# Patient Record
Sex: Female | Born: 1955 | Race: White | Hispanic: No | Marital: Married | Smoking: Former smoker
Health system: Southern US, Community
[De-identification: ages and names within clinical notes are randomized; demographics above are authoritative.]

## PROBLEM LIST (undated history)

## (undated) DIAGNOSIS — F419 Anxiety disorder, unspecified: Secondary | ICD-10-CM

## (undated) DIAGNOSIS — R55 Syncope and collapse: Secondary | ICD-10-CM

## (undated) DIAGNOSIS — Z9289 Personal history of other medical treatment: Secondary | ICD-10-CM

## (undated) DIAGNOSIS — N959 Unspecified menopausal and perimenopausal disorder: Secondary | ICD-10-CM

## (undated) DIAGNOSIS — J302 Other seasonal allergic rhinitis: Secondary | ICD-10-CM

## (undated) DIAGNOSIS — E785 Hyperlipidemia, unspecified: Secondary | ICD-10-CM

## (undated) DIAGNOSIS — M8580 Other specified disorders of bone density and structure, unspecified site: Secondary | ICD-10-CM

## (undated) DIAGNOSIS — K219 Gastro-esophageal reflux disease without esophagitis: Secondary | ICD-10-CM

## (undated) DIAGNOSIS — T7840XA Allergy, unspecified, initial encounter: Secondary | ICD-10-CM

## (undated) DIAGNOSIS — R569 Unspecified convulsions: Secondary | ICD-10-CM

## (undated) HISTORY — DX: Gastro-esophageal reflux disease without esophagitis: K21.9

## (undated) HISTORY — DX: Personal history of other medical treatment: Z92.89

## (undated) HISTORY — DX: Anxiety disorder, unspecified: F41.9

## (undated) HISTORY — DX: Allergy, unspecified, initial encounter: T78.40XA

## (undated) HISTORY — PX: WISDOM TOOTH EXTRACTION: SHX21

## (undated) HISTORY — PX: PARTIAL HYSTERECTOMY: SHX80

## (undated) HISTORY — DX: Other seasonal allergic rhinitis: J30.2

## (undated) HISTORY — DX: Unspecified menopausal and perimenopausal disorder: N95.9

## (undated) HISTORY — DX: Hyperlipidemia, unspecified: E78.5

## (undated) HISTORY — DX: Other specified disorders of bone density and structure, unspecified site: M85.80

## (undated) HISTORY — DX: Syncope and collapse: R55

## (undated) HISTORY — PX: COLONOSCOPY: SHX174

## (undated) HISTORY — DX: Unspecified convulsions: R56.9

---
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
## 1985-08-03 HISTORY — PX: PELVIC LAPAROSCOPY: SHX162

## 1991-08-04 HISTORY — PX: TOTAL ABDOMINAL HYSTERECTOMY: SHX209

## 2005-05-27 ENCOUNTER — Ambulatory Visit: Payer: Self-pay | Admitting: Internal Medicine

## 2005-06-01 ENCOUNTER — Ambulatory Visit: Payer: Self-pay | Admitting: Internal Medicine

## 2005-09-11 ENCOUNTER — Ambulatory Visit: Payer: Self-pay | Admitting: Infectious Diseases

## 2007-06-08 ENCOUNTER — Ambulatory Visit: Payer: Self-pay | Admitting: Internal Medicine

## 2007-10-06 ENCOUNTER — Ambulatory Visit: Payer: Self-pay | Admitting: Internal Medicine

## 2007-10-21 ENCOUNTER — Ambulatory Visit: Payer: Self-pay | Admitting: Internal Medicine

## 2008-02-22 ENCOUNTER — Encounter: Payer: Self-pay | Admitting: Cardiology

## 2008-02-22 ENCOUNTER — Ambulatory Visit: Payer: Self-pay

## 2009-03-22 ENCOUNTER — Ambulatory Visit: Payer: Self-pay | Admitting: Internal Medicine

## 2009-04-01 ENCOUNTER — Ambulatory Visit: Payer: Self-pay | Admitting: Cardiology

## 2009-04-02 DIAGNOSIS — E785 Hyperlipidemia, unspecified: Secondary | ICD-10-CM | POA: Insufficient documentation

## 2009-04-02 DIAGNOSIS — R000 Tachycardia, unspecified: Secondary | ICD-10-CM | POA: Insufficient documentation

## 2009-04-03 HISTORY — PX: OTHER SURGICAL HISTORY: SHX169

## 2009-04-10 ENCOUNTER — Ambulatory Visit: Payer: Self-pay | Admitting: Cardiology

## 2009-04-10 ENCOUNTER — Other Ambulatory Visit: Payer: Self-pay | Admitting: Cardiology

## 2009-04-10 ENCOUNTER — Encounter (INDEPENDENT_AMBULATORY_CARE_PROVIDER_SITE_OTHER): Payer: Self-pay | Admitting: General Surgery

## 2009-04-15 ENCOUNTER — Ambulatory Visit: Payer: Self-pay | Admitting: Cardiology

## 2009-08-03 HISTORY — PX: ANTERIOR CRUCIATE LIGAMENT REPAIR: SHX115

## 2009-10-05 LAB — HM DEXA SCAN

## 2009-10-29 ENCOUNTER — Ambulatory Visit: Payer: Self-pay | Admitting: Orthopedic Surgery

## 2009-10-30 ENCOUNTER — Ambulatory Visit: Payer: Self-pay | Admitting: Internal Medicine

## 2010-01-17 ENCOUNTER — Ambulatory Visit: Payer: Self-pay | Admitting: Orthopedic Surgery

## 2010-01-29 ENCOUNTER — Ambulatory Visit: Payer: Self-pay | Admitting: Orthopedic Surgery

## 2011-03-02 ENCOUNTER — Encounter: Payer: Self-pay | Admitting: Cardiology

## 2011-05-05 ENCOUNTER — Ambulatory Visit (INDEPENDENT_AMBULATORY_CARE_PROVIDER_SITE_OTHER): Payer: BC Managed Care – PPO

## 2011-05-05 DIAGNOSIS — Z23 Encounter for immunization: Secondary | ICD-10-CM

## 2011-05-15 ENCOUNTER — Ambulatory Visit (INDEPENDENT_AMBULATORY_CARE_PROVIDER_SITE_OTHER): Payer: BC Managed Care – PPO | Admitting: Internal Medicine

## 2011-05-15 ENCOUNTER — Encounter: Payer: Self-pay | Admitting: Internal Medicine

## 2011-05-15 VITALS — BP 133/86 | HR 93 | Temp 98.6°F | Resp 16 | Ht 63.0 in | Wt 128.5 lb

## 2011-05-15 DIAGNOSIS — J069 Acute upper respiratory infection, unspecified: Secondary | ICD-10-CM

## 2011-05-15 MED ORDER — AMOXICILLIN-POT CLAVULANATE 200-28.5 MG PO CHEW: 1.0000 | CHEWABLE_TABLET | Freq: Two times a day (BID) | ORAL | Status: DC

## 2011-05-15 MED ORDER — AMOXICILLIN-POT CLAVULANATE 875-125 MG PO TABS: 1.0000 | ORAL_TABLET | Freq: Two times a day (BID) | ORAL | Status: AC

## 2011-05-15 MED ORDER — BENZONATATE 200 MG PO CAPS: 200.0000 mg | ORAL_CAPSULE | Freq: Three times a day (TID) | ORAL | Status: AC | PRN

## 2011-05-15 NOTE — Patient Instructions (Signed)
You can take benadryl at bedtime with your tessalon and your sudafed.  The tessalon can be taken every 8 hours. Avoid dairywhile sick  Can produce extra mucuous.   A probiotic may help prevent diarrhea from the augmentin (antibiotic)

## 2011-05-15 NOTE — Progress Notes (Signed)
  Subjective:    Patient ID: Michele Hess, female    DOB: 10/26/1955, 55 y.o.   MRN: [id number]  HPI  10 day history of sore throat naccompaneid by facial pressure, sinus congestion , persistent cough, discolored nasal drainage and general malaise.,  Sick contacts include her grandchild who is living woth her and attends dayschool.  NO recent travel.  Past Medical History  Diagnosis Date  . History of ETT     myoview 7/09: 9 min 31 sec, EF 71%, nov evidence for ischemia. ETT 99/10)" 1030", excellent exercise tolerance, no abnormal rhythms, no evidence for ischemia  . HLD (hyperlipidemia)    Current Outpatient Prescriptions on File Prior to Visit  Medication Sig Dispense Refill  . Calcium-Vitamin D 600-125 MG-UNIT TABS Take by mouth 2 (two) times daily.        . fexofenadine (ALLEGRA) 180 MG tablet Take 180 mg by mouth daily.        . Multiple Vitamins-Iron (MULTIVITAMIN/IRON) TABS Take by mouth daily.        . NON FORMULARY Estriol 1 mg/ Testosterone .5 mg Vag tab: twice weekly       . NON FORMULARY PROGESTERONE CREAM 5% WITH DHEA 10MG /ML: 1/2 mL 25 days/month       . Omega-3 Fatty Acids (FISH OIL) 1200 MG CAPS Take 2 capsules by mouth daily.            Review of Systems  Constitutional: Negative for fever, chills and unexpected weight change.  HENT: Negative for hearing loss, ear pain, nosebleeds, congestion, sore throat, facial swelling, rhinorrhea, sneezing, mouth sores, trouble swallowing, neck pain, neck stiffness, voice change, postnasal drip, sinus pressure, tinnitus and ear discharge.   Eyes: Negative for pain, discharge, redness and visual disturbance.  Respiratory: Negative for cough, chest tightness, shortness of breath, wheezing and stridor.   Cardiovascular: Negative for chest pain, palpitations and leg swelling.  Musculoskeletal: Negative for myalgias and arthralgias.  Skin: Negative for color change and rash.  Neurological: Negative for dizziness, weakness,  light-headedness and headaches.  Hematological: Negative for adenopathy.       Objective:   Physical Exam  Constitutional: She is oriented to person, place, and time. She appears well-developed and well-nourished.  HENT:  Left Ear: Tympanic membrane is bulging.  Nose: Mucosal edema and rhinorrhea present.  Mouth/Throat: Oropharynx is clear and moist.  Eyes: EOM are normal. Pupils are equal, round, and reactive to light. No scleral icterus.  Neck: Normal range of motion. Neck supple. No JVD present. No thyromegaly present.  Cardiovascular: Normal rate, regular rhythm, normal heart sounds and intact distal pulses.   Pulmonary/Chest: Effort normal and breath sounds normal.  Abdominal: Soft. Bowel sounds are normal. She exhibits no mass. There is no tenderness.  Musculoskeletal: Normal range of motion. She exhibits no edema.  Lymphadenopathy:    She has no cervical adenopathy.  Neurological: She is alert and oriented to person, place, and time.  Skin: Skin is warm and dry.  Psychiatric: She has a normal mood and affect.          Assessment & Plan:  URI:  Given persistence of symptoms for 10 days accompanied by findings of bulging TM,  Will treat with augmentin for sinusitis/otitis, along with decongestant and cough suppressant.

## 2011-09-08 ENCOUNTER — Encounter: Payer: Self-pay | Admitting: Internal Medicine

## 2011-09-09 ENCOUNTER — Encounter: Payer: Self-pay | Admitting: Internal Medicine

## 2011-10-06 ENCOUNTER — Ambulatory Visit (INDEPENDENT_AMBULATORY_CARE_PROVIDER_SITE_OTHER): Payer: BC Managed Care – PPO | Admitting: Internal Medicine

## 2011-10-06 VITALS — BP 102/64 | HR 87 | Temp 97.8°F | Resp 16 | Wt 131.8 lb

## 2011-10-06 DIAGNOSIS — R5383 Other fatigue: Secondary | ICD-10-CM

## 2011-10-06 DIAGNOSIS — E782 Mixed hyperlipidemia: Secondary | ICD-10-CM

## 2011-10-06 DIAGNOSIS — N390 Urinary tract infection, site not specified: Secondary | ICD-10-CM

## 2011-10-06 LAB — POCT URINALYSIS DIPSTICK
Bilirubin, UA: NEGATIVE
Glucose, UA: NEGATIVE
Nitrite, UA: NEGATIVE
Urobilinogen, UA: 0.2

## 2011-10-06 MED ORDER — SULFAMETHOXAZOLE-TRIMETHOPRIM 800-160 MG PO TABS: 1.0000 | ORAL_TABLET | Freq: Two times a day (BID) | ORAL | Status: AC

## 2011-10-06 NOTE — Progress Notes (Signed)
Subjective:    Patient ID: Michele Hess, female    DOB: 06-13-1956, 56 y.o.   MRN: [id number]  HPI  Michele Hess is a healthy active 56 yo menopausal woman on Vaginal estrogen  For postmenopasual atrophic vaginitis who presents with 2 day history of dysuria and frequency.  Denies fevers, chills, back pain and N/V.  No gross blood.   Past Medical History  Diagnosis Date  . History of ETT     myoview 7/09: 9 min 31 sec, EF 71%, nov evidence for ischemia. ETT 99/10)" 1030", excellent exercise tolerance, no abnormal rhythms, no evidence for ischemia  . HLD (hyperlipidemia)    Current Outpatient Prescriptions on File Prior to Visit  Medication Sig Dispense Refill  . Calcium-Vitamin D 600-125 MG-UNIT TABS Take by mouth 2 (two) times daily.        . fexofenadine (ALLEGRA) 180 MG tablet Take 180 mg by mouth daily.        . Multiple Vitamins-Iron (MULTIVITAMIN/IRON) TABS Take by mouth daily.        . NON FORMULARY Estriol 1 mg/ Testosterone .5 mg Vag tab: twice weekly       . NON FORMULARY PROGESTERONE CREAM 5% WITH DHEA 10MG /ML: 1/2 mL 25 days/month       . Omega-3 Fatty Acids (FISH OIL) 1200 MG CAPS Take 2 capsules by mouth daily.            Review of Systems  Constitutional: Negative for fever, chills and unexpected weight change.  HENT: Negative for hearing loss, ear pain, nosebleeds, congestion, sore throat, facial swelling, rhinorrhea, sneezing, mouth sores, trouble swallowing, neck pain, neck stiffness, voice change, postnasal drip, sinus pressure, tinnitus and ear discharge.   Eyes: Negative for pain, discharge, redness and visual disturbance.  Respiratory: Negative for cough, chest tightness, shortness of breath, wheezing and stridor.   Cardiovascular: Negative for chest pain, palpitations and leg swelling.  Genitourinary: Positive for dysuria and urgency.  Musculoskeletal: Negative for myalgias and arthralgias.  Skin: Negative for color change and rash.  Neurological: Negative for  dizziness, weakness, light-headedness and headaches.  Hematological: Negative for adenopathy.       Objective:   Physical Exam  Constitutional: She appears well-developed and well-nourished.  Neck: Normal range of motion. Neck supple.  Cardiovascular: Normal rate and regular rhythm.   Pulmonary/Chest: Effort normal and breath sounds normal.  Abdominal: Soft. Bowel sounds are normal.       Assessment & Plan:   Urinary tract infection Will treat empirically with Septra.  Avoiding ciprofloxacin  given patients daily workouts.  HYPERLIPIDEMIA, MIXED Mild., managed with fish oil and diet.  she will return this summer for repeat fasting labs    Updated Medication List Outpatient Encounter Prescriptions as of 10/06/2011  Medication Sig Dispense Refill  . Calcium-Vitamin D 600-125 MG-UNIT TABS Take by mouth 2 (two) times daily.        . fexofenadine (ALLEGRA) 180 MG tablet Take 180 mg by mouth daily.        . Multiple Vitamins-Iron (MULTIVITAMIN/IRON) TABS Take by mouth daily.        . NON FORMULARY Estriol 1 mg/ Testosterone .5 mg Vag tab: twice weekly       . NON FORMULARY PROGESTERONE CREAM 5% WITH DHEA 10MG /ML: 1/2 mL 25 days/month       . Omega-3 Fatty Acids (FISH OIL) 1200 MG CAPS Take 2 capsules by mouth daily.        Marland Kitchen sulfamethoxazole-trimethoprim (SEPTRA DS) 800-160 MG per  tablet Take 1 tablet by mouth 2 (two) times daily.  6 tablet  0

## 2011-10-07 NOTE — Assessment & Plan Note (Signed)
Will treat empirically with Septra.  Avoiding ciprofloxacin  given patients daily workouts.

## 2011-10-07 NOTE — Assessment & Plan Note (Addendum)
Mild., managed with fish oil and diet.  she will return this summer for repeat fasting labs

## 2011-10-09 LAB — URINE CULTURE: Colony Count: >=100000

## 2011-12-07 ENCOUNTER — Other Ambulatory Visit: Payer: Self-pay | Admitting: Internal Medicine

## 2012-02-02 ENCOUNTER — Telehealth: Payer: Self-pay | Admitting: Internal Medicine

## 2012-02-02 DIAGNOSIS — Z1239 Encounter for other screening for malignant neoplasm of breast: Secondary | ICD-10-CM

## 2012-02-02 DIAGNOSIS — E785 Hyperlipidemia, unspecified: Secondary | ICD-10-CM

## 2012-02-02 DIAGNOSIS — Z79899 Other long term (current) drug therapy: Secondary | ICD-10-CM

## 2012-02-02 NOTE — Telephone Encounter (Signed)
I have faxed order to [facility] for her mammogram.

## 2012-02-02 NOTE — Telephone Encounter (Signed)
Orders in EPIC.  Pls schedule mammogram

## 2012-02-02 NOTE — Telephone Encounter (Signed)
spoke with Anne Hahn at [facility]/July 18 at 8:30--02-02-12 left msg with spouse asking to have patient return my call.

## 2012-02-02 NOTE — Telephone Encounter (Signed)
When she calls back I will also try to schedule her lab appointment with her.

## 2012-02-02 NOTE — Telephone Encounter (Signed)
Patient returned your call she was notified of her appointment date with [facility] for her mammogram and scheduled her lab appointment for her physical.

## 2012-02-02 NOTE — Telephone Encounter (Signed)
Patient would like to know if she could get her lab work and mammogram done before her physical on Aug. 8.  She uses [facility] for her mammogram.

## 2012-02-23 ENCOUNTER — Encounter: Payer: Self-pay | Admitting: Internal Medicine

## 2012-03-02 ENCOUNTER — Other Ambulatory Visit: Payer: BC Managed Care – PPO

## 2012-03-04 ENCOUNTER — Other Ambulatory Visit (INDEPENDENT_AMBULATORY_CARE_PROVIDER_SITE_OTHER): Payer: BC Managed Care – PPO | Admitting: *Deleted

## 2012-03-04 DIAGNOSIS — R5383 Other fatigue: Secondary | ICD-10-CM

## 2012-03-04 DIAGNOSIS — R5381 Other malaise: Secondary | ICD-10-CM

## 2012-03-04 DIAGNOSIS — E782 Mixed hyperlipidemia: Secondary | ICD-10-CM

## 2012-03-04 DIAGNOSIS — Z79899 Other long term (current) drug therapy: Secondary | ICD-10-CM

## 2012-03-04 LAB — CBC WITH DIFFERENTIAL/PLATELET
Basophils Absolute: 0 10*3/uL (ref 0.0–0.1)
Basophils Relative: 0.5 % (ref 0.0–3.0)
Eosinophils Relative: 1.4 % (ref 0.0–5.0)
Hemoglobin: 14 g/dL (ref 12.0–15.0)
Lymphocytes Relative: 39.2 % (ref 12.0–46.0)
Lymphs Abs: 2 10*3/uL (ref 0.7–4.0)
MCHC: 33.5 g/dL (ref 30.0–36.0)
MCV: 92.1 fl (ref 78.0–100.0)
Neutro Abs: 2.6 10*3/uL (ref 1.4–7.7)
Neutrophils Relative %: 49.9 % (ref 43.0–77.0)
RBC: 4.55 Mil/uL (ref 3.87–5.11)
WBC: 5.2 10*3/uL (ref 4.5–10.5)

## 2012-03-04 LAB — LIPID PANEL
Cholesterol: 196 mg/dL (ref 0–200)
HDL: 65.8 mg/dL (ref >39.00)
LDL Cholesterol: 109 mg/dL — ABNORMAL HIGH (ref 0–99)
Total CHOL/HDL Ratio: 3
Triglycerides: 106 mg/dL (ref 0.0–149.0)
VLDL: 21.2 mg/dL (ref 0.0–40.0)

## 2012-03-05 LAB — COMPLETE METABOLIC PANEL WITH GFR
ALT: <8 U/L (ref 0–35)
AST: 19 U/L (ref 0–37)
BUN: 21 mg/dL (ref 6–23)
Calcium: 9.5 mg/dL (ref 8.4–10.5)
Creat: 0.67 mg/dL (ref 0.50–1.10)
Total Bilirubin: 0.5 mg/dL (ref 0.3–1.2)

## 2012-03-07 ENCOUNTER — Other Ambulatory Visit: Payer: BC Managed Care – PPO

## 2012-03-09 ENCOUNTER — Encounter: Payer: BC Managed Care – PPO | Admitting: Internal Medicine

## 2012-03-11 ENCOUNTER — Ambulatory Visit (INDEPENDENT_AMBULATORY_CARE_PROVIDER_SITE_OTHER): Payer: BC Managed Care – PPO | Admitting: Internal Medicine

## 2012-03-11 ENCOUNTER — Encounter: Payer: Self-pay | Admitting: Internal Medicine

## 2012-03-11 VITALS — BP 132/78 | HR 65 | Temp 98.0°F | Resp 16 | Wt 128.2 lb

## 2012-03-11 DIAGNOSIS — N959 Unspecified menopausal and perimenopausal disorder: Secondary | ICD-10-CM

## 2012-03-11 DIAGNOSIS — T781XXA Other adverse food reactions, not elsewhere classified, initial encounter: Secondary | ICD-10-CM

## 2012-03-11 DIAGNOSIS — Z0000 Encounter for general adult medical examination without abnormal findings: Secondary | ICD-10-CM

## 2012-03-11 DIAGNOSIS — Z124 Encounter for screening for malignant neoplasm of cervix: Secondary | ICD-10-CM

## 2012-03-11 DIAGNOSIS — Z91018 Allergy to other foods: Secondary | ICD-10-CM

## 2012-03-11 DIAGNOSIS — T783XXA Angioneurotic edema, initial encounter: Secondary | ICD-10-CM

## 2012-03-11 DIAGNOSIS — E782 Mixed hyperlipidemia: Secondary | ICD-10-CM

## 2012-03-11 NOTE — Patient Instructions (Addendum)
Consider a Low Glycemic Index Diet and eating 6 smaller meals daily .  This frequent feeding stimulates your metabolism and the lower glycemic index foods will lower your blood sugars:   This is an example of my daily  "Low GI"  Diet:  All of the foods can be found at grocery stores and in bulk at [employer]  club   7 AM Breakfast:  Low carbohydrate Protein  Shakes (I recommend the EAS AdvantEdge "Carb Control" shakes  Or the low carb shakes by Atkins.   Both are available everywhere:  In  cases at [employer]  Or in 4 packs at grocery stores and pharmacies  2.5 carbs  (Alternative is  a toasted Arnold's Sandwhich Thin w/ peanut butter, a "Bagel Thin" with cream cheese and salmon) or  a scrambled egg burrito made with a low carb tortilla .  Avoid cereal and bananas, oatmeal too unless the old fashioned kind that takes 30-40 minutes to prepare.  the rest is overly processed, has minimal fiber, and loaded with carbohydrates!   10 AM: Protein bar by Atkins (the snack size, under 200 cal.  There are many varieties , available widely again or in bulk in limited varieties at [employer]Js)  Other so called "protein bars" tend to be loaded with carbohydrates.  Remember, in food advertising, the word "energy" is synonymous for " carbohydrate."  Lunch: sandwich of turkey, (or any lunchmeat or canned tuna), fresh avocado and cheese on a lower carbohydrate pita bread, flatbread, or tortilla . Ok to use mayonnaise. The bread is the only source or carbohydrate that can be decreased (Joseph's makes a pita bread and a flat bread  Are 50 cal and 4 net carbs ; Toufayan makes a low carb flatbread 100 cal and 9 net carbs  and  Mission makes a low carb whole wheat tortilla  210 cal and 6 net carbs)  3 PM:  Mid day :  Another protein bar,  Or a  cheese stick (100 cal, 0 carbs),  Or 1 ounce of  almonds, walnuts, pistachios, pecans, peanuts,  Macadamia nuts. Or a Dannon light n Fit greek yogurt, 80 cal 8 net carbs . Avoid "granola"; the dried  cranberries and raisins are loaded with carbohydrates.    6 PM  Dinner:  "mean and green:"  Meat/chicken/fish or a high protein legume; , with a green salad, and a low GI  Veggie (broccoli, cauliflower, green beans, spinach, brussel sprouts. Lima beans) : Avoid "Low fat dressings, [place] and [place]! They are loaded with sugar! Instead use ranch, vinagrette,  Blue cheese, etc  9 PM snack : Breyer's "low carb" fudgsicle or  ice cream bar (Carb Smart line), or  Weight Watcher's ice cream bar , or anouther "no sugar added" ice cream; or another protein shake or a serving of fresh fruit with whipped cream (Avoid bananas, pineapple, grapes  and watermelon on a regular basis because they are high in sugar)   Remember that snack Substitutions should be less than 15 to 20 carbs  Per serving. Remember to subtract fiber grams to get the "net carbs." 

## 2012-03-11 NOTE — Progress Notes (Signed)
Patient ID: Michele Hess, female   DOB: 1956/01/05, 56 y.o.   MRN: [id number]  Subjective:    Michele Hess is a 56 y.o. female who presents for an annual exam.  S/p TAH,  But wasn't sure prior to exam if she had a cervix  Followup on hyperlipidemia..  Recent labs done,  LDL has dropped from from 578 to 106 attributed to exercise program, She is  training for a run. ,  Mammogram normal per patient July 2013,  Still waiting for report. Had annual derm exam..  Skin tag taken off under right axilla.   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 It has never occurred at home. Only at restaurants. She felt that her throat was closing up however she did not have trouble breathing. She's had no prior allergy testing. The patient is sexually active. GYN screening history: last pap: patient does not recall when last pap was. The patient wears seatbelts: yes. The patient participates in regular exercise: yes. Has the patient ever been transfused or tattooed?: no. The patient reports that there is not domestic violence in her life.   Menstrual History: OB History    Grav Para Term Preterm Abortions TAB SAB Ect Mult Living                  Menarche age: 68 No LMP recorded. Patient has had a hysterectomy.    The following portions of the patient's history were reviewed and updated as appropriate: allergies, current medications, past family history, past medical history, past social history, past surgical history and problem list.  Review of Systems A comprehensive review of systems was negative except for: Allergic/Immunologic: positive for angioedema    Objective:    BP 132/78  Pulse 65  Temp 98 F (36.7 C) (Oral)  Resp 16  Wt 128 lb 4 oz (58.174 kg)  SpO2 96%  General Appearance:    Alert, cooperative, no distress, appears stated age  Head:    Normocephalic, without obvious abnormality, atraumatic  Eyes:     PERRL, conjunctiva/corneas clear, EOM's intact, fundi    benign, both eyes  Ears:    Normal TM's and external ear canals, both ears  Nose:   Nares normal, septum midline, mucosa normal, no drainage    or sinus tenderness  Throat:   Lips, mucosa, and tongue normal; teeth and gums normal  Neck:   Supple, symmetrical, trachea midline, no adenopathy;    thyroid:  no enlargement/tenderness/nodules; no carotid   bruit or JVD  Back:     Symmetric, no curvature, ROM normal, no CVA tenderness  Lungs:     Clear to auscultation bilaterally, respirations unlabored  Chest Wall:    No tenderness or deformity   Heart:    Regular rate and rhythm, S1 and S2 normal, no murmur, rub   or gallop  Breast Exam:    No tenderness, masses, or nipple abnormality  Abdomen:     Soft, non-tender, bowel sounds active all four quadrants,    no masses, no organomegaly  Genitalia:    Normal female without lesion, discharge or tenderness. Cervix absent, ovaries nonpalpable.     Extremities:   Extremities normal, atraumatic, no cyanosis or edema  Pulses:   2+ and symmetric all extremities  Skin:   Skin color, texture, turgor normal, no rashes or lesions  Lymph nodes:   Cervical, supraclavicular, and axillary nodes  normal  Neurologic:   CNII-XII intact, normal strength, sensation and reflexes    throughout  .    Assessment:   Menopausal and postmenopausal disorder Management bioidentical hormone therapy which I have continued to refill but not adjust.  HYPERLIPIDEMIA, MIXED Excellent reduction in LDL from 160 to 100 with diet and exercise.  Routine general medical examination at a health care facility Breast pelvic were done today.  She has no cervix, so no PAP was done.   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 I recommended that she or eating spicy foods until she can have her allergy food allergies tested. I  have recommended that she take a Zyrtec or Allegra before she goes out to eat as it certainly would not hurt.   Updated Medication List Outpatient Encounter Prescriptions as of 03/11/2012  Medication Sig Dispense Refill  . Calcium-Vitamin D 600-125 MG-UNIT TABS Take by mouth 2 (two) times daily.        . fexofenadine (ALLEGRA) 180 MG tablet Take 180 mg by mouth daily.        . Multiple Vitamins-Iron (MULTIVITAMIN/IRON) TABS Take by mouth daily.        . NON FORMULARY Estriol 1 mg/ Testosterone .5 mg Vag tab: twice weekly       . NON FORMULARY PROGESTERONE CREAM 5% WITH DHEA 10MG /ML: 1/2 mL 25 days/month       . Omega-3 Fatty Acids (FISH OIL) 1200 MG CAPS Take 2 capsules by mouth daily.

## 2012-03-13 ENCOUNTER — Encounter: Payer: Self-pay | Admitting: Internal Medicine

## 2012-03-13 DIAGNOSIS — T783XXA Angioneurotic edema, initial encounter: Secondary | ICD-10-CM | POA: Insufficient documentation

## 2012-03-13 NOTE — Assessment & Plan Note (Signed)
Breast Pap and pelvic were done today

## 2012-03-13 NOTE — Assessment & Plan Note (Signed)
Management bioidentical hormone therapy which I have continued to refill but not adjust.

## 2012-03-13 NOTE — Assessment & Plan Note (Signed)
She has describes several episodes of allergic reaction to spicy peppers which involve a feeling of her throat closing up. I recommended that she or eating spicy foods until she can have her allergy food allergies tested. I have recommended that she take a Zyrtec or Allegra before she goes out to eat as it certainly would not hurt.

## 2012-03-13 NOTE — Assessment & Plan Note (Signed)
Excellent reduction in LDL from 160 to 100 with diet and exercise.

## 2012-03-14 ENCOUNTER — Other Ambulatory Visit (HOSPITAL_COMMUNITY)
Admission: RE | Admit: 2012-03-14 | Discharge: 2012-03-14 | Disposition: A | Discharge status: Home / Self Care | Payer: BC Managed Care – PPO | Source: Ambulatory Visit | Attending: Internal Medicine | Admitting: Internal Medicine

## 2012-03-14 DIAGNOSIS — Z1151 Encounter for screening for human papillomavirus (HPV): Secondary | ICD-10-CM | POA: Insufficient documentation

## 2012-03-14 DIAGNOSIS — Z01419 Encounter for gynecological examination (general) (routine) without abnormal findings: Secondary | ICD-10-CM | POA: Insufficient documentation

## 2012-03-14 NOTE — Addendum Note (Signed)
Addended by: Jobie Quaker on: 03/14/2012 09:26 AM   Modules accepted: Orders

## 2012-03-23 LAB — HM PAP SMEAR: HM Pap smear: NORMAL

## 2012-04-11 ENCOUNTER — Other Ambulatory Visit: Payer: Self-pay | Admitting: Internal Medicine

## 2012-04-26 ENCOUNTER — Other Ambulatory Visit: Payer: Self-pay | Admitting: *Deleted

## 2012-04-26 MED ORDER — FEXOFENADINE HCL 180 MG PO TABS: 180.0000 mg | ORAL_TABLET | Freq: Every day | ORAL | Status: DC

## 2012-04-27 ENCOUNTER — Telehealth: Payer: Self-pay | Admitting: Internal Medicine

## 2012-04-27 MED ORDER — FEXOFENADINE HCL 180 MG PO TABS: 180.0000 mg | ORAL_TABLET | Freq: Every day | ORAL | Status: DC

## 2012-04-27 NOTE — Telephone Encounter (Signed)
[facility] Fexofenadine 180 mg tab 30 each prescribed refills 7 date written 08-31-10 take 1 tablet every day date last filled 06-10-11

## 2012-05-09 ENCOUNTER — Encounter: Payer: Self-pay | Admitting: Internal Medicine

## 2012-07-07 ENCOUNTER — Other Ambulatory Visit: Payer: Self-pay

## 2012-07-07 NOTE — Telephone Encounter (Signed)
Entered in error

## 2012-07-11 ENCOUNTER — Telehealth: Payer: Self-pay

## 2012-07-11 NOTE — Telephone Encounter (Signed)
Refill request from [facility] for E3 1mg  + Test 0.5 mg vag. Ok to refill?

## 2012-07-11 NOTE — Telephone Encounter (Signed)
Yes, it is a compound so best to refill it verbally with 11 refills

## 2012-07-13 ENCOUNTER — Other Ambulatory Visit: Payer: Self-pay

## 2012-07-13 NOTE — Telephone Encounter (Signed)
Left message for patient to call me back on clarification on where to send Rx to.

## 2012-07-14 ENCOUNTER — Other Ambulatory Visit: Payer: Self-pay

## 2012-09-17 ENCOUNTER — Other Ambulatory Visit: Payer: Self-pay

## 2013-02-20 LAB — HM MAMMOGRAPHY: HM MAMMO: NORMAL

## 2013-02-28 ENCOUNTER — Telehealth: Payer: Self-pay | Admitting: *Deleted

## 2013-02-28 NOTE — Telephone Encounter (Signed)
Refill Request  E3 1mg +Test 0.5 mg Vag tab  Insert one tablet vaginally twice a week

## 2013-03-01 NOTE — Telephone Encounter (Signed)
Last OV 8/13 and last PAP 8/13 please advise as to refill?

## 2013-03-01 NOTE — Telephone Encounter (Signed)
Ok to refill for 30 days and 1 refills

## 2013-03-08 NOTE — Telephone Encounter (Signed)
Called in.

## 2013-03-14 ENCOUNTER — Telehealth: Payer: Self-pay | Admitting: *Deleted

## 2013-03-14 NOTE — Telephone Encounter (Signed)
Patient would like to know if she is due any labs at this time.

## 2013-03-15 NOTE — Telephone Encounter (Signed)
No, her labs were all normal last year and she is nto taking any medications that I know of that require monitoring

## 2013-03-15 NOTE — Telephone Encounter (Signed)
Pt notified on personalized voicemail.  

## 2013-04-23 LAB — HM MAMMOGRAPHY: HM Mammogram: NORMAL

## 2013-05-05 ENCOUNTER — Encounter: Payer: Self-pay | Admitting: Internal Medicine

## 2013-06-06 ENCOUNTER — Ambulatory Visit (INDEPENDENT_AMBULATORY_CARE_PROVIDER_SITE_OTHER): Payer: BC Managed Care – PPO | Admitting: Internal Medicine

## 2013-06-06 ENCOUNTER — Encounter: Payer: Self-pay | Admitting: Internal Medicine

## 2013-06-06 ENCOUNTER — Telehealth: Payer: Self-pay | Admitting: Internal Medicine

## 2013-06-06 VITALS — BP 118/70 | HR 78 | Temp 98.0°F | Wt 130.0 lb

## 2013-06-06 DIAGNOSIS — R300 Dysuria: Secondary | ICD-10-CM

## 2013-06-06 DIAGNOSIS — E559 Vitamin D deficiency, unspecified: Secondary | ICD-10-CM

## 2013-06-06 DIAGNOSIS — M8580 Other specified disorders of bone density and structure, unspecified site: Secondary | ICD-10-CM

## 2013-06-06 DIAGNOSIS — M25552 Pain in left hip: Secondary | ICD-10-CM

## 2013-06-06 DIAGNOSIS — M25559 Pain in unspecified hip: Secondary | ICD-10-CM

## 2013-06-06 DIAGNOSIS — R5381 Other malaise: Secondary | ICD-10-CM

## 2013-06-06 DIAGNOSIS — M899 Disorder of bone, unspecified: Secondary | ICD-10-CM

## 2013-06-06 LAB — POCT URINALYSIS DIPSTICK
Glucose, UA: NEGATIVE
Ketones, UA: NEGATIVE
Nitrite, UA: NEGATIVE
Spec Grav, UA: <=1.005
Urobilinogen, UA: 0.2

## 2013-06-06 MED ORDER — METHOCARBAMOL 500 MG PO TABS: 500.0000 mg | ORAL_TABLET | Freq: Four times a day (QID) | ORAL | Status: DC

## 2013-06-06 NOTE — Telephone Encounter (Signed)
Pt called thinks she has it and wanted to come by to leave urine sample She has side pain   Discoloration of urine

## 2013-06-06 NOTE — Patient Instructions (Addendum)
You have no signs of UTI   You can take up to 800 mg ibuprofen every 8 hours if needed for pain , you can add 500 mg tylenol (acetominophen) every 6 hours (maximum 2000 mg daily) for better pain control.  We will order your bone density test , your last one was March 2011 and your  T scores were in the moderate osteopenia range  You can add a muscle relaxer if needed  You should take 2000 units of vitamin d through the winter months,  And 1000 units during the rest of the year (unless your level is low)

## 2013-06-06 NOTE — Telephone Encounter (Signed)
Dr. Darrick Huntsman will see today at 4:30

## 2013-06-06 NOTE — Progress Notes (Signed)
Patient ID: Michele Hess, female   DOB: May 22, 1956, 57 y.o.   MRN: [id number]   Patient Active Problem List   Diagnosis Date Noted  . Left hip pain 06/07/2013  . Osteopenia 06/07/2013  . 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 Routine general medical examination at a health care facility 03/11/2012  . Menopausal and postmenopausal disorder   . HYPERLIPIDEMIA, MIXED 04/02/2009  . TACHYCARDIA 04/02/2009    Subjective:  CC:   Chief Complaint  Patient presents with  . Urinary Tract Infection    HPI:   Michele Hess a 57 y.o. female who presents for Left CVA pain intermittent for the last month. She requested treatment for UTI and was offered an appt for evaluation.  Has been having left hip pain and recently developed intermittent lateral back pain .  She is running over 6 miles daily   Training for a half marathon,  History of djd left hip noted by chiropractor.    Past Medical History  Diagnosis Date  . History of ETT     myoview 7/09: 9 min 31 sec, EF 71%, nov evidence for ischemia. ETT 99/10)" 1030", excellent exercise tolerance, no abnormal rhythms, no evidence for ischemia  . HLD (hyperlipidemia)   . Menopausal and postmenopausal disorder     Past Surgical History  Procedure Laterality Date  . Partial hysterectomy    . Echo (other)  9/10    EF 60%, lv normal, RV normal, mild MR, trivial pericardial effusion   . Total abdominal hysterectomy  1993    [facility]       The following portions of the patient's history were reviewed and updated as appropriate: Allergies, current medications, and problem list.    Review of Systems:   12 Pt  review of systems was negative except those addressed in the HPI,     History   Social History  . Marital Status: Married    Spouse Name: N/A    Number of Children: N/A  . Years of Education: N/A   Occupational History  . Not on file.   Social History Main Topics  . Smoking status: Former Smoker    Quit date: 05/14/2009   . Smokeless tobacco: Never Used     Comment: quit 15 years ago   . Alcohol Use: Yes     Comment: rare  . Drug Use: No  . Sexual Activity: Yes    Birth Control/ Protection: Post-menopausal   Other Topics Concern  . 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 Lives in [place].     Objective:  Filed Vitals:   06/06/13 1620  BP: 118/70  Pulse: 78  Temp: 98 F (36.7 C)     General appearance: alert, cooperative and appears stated age Ears: normal TM's and external ear canals both ears Throat: lips, mucosa, and tongue normal; teeth and gums normal Neck: no adenopathy, no carotid bruit, supple, symmetrical, trachea midline and thyroid not enlarged, symmetric, no tenderness/mass/nodules Back: symmetric, no curvature. ROM normal. No CVA tenderness. Lungs: clear to auscultation bilaterally Heart: regular rate and rhythm, S1, S2 normal, no murmur, click, rub or gallop Abdomen: soft, non-tender; bowel sounds normal; no masses,  no organomegaly Pulses: 2+ and symmetric Skin: Skin color, texture, turgor normal. No rashes or lesions Lymph nodes: Cervical, supraclavicular, and axillary nodes normal.  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  Ibuprofen and tylenol prn.  Muscle relaxer prn   Osteopenia t scores -2.0 in 2011.   Repeat DEXA ordered.   A total of 30 minutes was spent with patient more than half of which was spent in counseling, reviewing records from other prviders and coordination of care.  Updated Medication List Outpatient Encounter Prescriptions as of 06/06/2013  Medication Sig  . Calcium-Vitamin D 600-125 MG-UNIT TABS Take by mouth 2 (two) times daily.    . Multiple Vitamins-Iron (MULTIVITAMIN/IRON) TABS Take by mouth daily.    . NON FORMULARY Estriol 1 mg/ Testosterone .5 mg Vag tab: twice weekly   . NON FORMULARY  PROGESTERONE CREAM 5% WITH DHEA 10MG /ML: 1/2 mL 25 days/month   . Omega-3 Fatty Acids (FISH OIL) 1200 MG CAPS Take 2 capsules by mouth daily.    . methocarbamol (ROBAXIN) 500 MG tablet Take 1 tablet (500 mg total) by mouth 4 (four) times daily.  . [DISCONTINUED] fexofenadine (ALLEGRA) 180 MG tablet Take 1 tablet (180 mg total) by mouth daily.     Orders Placed This Encounter  Procedures  . DG Bone Density  . HM DEXA SCAN  . TSH  . Comprehensive metabolic panel  . Vit D  25 hydroxy (rtn osteoporosis monitoring)  . POCT urinalysis dipstick    No Follow-up on file.

## 2013-06-07 ENCOUNTER — Encounter: Payer: Self-pay | Admitting: Internal Medicine

## 2013-06-07 DIAGNOSIS — M25552 Pain in left hip: Secondary | ICD-10-CM | POA: Insufficient documentation

## 2013-06-07 DIAGNOSIS — M8580 Other specified disorders of bone density and structure, unspecified site: Secondary | ICD-10-CM | POA: Insufficient documentation

## 2013-06-07 LAB — COMPREHENSIVE METABOLIC PANEL
Alkaline Phosphatase: 46 U/L (ref 39–117)
BUN: 18 mg/dL (ref 6–23)
CO2: 28 mEq/L (ref 19–32)
Creatinine, Ser: 0.7 mg/dL (ref 0.4–1.2)
GFR: 96.61 mL/min (ref >60.00)
Glucose, Bld: 97 mg/dL (ref 70–99)
Potassium: 4.1 mEq/L (ref 3.5–5.1)
Sodium: 138 mEq/L (ref 135–145)
Total Bilirubin: 0.5 mg/dL (ref 0.3–1.2)
Total Protein: 7.9 g/dL (ref 6.0–8.3)

## 2013-06-07 LAB — VITAMIN D 25 HYDROXY (VIT D DEFICIENCY, FRACTURES): Vit D, 25-Hydroxy: 71 ng/mL (ref 30–89)

## 2013-06-07 LAB — TSH: TSH: 1.2 u[IU]/mL (ref 0.35–5.50)

## 2013-06-07 NOTE — Assessment & Plan Note (Signed)
t scores -2.0 in 2011.   Repeat DEXA ordered.

## 2013-06-07 NOTE — Assessment & Plan Note (Addendum)
Lenghty discuss about etiology of her low back pain and hip pain Secondary to DJD aggravated by running.  Ibuprofen and tylenol prn.  Muscle relaxer prn

## 2013-06-08 ENCOUNTER — Encounter: Payer: Self-pay | Admitting: Internal Medicine

## 2013-06-08 ENCOUNTER — Other Ambulatory Visit: Payer: Self-pay

## 2013-07-05 ENCOUNTER — Ambulatory Visit: Payer: Self-pay | Admitting: Internal Medicine

## 2013-07-07 ENCOUNTER — Telehealth: Payer: Self-pay | Admitting: *Deleted

## 2013-07-07 NOTE — Telephone Encounter (Signed)
Refill Request    E3 1mg  +Test 0.5 mg vag tab

## 2013-07-07 NOTE — Telephone Encounter (Signed)
Phoned to pharmacy 

## 2013-07-13 ENCOUNTER — Telehealth: Payer: Self-pay | Admitting: Internal Medicine

## 2013-07-13 DIAGNOSIS — M8580 Other specified disorders of bone density and structure, unspecified site: Secondary | ICD-10-CM

## 2013-07-13 LAB — HM DEXA SCAN

## 2013-07-13 NOTE — Telephone Encounter (Signed)
Patient notified

## 2013-07-13 NOTE — Telephone Encounter (Signed)
DEXA scan received.  Osteopenia is improving,  Continue current medications/supplements and repeat in 2 years

## 2013-07-24 ENCOUNTER — Telehealth: Payer: Self-pay | Admitting: *Deleted

## 2013-07-24 ENCOUNTER — Telehealth: Payer: Self-pay | Admitting: Internal Medicine

## 2013-07-24 NOTE — Telephone Encounter (Signed)
Pt requesting refill of her compounded cream: Progesterone 5%/DHEA 10 mg/mL HRT #30 Apply 1/2 mL daily to twice a day 25 days each month  Ok to refill?

## 2013-07-24 NOTE — Telephone Encounter (Signed)
Patient called in after talking with Becky this morning about her refill request. She states this is a constant issues that she faces when it comes to her compound prescription. She was advised to call the pharmacy to have them fax over the request for this medication. She states that they told her they had manually faxed to [place] last week and they were happy to fax again however the patient said "NO" if it has already been faxed I will call and contact the office manager. I have called [facility] at [phone] and spoke with Sue Lush and she is going to fax this prescription attn: shannon so we can get this taken care of. When this refill request gets sent over please handle today so patient can get her cream before the holiday.

## 2013-07-24 NOTE — Telephone Encounter (Signed)
Pt left VM, requesting refill on her compounded cream to [facility]. Left message for pt, to contact pharmacy and request refill request to be sent to our office.

## 2013-07-25 NOTE — Telephone Encounter (Signed)
Yes ok to call in refills on the compounded cream.  thanks

## 2013-07-25 NOTE — Telephone Encounter (Signed)
Called to pharmacy 

## 2013-08-14 ENCOUNTER — Encounter: Payer: Self-pay | Admitting: Internal Medicine

## 2013-10-17 ENCOUNTER — Ambulatory Visit (INDEPENDENT_AMBULATORY_CARE_PROVIDER_SITE_OTHER): Payer: BC Managed Care – PPO | Admitting: Internal Medicine

## 2013-10-17 ENCOUNTER — Encounter: Payer: Self-pay | Admitting: Internal Medicine

## 2013-10-17 VITALS — BP 128/78 | HR 77 | Temp 97.4°F | Resp 16 | Wt 132.2 lb

## 2013-10-17 DIAGNOSIS — N390 Urinary tract infection, site not specified: Secondary | ICD-10-CM

## 2013-10-17 LAB — POCT URINALYSIS DIPSTICK
BILIRUBIN UA: NEGATIVE
Glucose, UA: NEGATIVE
KETONES UA: NEGATIVE
Nitrite, UA: NEGATIVE
PROTEIN UA: NEGATIVE
Spec Grav, UA: 1.01
Urobilinogen, UA: 0.2
pH, UA: 7

## 2013-10-17 MED ORDER — SULFAMETHOXAZOLE-TRIMETHOPRIM 800-160 MG PO TABS: 1.0000 | ORAL_TABLET | Freq: Two times a day (BID) | ORAL | Status: DC

## 2013-10-17 NOTE — Progress Notes (Signed)
Pre-visit discussion using our clinic review tool. No additional management support is needed unless otherwise documented below in the visit note.  

## 2013-10-17 NOTE — Progress Notes (Signed)
Patient ID: Michele Hess, female   DOB: 1955-08-08, 58 y.o.   MRN: [id number]  Patient Active Problem List   Diagnosis Date Noted  . UTI (urinary tract infection) 10/17/2013  . Left hip pain 06/07/2013  . Osteopenia 06/07/2013  . 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 Routine general medical examination at a health care facility 03/11/2012  . Menopausal and postmenopausal disorder   . HYPERLIPIDEMIA, MIXED 04/02/2009  . TACHYCARDIA 04/02/2009    Subjective:  CC:   Chief Complaint  Patient presents with  . Acute Visit    Burning constant  . Urinary Tract Infection    HPI:   Michele Hess is a 58 y.o. female who presents for  Dysuria.  Symptoms started 2 nights ago , improved transiently with increased water  But recurred after 24 hours now worse.  Not accompanied by nausea,  Fevers or back pain.  Has not taken antibiotics in the last 3 months. No recent long trips, use of swimming pools or hot tubs.      Past Medical History  Diagnosis Date  . History of ETT     myoview 7/09: 9 min 31 sec, EF 71%, nov evidence for ischemia. ETT 99/10)" 1030", excellent exercise tolerance, no abnormal rhythms, no evidence for ischemia  . HLD (hyperlipidemia)   . Menopausal and postmenopausal disorder     Past Surgical History  Procedure Laterality Date  . Partial hysterectomy    . Echo (other)  9/10    EF 60%, lv normal, RV normal, mild MR, trivial pericardial effusion   . Total abdominal hysterectomy  1993    Michele Hess       The following portions of the patient's history were reviewed and updated as appropriate: Allergies, current medications, and problem list.    Review of Systems:   Patient denies headache, fevers, malaise, unintentional weight loss, skin rash, eye pain, sinus congestion and sinus pain, sore throat, dysphagia,  hemoptysis , cough, dyspnea, wheezing, chest pain, palpitations, orthopnea, edema, abdominal pain, nausea, melena, diarrhea, constipation, flank pain,  dysuria, hematuria, urinary  Frequency, nocturia, numbness, tingling, seizures,  Focal weakness, Loss of consciousness,  Tremor, insomnia, depression, anxiety, and suicidal ideation.     History   Social History  . Marital Status: Married    Spouse Name: N/A    Number of Children: N/A  . Years of Education: N/A   Occupational History  . Not on file.   Social History Main Topics  . Smoking status: Former Smoker    Quit date: 05/15/1987  . Smokeless tobacco: Never Used     Comment: quit 27 years ago   . Alcohol Use: Yes     Comment: rare  . Drug Use: No  . Sexual Activity: Yes    Birth Control/ Protection: Post-menopausal   Other Topics Concern  . 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 Lives in [place].     Objective:  Filed Vitals:   10/17/13 1329  BP: 128/78  Pulse: 77  Temp: 97.4 F (36.3 C)  Resp: 16     General appearance: alert, cooperative and appears stated age Ears: normal TM's and external ear canals both ears Throat: lips, mucosa, and tongue normal; teeth and gums normal Neck: no adenopathy, no carotid bruit, supple, symmetrical, trachea midline and thyroid not enlarged, symmetric, no tenderness/mass/nodules Back: symmetric, no curvature. ROM normal. No CVA tenderness. Lungs: clear to auscultation bilaterally Heart: regular rate and rhythm, S1,  S2 normal, no murmur, click, rub or gallop Abdomen: soft, non-tender; bowel sounds normal; no masses,  no organomegaly Pulses: 2+ and symmetric Skin: Skin color, texture, turgor normal. No rashes or lesions Lymph nodes: Cervical, supraclavicular, and axillary nodes normal.  Assessment and Plan:  UTI (urinary tract infection) Empiric septra was prescribed, pending finalization of urine culture.  There is no evidence of UTI by culture results.  If she is still having symptoms she will make appt for pelvic exam    Updated Medication List Outpatient Encounter  Prescriptions as of 10/17/2013  Medication Sig  . Calcium-Vitamin D 600-125 MG-UNIT TABS Take by mouth 2 (two) times daily.    . Multiple Vitamins-Iron (MULTIVITAMIN/IRON) TABS Take by mouth daily.    . NON FORMULARY Estriol 1 mg/ Testosterone .5 mg Vag tab: twice weekly   . NON FORMULARY PROGESTERONE CREAM 5% WITH DHEA 10MG /ML: 1/2 mL 25 days/month   . Omega-3 Fatty Acids (FISH OIL) 1200 MG CAPS Take 2 capsules by mouth daily.    . methocarbamol (ROBAXIN) 500 MG tablet Take 1 tablet (500 mg total) by mouth 4 (four) times daily.  Marland Kitchen sulfamethoxazole-trimethoprim (SEPTRA DS) 800-160 MG per tablet Take 1 tablet by mouth 2 (two) times daily.     Orders Placed This Encounter  Procedures  . Urine culture  . HM MAMMOGRAPHY  . HM MAMMOGRAPHY  . HM PAP SMEAR  . POCT urinalysis dipstick    No Follow-up on file.

## 2013-10-18 LAB — URINE CULTURE
Colony Count: NO GROWTH
Organism ID, Bacteria: NO GROWTH

## 2013-10-19 ENCOUNTER — Encounter: Payer: Self-pay | Admitting: Internal Medicine

## 2013-10-21 ENCOUNTER — Encounter: Payer: Self-pay | Admitting: Internal Medicine

## 2013-10-21 NOTE — Assessment & Plan Note (Signed)
Empiric septra was prescribed, pending finalization of urine culture.  There is no evidence of UTI by culture results.  If she is still having symptoms she will make appt for pelvic exam

## 2013-10-30 ENCOUNTER — Telehealth: Payer: Self-pay | Admitting: Internal Medicine

## 2013-10-30 NOTE — Telephone Encounter (Signed)
Can we re-code this or do I need to send to [facility]

## 2013-10-30 NOTE — Telephone Encounter (Signed)
Pt called, states she had her repeat bone density done in December.  States she is receiving a bill that it is not being covered.  States she has spoken with her insurance who said it should have been covered.  Pt states it was coded as diagnostic, but should have been a follow up to make sure levels were not going lower than the previous test had shown.  Pt would like this to be looked in to as she feels it should have been paid for.

## 2013-10-31 NOTE — Telephone Encounter (Signed)
Please advise 

## 2013-10-31 NOTE — Telephone Encounter (Signed)
She has osteopenia and it was coded as such.  Send to [facility].  Whatever she wants to do.

## 2013-11-15 NOTE — Telephone Encounter (Signed)
Michele Hess please see below. Will you please look at this bill for the patient?

## 2014-01-31 ENCOUNTER — Telehealth: Payer: Self-pay | Admitting: *Deleted

## 2014-01-31 NOTE — Telephone Encounter (Signed)
Received fax from [facility], requesting refill on E3 1 mg + Test 0.5 mg vaginal tablet. Insert one tablet vaginally twice weekly. Ok refill?

## 2014-01-31 NOTE — Telephone Encounter (Signed)
Called refills to pharmacy #[id number] with 3 refills

## 2014-01-31 NOTE — Telephone Encounter (Signed)
yes please refill via phone since there is no EPIC  Equivalent.

## 2014-03-29 ENCOUNTER — Encounter: Payer: Self-pay | Admitting: Internal Medicine

## 2014-03-29 ENCOUNTER — Ambulatory Visit (INDEPENDENT_AMBULATORY_CARE_PROVIDER_SITE_OTHER): Payer: BC Managed Care – PPO | Admitting: Internal Medicine

## 2014-03-29 VITALS — BP 132/74 | HR 96 | Temp 97.6°F | Resp 16 | Ht 63.0 in | Wt 129.8 lb

## 2014-03-29 DIAGNOSIS — L678 Other hair color and hair shaft abnormalities: Secondary | ICD-10-CM

## 2014-03-29 DIAGNOSIS — L738 Other specified follicular disorders: Secondary | ICD-10-CM

## 2014-03-29 MED ORDER — KETOCONAZOLE 2 % EX CREA: 1.0000 "application " | TOPICAL_CREAM | Freq: Every day | CUTANEOUS | Status: DC

## 2014-03-29 MED ORDER — HYDROXYZINE HCL 25 MG PO TABS: 25.0000 mg | ORAL_TABLET | Freq: Three times a day (TID) | ORAL | Status: DC | PRN

## 2014-03-29 NOTE — Patient Instructions (Addendum)
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

## 2014-03-29 NOTE — Progress Notes (Signed)
Pre-visit discussion using our clinic review tool. No additional management support is needed unless otherwise documented below in the visit note.  

## 2014-03-29 NOTE — Assessment & Plan Note (Addendum)
Recurring annually with itching.  Prior treatment with selsun blue shampoo.  Offered rx for nizoral cream ,,  Hydroxyzine oral tid for itching

## 2014-03-29 NOTE — Progress Notes (Signed)
Patient ID: Michele Hess, female   DOB: 15-Apr-1956, 58 y.o.   MRN: [id number]   Patient Active Problem List   Diagnosis Date Noted  . 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 UTI (urinary tract infection) 10/17/2013  . Left hip pain 06/07/2013  . Osteopenia 06/07/2013  . 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 Routine general medical examination at a health care facility 03/11/2012  . Menopausal and postmenopausal disorder   . HYPERLIPIDEMIA, MIXED 04/02/2009  . TACHYCARDIA 04/02/2009    Subjective:  CC:   Chief Complaint  Patient presents with  . Rash    Since monday on torso and chest. itches.    HPI:   Michele Hess is a 58 y.o. female who presents for evaluation of   Red itchy skin rash started on Sunday night under her left breast and sternal area, but has now spread to the area under her right breast.    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    Past Medical History  Diagnosis Date  . History of ETT     myoview 7/09: 9 min 31 sec, EF 71%, nov evidence for ischemia. ETT 99/10)" 1030", excellent exercise tolerance, no abnormal rhythms, no evidence for ischemia  . HLD (hyperlipidemia)   . Menopausal and postmenopausal disorder     Past Surgical History  Procedure Laterality Date  . Partial hysterectomy    . Echo (other)  9/10    EF 60%, lv normal, RV normal, mild MR, trivial pericardial effusion   . Total abdominal hysterectomy  1993    [name]       The following portions of the patient's history were reviewed and updated as appropriate: Allergies, current medications, and problem list.    Review of Systems:   Patient denies headache, fevers, malaise, unintentional weight loss, skin rash, eye pain, sinus congestion and sinus pain,  sore throat, dysphagia,  hemoptysis , cough, dyspnea, wheezing, chest pain, palpitations, orthopnea, edema, abdominal pain, nausea, melena, diarrhea, constipation, flank pain, dysuria, hematuria, urinary  Frequency, nocturia, numbness, tingling, seizures,  Focal weakness, Loss of consciousness,  Tremor, insomnia, depression, anxiety, and suicidal ideation.     History   Social History  . Marital Status: Married    Spouse Name: N/A    Number of Children: N/A  . Years of Education: N/A   Occupational History  . Not on file.   Social History Main Topics  . Smoking status: Former Smoker    Quit date: 05/15/1987  . Smokeless tobacco: Never Used     Comment: quit 27 years ago   . Alcohol Use: Yes     Comment: rare  . Drug Use: No  . Sexual Activity: Yes    Birth Control/ Protection: Post-menopausal   Other Topics Concern  . 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     Objective:  Filed Vitals:   03/29/14 1104  BP: 132/74  Pulse: 96  Temp: 97.6 F (36.4 C)  Resp: 16     General appearance: alert, cooperative and appears stated age Neck: no adenopathy, no carotid bruit, supple, symmetrical, trachea midline and thyroid not enlarged, symmetric, no tenderness/mass/nodules Back: symmetric, no curvature. ROM normal. No CVA tenderness. Lungs: clear  to auscultation bilaterally Heart: regular rate and rhythm, S1, S2 normal, no murmur, click, rub or gallop Abdomen: soft, non-tender; bowel sounds normal; no masses,  no organomegaly Pulses: 2+ and symmetric Skin: dermatiform pattern of red papular skin lesions under both breasts and on sternum.  Lymph nodes: Cervical, supraclavicular, and axillary nodes normal.  Assessment and Plan:  Pityrosporum folliculitis Recurring annually with itching.  Prior treatment with selsun blue shampoo.  Offered rx for nizoral cream ,,  Hydroxyzine oral tid for itching    Updated  Medication List Outpatient Encounter Prescriptions as of 03/29/2014  Medication Sig  . Calcium-Vitamin D 600-125 MG-UNIT TABS Take by mouth 2 (two) times daily.    . Multiple Vitamins-Iron (MULTIVITAMIN/IRON) TABS Take by mouth daily.    . NON FORMULARY Estriol 1 mg/ Testosterone .5 mg Vag tab: twice weekly   . NON FORMULARY PROGESTERONE CREAM 5% WITH DHEA 10MG /ML: 1/2 mL 25 days/month   . Omega-3 Fatty Acids (FISH OIL) 1200 MG CAPS Take 2 capsules by mouth daily.    . hydrOXYzine (ATARAX/VISTARIL) 25 MG tablet Take 1 tablet (25 mg total) by mouth 3 (three) times daily as needed.  Marland Kitchen ketoconazole (NIZORAL) 2 % cream Apply 1 application topically daily.  . methocarbamol (ROBAXIN) 500 MG tablet Take 1 tablet (500 mg total) by mouth 4 (four) times daily.  Marland Kitchen sulfamethoxazole-trimethoprim (SEPTRA DS) 800-160 MG per tablet Take 1 tablet by mouth 2 (two) times daily.     No orders of the defined types were placed in this encounter.    No Follow-up on file.

## 2014-04-01 ENCOUNTER — Encounter: Payer: Self-pay | Admitting: Internal Medicine

## 2014-05-21 ENCOUNTER — Encounter: Payer: Self-pay | Admitting: Internal Medicine

## 2014-05-21 ENCOUNTER — Telehealth: Payer: Self-pay | Admitting: Internal Medicine

## 2014-05-21 DIAGNOSIS — E782 Mixed hyperlipidemia: Secondary | ICD-10-CM

## 2014-05-21 DIAGNOSIS — R5383 Other fatigue: Secondary | ICD-10-CM

## 2014-05-21 DIAGNOSIS — M8580 Other specified disorders of bone density and structure, unspecified site: Secondary | ICD-10-CM

## 2014-05-21 NOTE — Telephone Encounter (Signed)
The patient is scheduled for her physical on 12.11.15 . She is wanting to come in for labs prior . Is this ok, if so please enter labs.

## 2014-05-21 NOTE — Telephone Encounter (Signed)
Fasting labs added

## 2014-05-21 NOTE — Telephone Encounter (Signed)
Please advise 

## 2014-05-22 NOTE — Telephone Encounter (Signed)
Patient aware of lab date?

## 2014-05-24 LAB — HM MAMMOGRAPHY: HM MAMMO: NORMAL

## 2014-05-30 ENCOUNTER — Encounter: Payer: Self-pay | Admitting: Internal Medicine

## 2014-06-05 ENCOUNTER — Encounter: Payer: Self-pay | Admitting: Internal Medicine

## 2014-07-11 ENCOUNTER — Other Ambulatory Visit (INDEPENDENT_AMBULATORY_CARE_PROVIDER_SITE_OTHER): Payer: BC Managed Care – PPO

## 2014-07-11 DIAGNOSIS — E782 Mixed hyperlipidemia: Secondary | ICD-10-CM

## 2014-07-11 DIAGNOSIS — M8580 Other specified disorders of bone density and structure, unspecified site: Secondary | ICD-10-CM

## 2014-07-11 DIAGNOSIS — R5383 Other fatigue: Secondary | ICD-10-CM

## 2014-07-11 LAB — COMPREHENSIVE METABOLIC PANEL
ALK PHOS: 48 U/L (ref 39–117)
ALT: 10 U/L (ref 0–35)
AST: 22 U/L (ref 0–37)
Albumin: 4.4 g/dL (ref 3.5–5.2)
BUN: 14 mg/dL (ref 6–23)
CO2: 28 mEq/L (ref 19–32)
Calcium: 9.2 mg/dL (ref 8.4–10.5)
Chloride: 103 mEq/L (ref 96–112)
Creatinine, Ser: 0.7 mg/dL (ref 0.4–1.2)
GFR: 94.61 mL/min (ref >60.00)
Glucose, Bld: 104 mg/dL — ABNORMAL HIGH (ref 70–99)
Potassium: 4.8 mEq/L (ref 3.5–5.1)
SODIUM: 139 meq/L (ref 135–145)
TOTAL PROTEIN: 7.1 g/dL (ref 6.0–8.3)
Total Bilirubin: 0.4 mg/dL (ref 0.2–1.2)

## 2014-07-11 LAB — CBC WITH DIFFERENTIAL/PLATELET
BASOS ABS: 0 10*3/uL (ref 0.0–0.1)
Basophils Relative: 0.5 % (ref 0.0–3.0)
Eosinophils Absolute: 0.1 10*3/uL (ref 0.0–0.7)
Eosinophils Relative: 1.3 % (ref 0.0–5.0)
HEMATOCRIT: 41.4 % (ref 36.0–46.0)
Hemoglobin: 13.7 g/dL (ref 12.0–15.0)
Lymphocytes Relative: 44.9 % (ref 12.0–46.0)
Lymphs Abs: 2.2 10*3/uL (ref 0.7–4.0)
MCHC: 33.2 g/dL (ref 30.0–36.0)
MCV: 90 fl (ref 78.0–100.0)
MONO ABS: 0.4 10*3/uL (ref 0.1–1.0)
Monocytes Relative: 8.5 % (ref 3.0–12.0)
NEUTROS PCT: 44.8 % (ref 43.0–77.0)
Neutro Abs: 2.2 10*3/uL (ref 1.4–7.7)
PLATELETS: 295 10*3/uL (ref 150.0–400.0)
RBC: 4.6 Mil/uL (ref 3.87–5.11)
RDW: 13 % (ref 11.5–15.5)
WBC: 4.8 10*3/uL (ref 4.0–10.5)

## 2014-07-11 LAB — LIPID PANEL
CHOL/HDL RATIO: 3
Cholesterol: 234 mg/dL — ABNORMAL HIGH (ref 0–200)
HDL: 74.6 mg/dL (ref >39.00)
LDL CALC: 149 mg/dL — AB (ref 0–99)
NONHDL: 159.4
Triglycerides: 53 mg/dL (ref 0.0–149.0)
VLDL: 10.6 mg/dL (ref 0.0–40.0)

## 2014-07-11 LAB — VITAMIN D 25 HYDROXY (VIT D DEFICIENCY, FRACTURES): VITD: 41.89 ng/mL (ref 30.00–100.00)

## 2014-07-11 LAB — TSH: TSH: 2.95 u[IU]/mL (ref 0.35–4.50)

## 2014-07-13 ENCOUNTER — Encounter: Payer: Self-pay | Admitting: Internal Medicine

## 2014-07-13 ENCOUNTER — Ambulatory Visit (INDEPENDENT_AMBULATORY_CARE_PROVIDER_SITE_OTHER): Payer: BC Managed Care – PPO | Admitting: Internal Medicine

## 2014-07-13 VITALS — BP 120/88 | HR 74 | Temp 97.4°F | Resp 14 | Ht 63.0 in | Wt 132.5 lb

## 2014-07-13 DIAGNOSIS — Z0000 Encounter for general adult medical examination without abnormal findings: Secondary | ICD-10-CM

## 2014-07-13 NOTE — Progress Notes (Signed)
Pre-visit discussion using our clinic review tool. No additional management support is needed unless otherwise documented below in the visit note.  

## 2014-07-13 NOTE — Patient Instructions (Addendum)
You had your annual  wellness exam today.  We will repeat your PAP smear in 2016, sooner if needed  Health Maintenance Adopting a healthy lifestyle and getting preventive care can go a long way to promote health and wellness. Talk with your health care provider about what schedule of regular examinations is right for you. This is a good chance for you to check in with your provider about disease prevention and staying healthy. In between checkups, there are plenty of things you can do on your own. Experts have done a lot of research about which lifestyle changes and preventive measures are most likely to keep you healthy. Ask your health care provider for more information. WEIGHT AND DIET  Eat a healthy diet  Be sure to include plenty of vegetables, fruits, low-fat dairy products, and lean protein.  Do not eat a lot of foods high in solid fats, added sugars, or salt.  Get regular exercise. This is one of the most important things you can do for your health.  Most adults should exercise for at least 150 minutes each week. The exercise should increase your heart rate and make you sweat (moderate-intensity exercise).  Most adults should also do strengthening exercises at least twice a week. This is in addition to the moderate-intensity exercise.  Maintain a healthy weight  Body mass index (BMI) is a measurement that can be used to identify possible weight problems. It estimates body fat based on height and weight. Your health care provider can help determine your BMI and help you achieve or maintain a healthy weight.  For females 49 years of age and older:   A BMI below 18.5 is considered underweight.  A BMI of 18.5 to 24.9 is normal.  A BMI of 25 to 29.9 is considered overweight.  A BMI of 30 and above is considered obese.  Watch levels of cholesterol and blood lipids  You should start having your blood tested for lipids and cholesterol at 57 years of age, then have this test every 5  years.  You may need to have your cholesterol levels checked more often if:  Your lipid or cholesterol levels are high.  You are older than 57 years of age.  You are at high risk for heart disease.  CANCER SCREENING   Lung Cancer  Lung cancer screening is recommended for adults 62-62 years old who are at high risk for lung cancer because of a history of smoking.  A yearly low-dose CT scan of the lungs is recommended for people who:  Currently smoke.  Have quit within the past 15 years.  Have at least a 30-pack-year history of smoking. A pack year is smoking an average of one pack of cigarettes a day for 1 year.  Yearly screening should continue until it has been 15 years since you quit.  Yearly screening should stop if you develop a health problem that would prevent you from having lung cancer treatment.  Breast Cancer  Practice breast self-awareness. This means understanding how your breasts normally appear and feel.  It also means doing regular breast self-exams. Let your health care provider know about any changes, no matter how small.  If you are in your 20s or 30s, you should have a clinical breast exam (CBE) by a health care provider every 1-3 years as part of a regular health exam.  If you are 46 or older, have a CBE every year. Also consider having a breast X-ray (mammogram) every year.  If  you have a family history of breast cancer, talk to your health care provider about genetic screening.  If you are at high risk for breast cancer, talk to your health care provider about having an MRI and a mammogram every year.  Breast cancer gene (BRCA) assessment is recommended for women who have family members with BRCA-related cancers. BRCA-related cancers include:  Breast.  Ovarian.  Tubal.  Peritoneal cancers.  Results of the assessment will determine the need for genetic counseling and BRCA1 and BRCA2 testing. Cervical Cancer Routine pelvic examinations to  screen for cervical cancer are no longer recommended for nonpregnant women who are considered low risk for cancer of the pelvic organs (ovaries, uterus, and vagina) and who do not have symptoms. A pelvic examination may be necessary if you have symptoms including those associated with pelvic infections. Ask your health care provider if a screening pelvic exam is right for you.   The Pap test is the screening test for cervical cancer for women who are considered at risk.  If you had a hysterectomy for a problem that was not cancer or a condition that could lead to cancer, then you no longer need Pap tests.  If you are older than 65 years, and you have had normal Pap tests for the past 10 years, you no longer need to have Pap tests.  If you have had past treatment for cervical cancer or a condition that could lead to cancer, you need Pap tests and screening for cancer for at least 20 years after your treatment.  If you no longer get a Pap test, assess your risk factors if they change (such as having a new sexual partner). This can affect whether you should start being screened again.  Some women have medical problems that increase their chance of getting cervical cancer. If this is the case for you, your health care provider may recommend more frequent screening and Pap tests.  The human papillomavirus (HPV) test is another test that may be used for cervical cancer screening. The HPV test looks for the virus that can cause cell changes in the cervix. The cells collected during the Pap test can be tested for HPV.  The HPV test can be used to screen women 10 years of age and older. Getting tested for HPV can extend the interval between normal Pap tests from three to five years.  An HPV test also should be used to screen women of any age who have unclear Pap test results.  After 57 years of age, women should have HPV testing as often as Pap tests.  Colorectal Cancer  This type of cancer can be  detected and often prevented.  Routine colorectal cancer screening usually begins at 57 years of age and continues through 57 years of age.  Your health care provider may recommend screening at an earlier age if you have risk factors for colon cancer.  Your health care provider may also recommend using home test kits to check for hidden blood in the stool.  A small camera at the end of a tube can be used to examine your colon directly (sigmoidoscopy or colonoscopy). This is done to check for the earliest forms of colorectal cancer.  Routine screening usually begins at age 83.  Direct examination of the colon should be repeated every 5-10 years through 57 years of age. However, you may need to be screened more often if early forms of precancerous polyps or small growths are found. Skin Cancer  Check your skin from head to toe regularly.  Tell your health care provider about any new moles or changes in moles, especially if there is a change in a mole's shape or color.  Also tell your health care provider if you have a mole that is larger than the size of a pencil eraser.  Always use sunscreen. Apply sunscreen liberally and repeatedly throughout the day.  Protect yourself by wearing long sleeves, pants, a wide-brimmed hat, and sunglasses whenever you are outside. HEART DISEASE, DIABETES, AND HIGH BLOOD PRESSURE   Have your blood pressure checked at least every 1-2 years. High blood pressure causes heart disease and increases the risk of stroke.  If you are between 51 years and 36 years old, ask your health care provider if you should take aspirin to prevent strokes.  Have regular diabetes screenings. This involves taking a blood sample to check your fasting blood sugar level.  If you are at a normal weight and have a low risk for diabetes, have this test once every three years after 57 years of age.  If you are overweight and have a high risk for diabetes, consider being tested at a  younger age or more often. PREVENTING INFECTION  Hepatitis B  If you have a higher risk for hepatitis B, you should be screened for this virus. You are considered at high risk for hepatitis B if:  You were born in a country where hepatitis B is common. Ask your health care provider which countries are considered high risk.  Your parents were born in a high-risk country, and you have not been immunized against hepatitis B (hepatitis B vaccine).  You have HIV or AIDS.  You use needles to inject street drugs.  You live with someone who has hepatitis B.  You have had sex with someone who has hepatitis B.  You get hemodialysis treatment.  You take certain medicines for conditions, including cancer, organ transplantation, and autoimmune conditions. Hepatitis C  Blood testing is recommended for:  Everyone born from [birth year] through 1964.  Anyone with known risk factors for hepatitis C. Sexually transmitted infections (STIs)  You should be screened for sexually transmitted infections (STIs) including gonorrhea and chlamydia if:  You are sexually active and are younger than 57 years of age.  You are older than 57 years of age and your health care provider tells you that you are at risk for this type of infection.  Your sexual activity has changed since you were last screened and you are at an increased risk for chlamydia or gonorrhea. Ask your health care provider if you are at risk.  If you do not have HIV, but are at risk, it may be recommended that you take a prescription medicine daily to prevent HIV infection. This is called pre-exposure prophylaxis (PrEP). You are considered at risk if:  You are sexually active and do not regularly use condoms or know the HIV status of your partner(s).  You take drugs by injection.  You are sexually active with a partner who has HIV. Talk with your health care provider about whether you are at high risk of being infected with HIV. If you choose  to begin PrEP, you should first be tested for HIV. You should then be tested every 3 months for as long as you are taking PrEP.  PREGNANCY   If you are premenopausal and you may become pregnant, ask your health care provider about preconception counseling.  If you may become pregnant,  take 400 to 800 micrograms (mcg) of folic acid every day.  If you want to prevent pregnancy, talk to your health care provider about birth control (contraception). OSTEOPOROSIS AND MENOPAUSE   Osteoporosis is a disease in which the bones lose minerals and strength with aging. This can result in serious bone fractures. Your risk for osteoporosis can be identified using a bone density scan.  If you are 66 years of age or older, or if you are at risk for osteoporosis and fractures, ask your health care provider if you should be screened.  Ask your health care provider whether you should take a calcium or vitamin D supplement to lower your risk for osteoporosis.  Menopause may have certain physical symptoms and risks.  Hormone replacement therapy may reduce some of these symptoms and risks. Talk to your health care provider about whether hormone replacement therapy is right for you.  HOME CARE INSTRUCTIONS   Schedule regular health, dental, and eye exams.  Stay current with your immunizations.   Do not use any tobacco products including cigarettes, chewing tobacco, or electronic cigarettes.  If you are pregnant, do not drink alcohol.  If you are breastfeeding, limit how much and how often you drink alcohol.  Limit alcohol intake to no more than 1 drink per day for nonpregnant women. One drink equals 12 ounces of beer, 5 ounces of wine, or 1 ounces of hard liquor.  Do not use street drugs.  Do not share needles.  Ask your health care provider for help if you need support or information about quitting drugs.  Tell your health care provider if you often feel depressed.  Tell your health care  provider if you have ever been abused or do not feel safe at home. Document Released: 02/02/2011 Document Revised: 12/04/2013 Document Reviewed: 06/21/2013 [facility] Patient Information 2015 [place], [place]. This information is not intended to replace advice given to you by your health care provider. Make sure you discuss any questions you have with your health care provider.

## 2014-07-13 NOTE — Progress Notes (Signed)
Patient ID: Michele Hess, female   DOB: 1955-08-07, 58 y.o.   MRN: [id number]  Subjective:     Michele Hess is a 58 y.o. female and is here for a comprehensive physical exam. The patient reports no problems.  History   Social History  . Marital Status: Married    Spouse Name: N/A    Number of Children: N/A  . Years of Education: N/A   Occupational History  . Not on file.   Social History Main Topics  . Smoking status: Former Smoker    Quit date: 05/15/1987  . Smokeless tobacco: Never Used     Comment: quit 27 years ago   . Alcohol Use: Yes     Comment: rare  . Drug Use: No  . Sexual Activity: Yes    Birth Control/ Protection: Post-menopausal   Other Topics Concern  . 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 Lives in [place].    Health Maintenance  Topic Date Due  . INFLUENZA VACCINE  03/04/2015  . PAP SMEAR  03/24/2015  . MAMMOGRAM  05/24/2016  . TETANUS/TDAP  09/04/2018  . COLONOSCOPY  10/07/2018    The following portions of the patient's history were reviewed and updated as appropriate: allergies, current medications, past family history, past medical history, past social history, past surgical history and problem list.  Review of Systems A comprehensive review of systems was negative.   Objective:   BP 120/88 mmHg  Pulse 74  Temp(Src) 97.4 F (36.3 C) (Oral)  Resp 14  Ht 5\' 3"  (1.6 m)  Wt 132 lb 8 oz (60.102 kg)  BMI 23.48 kg/m2  SpO2 99%  General appearance: alert, cooperative and appears stated age Head: Normocephalic, without obvious abnormality, atraumatic Eyes: conjunctivae/corneas clear. PERRL, EOM's intact. Fundi benign. Ears: normal TM's and external ear canals both ears Nose: Nares normal. Septum midline. Mucosa normal. No drainage or sinus tenderness. Throat: lips, mucosa, and tongue normal; teeth and gums normal Neck: no adenopathy, no carotid bruit, no JVD, supple, symmetrical, trachea  midline and thyroid not enlarged, symmetric, no tenderness/mass/nodules Lungs: clear to auscultation bilaterally Breasts: normal appearance, no masses or tenderness Heart: regular rate and rhythm, S1, S2 normal, no murmur, click, rub or gallop Abdomen: soft, non-tender; bowel sounds normal; no masses,  no organomegaly Extremities: extremities normal, atraumatic, no cyanosis or edema Pulses: 2+ and symmetric Skin: Skin color, texture, turgor normal. No rashes or lesions Neurologic: Alert and oriented X 3, normal strength and tone. Normal symmetric reflexes. Normal coordination and gait.    Assessment and Plan:   Problem List Items Addressed This Visit      Other   Routine general medical examination at a health care facility - Primary    Annual wellness  exam was done as well as a comprehensive physical exam and management of acute and chronic conditions .  During the course of the visit the patient was educated and counseled about appropriate screening and preventive services including :  diabetes screening, lipid analysis with projected  10 year  risk for CAD , nutrition counseling, colorectal cancer screening, and recommended immunizations.  Printed recommendations for health maintenance screenings was given.

## 2014-07-15 NOTE — Assessment & Plan Note (Signed)

## 2014-09-03 ENCOUNTER — Encounter: Payer: Self-pay | Admitting: Internal Medicine

## 2014-09-07 ENCOUNTER — Telehealth: Payer: Self-pay | Admitting: Internal Medicine

## 2014-09-07 NOTE — Telephone Encounter (Signed)
FYI

## 2014-09-07 NOTE — Telephone Encounter (Signed)
Patient Name: Michele Hess  DOB: 07-Sep-1955    Initial Comment Caller states she woke up with headache and unsteady on feet   Nurse Assessment  Nurse: Mallie Mussel, RN, Alveta Heimlich Date/Time Eilene Ghazi Time): 09/07/2014 2:17:13 PM  Confirm and document reason for call. If symptomatic, describe symptoms. ---Caller states that she has a headache beginning this morning. She has had some dizziness with this also. She has take IBU. She rates her headache pain as 3 on 0-10 scale. She has had some drainage. There has been some puffiness around her cheek bones and eyes. Denies fever. Denies room spinning. She can walk without having to hold onto anything.  Has the patient traveled out of the country within the last 30 days? ---No  Does the patient require triage? ---Yes  Related visit to physician within the last 2 weeks? ---No  Does the PT have any chronic conditions? (i.e. diabetes, asthma, etc.) ---No     Guidelines    Guideline Title Affirmed Question Affirmed Notes  Sinus Pain or Congestion [1] Redness or swelling on the cheek, forehead or around the eye AND [2] no fever    Final Disposition User   See Physician within 4 Hours (or PCP triage) Mallie Mussel, RN, Alveta Heimlich    Comments  Attempted to schedule an appointment at both the [facility] and [facility]. No appointments available for this afternoon. She asked about Saturday hours. I discussed the [place] location with her. She states that she will just look for a local walk-in clinic.

## 2014-09-23 ENCOUNTER — Encounter: Payer: Self-pay | Admitting: Internal Medicine

## 2014-09-25 NOTE — Telephone Encounter (Signed)
Ok to refill the Estriol for a year.

## 2014-10-17 ENCOUNTER — Ambulatory Visit (INDEPENDENT_AMBULATORY_CARE_PROVIDER_SITE_OTHER): Payer: BC Managed Care – PPO | Admitting: *Deleted

## 2014-10-17 DIAGNOSIS — Z23 Encounter for immunization: Secondary | ICD-10-CM

## 2014-12-03 ENCOUNTER — Encounter: Payer: Self-pay | Admitting: Internal Medicine

## 2014-12-07 NOTE — Addendum Note (Signed)
Addended by: Leeanne Rio on: 12/07/2014 12:01 PM   Modules accepted: Orders

## 2014-12-14 ENCOUNTER — Other Ambulatory Visit: Payer: Self-pay | Admitting: *Deleted

## 2014-12-14 NOTE — Telephone Encounter (Signed)
Fax from [facility], needing refill on PG 5%/DHEA 10 mg/mL HRT. Apply 1/2 mL daily to twice daily 25 days each month. #30 days with 11 refills. Refill approved and faxed back to [facility]

## 2015-03-18 ENCOUNTER — Encounter: Payer: Self-pay | Admitting: Internal Medicine

## 2015-03-18 ENCOUNTER — Other Ambulatory Visit: Payer: Self-pay | Admitting: Internal Medicine

## 2015-03-18 MED ORDER — METRONIDAZOLE 500 MG PO TABS: 500.0000 mg | ORAL_TABLET | Freq: Three times a day (TID) | ORAL | Status: DC

## 2015-03-18 MED ORDER — LEVOFLOXACIN 500 MG PO TABS: 500.0000 mg | ORAL_TABLET | Freq: Every day | ORAL | Status: DC

## 2015-03-18 NOTE — Telephone Encounter (Signed)
Faxed script to pharmacy

## 2015-04-30 ENCOUNTER — Ambulatory Visit (INDEPENDENT_AMBULATORY_CARE_PROVIDER_SITE_OTHER): Payer: BC Managed Care – PPO

## 2015-04-30 DIAGNOSIS — Z23 Encounter for immunization: Secondary | ICD-10-CM

## 2015-05-29 ENCOUNTER — Telehealth: Payer: Self-pay | Admitting: Internal Medicine

## 2015-05-29 DIAGNOSIS — R5383 Other fatigue: Secondary | ICD-10-CM

## 2015-05-29 DIAGNOSIS — E782 Mixed hyperlipidemia: Secondary | ICD-10-CM

## 2015-05-29 DIAGNOSIS — M8580 Other specified disorders of bone density and structure, unspecified site: Secondary | ICD-10-CM

## 2015-05-29 DIAGNOSIS — Z113 Encounter for screening for infections with a predominantly sexual mode of transmission: Secondary | ICD-10-CM

## 2015-05-29 NOTE — Telephone Encounter (Signed)
Pt sent a My chart request and wanted to get lab work done before her appt. I scheduled her for 06/06/2015 @10 :45am. I'm waiting for pt to ok the appt for 06/06/2015. Order is needed please and thank you!

## 2015-05-29 NOTE — Telephone Encounter (Signed)
Last labs were 07/11/14? Please advise?

## 2015-05-31 ENCOUNTER — Encounter: Payer: BC Managed Care – PPO | Admitting: Internal Medicine

## 2015-06-03 ENCOUNTER — Other Ambulatory Visit: Payer: Self-pay

## 2015-06-03 ENCOUNTER — Encounter: Payer: Self-pay | Admitting: Internal Medicine

## 2015-06-05 ENCOUNTER — Other Ambulatory Visit (INDEPENDENT_AMBULATORY_CARE_PROVIDER_SITE_OTHER): Payer: BC Managed Care – PPO

## 2015-06-05 DIAGNOSIS — Z113 Encounter for screening for infections with a predominantly sexual mode of transmission: Secondary | ICD-10-CM

## 2015-06-05 DIAGNOSIS — R5383 Other fatigue: Secondary | ICD-10-CM | POA: Diagnosis not present

## 2015-06-05 DIAGNOSIS — M8580 Other specified disorders of bone density and structure, unspecified site: Secondary | ICD-10-CM

## 2015-06-05 DIAGNOSIS — E782 Mixed hyperlipidemia: Secondary | ICD-10-CM

## 2015-06-05 LAB — COMPREHENSIVE METABOLIC PANEL
ALBUMIN: 4.5 g/dL (ref 3.5–5.2)
ALT: 7 U/L (ref 0–35)
AST: 20 U/L (ref 0–37)
Alkaline Phosphatase: 53 U/L (ref 39–117)
BILIRUBIN TOTAL: 0.6 mg/dL (ref 0.2–1.2)
BUN: 12 mg/dL (ref 6–23)
CALCIUM: 9.9 mg/dL (ref 8.4–10.5)
CO2: 31 meq/L (ref 19–32)
CREATININE: 0.57 mg/dL (ref 0.40–1.20)
Chloride: 101 mEq/L (ref 96–112)
GFR: 115.61 mL/min (ref >60.00)
Glucose, Bld: 101 mg/dL — ABNORMAL HIGH (ref 70–99)
Potassium: 5 mEq/L (ref 3.5–5.1)
Sodium: 137 mEq/L (ref 135–145)
Total Protein: 7.2 g/dL (ref 6.0–8.3)

## 2015-06-05 LAB — VITAMIN D 25 HYDROXY (VIT D DEFICIENCY, FRACTURES): VITD: 36.62 ng/mL (ref 30.00–100.00)

## 2015-06-05 LAB — LIPID PANEL
CHOLESTEROL: 254 mg/dL — AB (ref 0–200)
HDL: 84.9 mg/dL (ref >39.00)
LDL CALC: 141 mg/dL — AB (ref 0–99)
NonHDL: 169.18
TRIGLYCERIDES: 140 mg/dL (ref 0.0–149.0)
Total CHOL/HDL Ratio: 3
VLDL: 28 mg/dL (ref 0.0–40.0)

## 2015-06-05 LAB — TSH: TSH: 2.19 u[IU]/mL (ref 0.35–4.50)

## 2015-06-05 LAB — LDL CHOLESTEROL, DIRECT: Direct LDL: 137 mg/dL

## 2015-06-06 ENCOUNTER — Encounter: Payer: BC Managed Care – PPO | Admitting: Internal Medicine

## 2015-06-06 LAB — HIV ANTIBODY (ROUTINE TESTING W REFLEX): HIV 1&2 Ab, 4th Generation: NONREACTIVE

## 2015-06-06 LAB — HEPATITIS C ANTIBODY: HCV Ab: NEGATIVE

## 2015-06-08 ENCOUNTER — Encounter: Payer: Self-pay | Admitting: Internal Medicine

## 2015-06-12 ENCOUNTER — Other Ambulatory Visit (HOSPITAL_COMMUNITY)
Admission: RE | Admit: 2015-06-12 | Discharge: 2015-06-12 | Disposition: A | Discharge status: Home / Self Care | Payer: BC Managed Care – PPO | Source: Ambulatory Visit | Attending: Internal Medicine | Admitting: Internal Medicine

## 2015-06-12 ENCOUNTER — Encounter: Payer: Self-pay | Admitting: Internal Medicine

## 2015-06-12 ENCOUNTER — Ambulatory Visit (INDEPENDENT_AMBULATORY_CARE_PROVIDER_SITE_OTHER): Payer: BC Managed Care – PPO | Admitting: Internal Medicine

## 2015-06-12 VITALS — BP 130/70 | HR 73 | Temp 98.0°F | Wt 132.0 lb

## 2015-06-12 DIAGNOSIS — E782 Mixed hyperlipidemia: Secondary | ICD-10-CM

## 2015-06-12 DIAGNOSIS — Z1151 Encounter for screening for human papillomavirus (HPV): Secondary | ICD-10-CM | POA: Insufficient documentation

## 2015-06-12 DIAGNOSIS — Z23 Encounter for immunization: Secondary | ICD-10-CM

## 2015-06-12 DIAGNOSIS — Z0000 Encounter for general adult medical examination without abnormal findings: Secondary | ICD-10-CM | POA: Diagnosis not present

## 2015-06-12 DIAGNOSIS — Z1239 Encounter for other screening for malignant neoplasm of breast: Secondary | ICD-10-CM

## 2015-06-12 DIAGNOSIS — Z01419 Encounter for gynecological examination (general) (routine) without abnormal findings: Secondary | ICD-10-CM | POA: Diagnosis present

## 2015-06-12 DIAGNOSIS — Z124 Encounter for screening for malignant neoplasm of cervix: Secondary | ICD-10-CM

## 2015-06-12 NOTE — Progress Notes (Signed)
Patient ID: Michele Hess, female    DOB: 02/25/1956  Age: 59 y.o. MRN: [id number]  The patient is here for annual  wellness examination and management of other chronic and acute problems.   training for half marathon Some stiffness left knee, priro ACL repair   In 2013 Labs reviewed  Using vaginal tab and estrogen/progesterone  compound   The risk factors are reflected in the social history.  The roster of all physicians providing medical care to patient - is listed in the Snapshot section of the chart.  Home safety : The patient has smoke detectors in the home. They wear seatbelts.  There are no firearms at home. There is no violence in the home.   There is no risks for hepatitis, STDs or HIV. There is no   history of blood transfusion. They have no travel history to infectious disease endemic areas of the world.  The patient has seen their dentist in the last six month. They have seen their eye doctor in the last year. They admit to slight hearing difficulty with regard to whispered voices and some television programs.  They have deferred audiologic testing in the last year.  They do not  have excessive sun exposure. Discussed the need for sun protection: hats, long sleeves and use of sunscreen if there is significant sun exposure.   Diet: the importance of a healthy diet is discussed. They do have a healthy diet.  The benefits of regular aerobic exercise were discussed. She walks 4 times per week ,  20 minutes.   Depression screen: there are no signs or vegative symptoms of depression- irritability, change in appetite, anhedonia, sadness/tearfullness.  The following portions of the patient's history were reviewed and updated as appropriate: allergies, current medications, past family history, past medical history,  past surgical history, past social history  and problem list.  Visual acuity was not assessed per patient preference since she has regular follow up with her  ophthalmologist. Hearing and body mass index were assessed and reviewed.   During the course of the visit the patient was educated and counseled about appropriate screening and preventive services including : fall prevention , diabetes screening, nutrition counseling, colorectal cancer screening, and recommended immunizations.    CC: The primary encounter diagnosis was Encounter for immunization. Diagnoses of Breast cancer screening, Screening for cervical cancer, Encounter for preventive health examination, and HYPERLIPIDEMIA, MIXED were also pertinent to this visit.  History Briana has a past medical history of History of ETT; HLD (hyperlipidemia); and Menopausal and postmenopausal disorder.   She has past surgical history that includes Partial hysterectomy; echo (other) (9/10); and Total abdominal hysterectomy (1993).   Her family history includes Heart attack (age of onset: 68) in her mother; Heart attack (age of onset: 58) in her father.She reports that she quit smoking about 28 years ago. She has never used smokeless tobacco. She reports that she drinks alcohol. She reports that she does not use illicit drugs.  Outpatient Prescriptions Prior to Visit  Medication Sig Dispense Refill  . Calcium-Vitamin D 600-125 MG-UNIT TABS Take by mouth 2 (two) times daily.      . Multiple Vitamins-Iron (MULTIVITAMIN/IRON) TABS Take by mouth daily.      . NON FORMULARY Estriol 1 mg/ Testosterone .5 mg Vag tab: twice weekly     . NON FORMULARY PROGESTERONE CREAM 5% WITH DHEA 10MG /ML: 1/2 mL 25 days/month     . Omega-3 Fatty Acids (FISH OIL) 1200 MG CAPS Take 2 capsules  by mouth daily.      Marland Kitchen levofloxacin (LEVAQUIN) 500 MG tablet Take 1 tablet (500 mg total) by mouth daily. (Patient not taking: Reported on 06/12/2015) 7 tablet 0  . metroNIDAZOLE (FLAGYL) 500 MG tablet Take 1 tablet (500 mg total) by mouth 3 (three) times daily. (Patient not taking: Reported on 06/12/2015) 21 tablet 0   No  facility-administered medications prior to visit.    Review of Systems   Patient denies headache, fevers, malaise, unintentional weight loss, skin rash, eye pain, sinus congestion and sinus pain, sore throat, dysphagia,  hemoptysis , cough, dyspnea, wheezing, chest pain, palpitations, orthopnea, edema, abdominal pain, nausea, melena, diarrhea, constipation, flank pain, dysuria, hematuria, urinary  Frequency, nocturia, numbness, tingling, seizures,  Focal weakness, Loss of consciousness,  Tremor, insomnia, depression, anxiety, and suicidal ideation.      Objective:  BP 130/70 mmHg  Pulse 73  Temp(Src) 98 F (36.7 C)  Wt 132 lb (59.875 kg)  SpO2 97%  Physical Exam  General Appearance:    Alert, cooperative, no distress, appears stated age  Head:    Normocephalic, without obvious abnormality, atraumatic  Eyes:    PERRL, conjunctiva/corneas clear, EOM's intact, fundi    benign, both eyes  Ears:    Normal TM's and external ear canals, both ears  Nose:   Nares normal, septum midline, mucosa normal, no drainage    or sinus tenderness  Throat:   Lips, mucosa, and tongue normal; teeth and gums normal  Neck:   Supple, symmetrical, trachea midline, no adenopathy;    thyroid:  no enlargement/tenderness/nodules; no carotid   bruit or JVD  Back:     Symmetric, no curvature, ROM normal, no CVA tenderness  Lungs:     Clear to auscultation bilaterally, respirations unlabored  Chest Wall:    No tenderness or deformity   Heart:    Regular rate and rhythm, S1 and S2 normal, no murmur, rub   or gallop  Breast Exam:    No tenderness, masses, or nipple abnormality  Abdomen:     Soft, non-tender, bowel sounds active all four quadrants,    no masses, no organomegaly  Genitalia:    Pelvic: cervix normal in appearance, external genitalia normal, no adnexal masses or tenderness, no cervical motion tenderness, rectovaginal septum normal, uterus normal size, shape, and consistency and vagina normal without  discharge  Extremities:   Extremities normal, atraumatic, no cyanosis or edema  Pulses:   2+ and symmetric all extremities  Skin:   Skin color, texture, turgor normal, no rashes or lesions  Lymph nodes:   Cervical, supraclavicular, and axillary nodes normal  Neurologic:   CNII-XII intact, normal strength, sensation and reflexes    throughout      Assessment & Plan:   Problem List Items Addressed This Visit    HYPERLIPIDEMIA, MIXED    LDL has risen compared to last year  But 10 yr risk is still < 7% using the FRC.  will improve with marathon training.   Lab Results  Component Value Date   CHOL 254* 06/05/2015   HDL 84.90 06/05/2015   LDLCALC 141* 06/05/2015   LDLDIRECT 137.0 06/05/2015   TRIG 140.0 06/05/2015   CHOLHDL 3 06/05/2015         Encounter for preventive health examination    Annual wellness  exam was done as well as a comprehensive physical exam  .  During the course of the visit the patient was educated and counseled about appropriate screening and preventive  services and screenings were brought up to date for cervical and breast cancer .  She will return for fasting labs to provide samples for diabetes screening and lipid analysis with projected  10 year  risk for CAD. nutrition counseling, skin cancer screening has been recommended, along with review of the age appropriate recommended immunizations.  Printed recommendations for health maintenance screenings was given.         Other Visit Diagnoses    Encounter for immunization    -  Primary    Breast cancer screening        Relevant Orders    MM DIGITAL SCREENING BILATERAL    Screening for cervical cancer        Relevant Orders    Cytology - PAP (Completed)       I have discontinued Ms. Sandridge's levofloxacin and metroNIDAZOLE. I am also having her maintain her Calcium-Vitamin D, NON FORMULARY, Fish Oil, Multivitamin/Iron, and NON FORMULARY.  No orders of the defined types were placed in this encounter.     Medications Discontinued During This Encounter  Medication Reason  . levofloxacin (LEVAQUIN) 500 MG tablet   . metroNIDAZOLE (FLAGYL) 500 MG tablet     Follow-up: No Follow-up on file.   Crecencio Mc, MD

## 2015-06-12 NOTE — Patient Instructions (Addendum)
Your BP was a little high on the diastolic side today (761/95,  Normal is 130/80 or less).   Your homework is to check your BP 5 times over the next 4 weeks and send me the readings    You should take 1000 IUs of Vitamin D3 during the winter months  Orgain  Almond milk has 10 g protein per 8 ounce  Compared to the other almond millks ([employer] and the [employer])   Mammogram as been ordered   Menopause is a normal process in which your reproductive ability comes to an end. This process happens gradually over a span of months to years, usually between the ages of 27 and 52. Menopause is complete when you have missed 12 consecutive menstrual periods. It is important to talk with your health care provider about some of the most common conditions that affect postmenopausal women, such as heart disease, cancer, and bone loss (osteoporosis). Adopting a healthy lifestyle and getting preventive care can help to promote your health and wellness. Those actions can also lower your chances of developing some of these common conditions. WHAT SHOULD I KNOW ABOUT MENOPAUSE? During menopause, you may experience a number of symptoms, such as:  Moderate-to-severe hot flashes.  Night sweats.  Decrease in sex drive.  Mood swings.  Headaches.  Tiredness.  Irritability.  Memory problems.  Insomnia. Choosing to treat or not to treat menopausal changes is an individual decision that you make with your health care provider. WHAT SHOULD I KNOW ABOUT HORMONE REPLACEMENT THERAPY AND SUPPLEMENTS? Hormone therapy products are effective for treating symptoms that are associated with menopause, such as hot flashes and night sweats. Hormone replacement carries certain risks, especially as you become older. If you are thinking about using estrogen or estrogen with progestin treatments, discuss the benefits and risks with your health care provider. WHAT SHOULD I KNOW ABOUT HEART DISEASE AND STROKE? Heart disease, heart  attack, and stroke become more likely as you age. This may be due, in part, to the hormonal changes that your body experiences during menopause. These can affect how your body processes dietary fats, triglycerides, and cholesterol. Heart attack and stroke are both medical emergencies. There are many things that you can do to help prevent heart disease and stroke:  Have your blood pressure checked at least every 1-2 years. High blood pressure causes heart disease and increases the risk of stroke.  If you are 68-64 years old, ask your health care provider if you should take aspirin to prevent a heart attack or a stroke.  Do not use any tobacco products, including cigarettes, chewing tobacco, or electronic cigarettes. If you need help quitting, ask your health care provider.  It is important to eat a healthy diet and maintain a healthy weight.  Be sure to include plenty of vegetables, fruits, low-fat dairy products, and lean protein.  Avoid eating foods that are high in solid fats, added sugars, or salt (sodium).  Get regular exercise. This is one of the most important things that you can do for your health.  Try to exercise for at least 150 minutes each week. The type of exercise that you do should increase your heart rate and make you sweat. This is known as moderate-intensity exercise.  Try to do strengthening exercises at least twice each week. Do these in addition to the moderate-intensity exercise.  Know your numbers.Ask your health care provider to check your cholesterol and your blood glucose. Continue to have your blood tested as directed  by your health care provider. WHAT SHOULD I KNOW ABOUT CANCER SCREENING? There are several types of cancer. Take the following steps to reduce your risk and to catch any cancer development as early as possible. Breast Cancer  Practice breast self-awareness.  This means understanding how your breasts normally appear and feel.  It also means doing  regular breast self-exams. Let your health care provider know about any changes, no matter how small.  If you are 67 or older, have a clinician do a breast exam (clinical breast exam or CBE) every year. Depending on your age, family history, and medical history, it may be recommended that you also have a yearly breast X-ray (mammogram).  If you have a family history of breast cancer, talk with your health care provider about genetic screening.  If you are at high risk for breast cancer, talk with your health care provider about having an MRI and a mammogram every year.  Breast cancer (BRCA) gene test is recommended for women who have family members with BRCA-related cancers. Results of the assessment will determine the need for genetic counseling and BRCA1 and for BRCA2 testing. BRCA-related cancers include these types:  Breast. This occurs in males or females.  Ovarian.  Tubal. This may also be called fallopian tube cancer.  Cancer of the abdominal or pelvic lining (peritoneal cancer).  Prostate.  Pancreatic. Cervical, Uterine, and Ovarian Cancer Your health care provider may recommend that you be screened regularly for cancer of the pelvic organs. These include your ovaries, uterus, and vagina. This screening involves a pelvic exam, which includes checking for microscopic changes to the surface of your cervix (Pap test).  For women ages 21-65, health care providers may recommend a pelvic exam and a Pap test every three years. For women ages 52-65, they may recommend the Pap test and pelvic exam, combined with testing for human papilloma virus (HPV), every five years. Some types of HPV increase your risk of cervical cancer. Testing for HPV may also be done on women of any age who have unclear Pap test results.  Other health care providers may not recommend any screening for nonpregnant women who are considered low risk for pelvic cancer and have no symptoms. Ask your health care provider  if a screening pelvic exam is right for you.  If you have had past treatment for cervical cancer or a condition that could lead to cancer, you need Pap tests and screening for cancer for at least 20 years after your treatment. If Pap tests have been discontinued for you, your risk factors (such as having a new sexual partner) need to be reassessed to determine if you should start having screenings again. Some women have medical problems that increase the chance of getting cervical cancer. In these cases, your health care provider may recommend that you have screening and Pap tests more often.  If you have a family history of uterine cancer or ovarian cancer, talk with your health care provider about genetic screening.  If you have vaginal bleeding after reaching menopause, tell your health care provider.  There are currently no reliable tests available to screen for ovarian cancer. Lung Cancer Lung cancer screening is recommended for adults 68-56 years old who are at high risk for lung cancer because of a history of smoking. A yearly low-dose CT scan of the lungs is recommended if you:  Currently smoke.  Have a history of at least 30 pack-years of smoking and you currently smoke or have quit  within the past 15 years. A pack-year is smoking an average of one pack of cigarettes per day for one year. Yearly screening should:  Continue until it has been 15 years since you quit.  Stop if you develop a health problem that would prevent you from having lung cancer treatment. Colorectal Cancer  This type of cancer can be detected and can often be prevented.  Routine colorectal cancer screening usually begins at age 69 and continues through age 71.  If you have risk factors for colon cancer, your health care provider may recommend that you be screened at an earlier age.  If you have a family history of colorectal cancer, talk with your health care provider about genetic screening.  Your health  care provider may also recommend using home test kits to check for hidden blood in your stool.  A small camera at the end of a tube can be used to examine your colon directly (sigmoidoscopy or colonoscopy). This is done to check for the earliest forms of colorectal cancer.  Direct examination of the colon should be repeated every 5-10 years until age 25. However, if early forms of precancerous polyps or small growths are found or if you have a family history or genetic risk for colorectal cancer, you may need to be screened more often. Skin Cancer  Check your skin from head to toe regularly.  Monitor any moles. Be sure to tell your health care provider:  About any new moles or changes in moles, especially if there is a change in a mole's shape or color.  If you have a mole that is larger than the size of a pencil eraser.  If any of your family members has a history of skin cancer, especially at a young age, talk with your health care provider about genetic screening.  Always use sunscreen. Apply sunscreen liberally and repeatedly throughout the day.  Whenever you are outside, protect yourself by wearing long sleeves, pants, a wide-brimmed hat, and sunglasses. WHAT SHOULD I KNOW ABOUT OSTEOPOROSIS? Osteoporosis is a condition in which bone destruction happens more quickly than new bone creation. After menopause, you may be at an increased risk for osteoporosis. To help prevent osteoporosis or the bone fractures that can happen because of osteoporosis, the following is recommended:  If you are 61-106 years old, get at least 1,000 mg of calcium and at least 600 mg of vitamin D per day.  If you are older than age 82 but younger than age 42, get at least 1,200 mg of calcium and at least 600 mg of vitamin D per day.  If you are older than age 26, get at least 1,200 mg of calcium and at least 800 mg of vitamin D per day. Smoking and excessive alcohol intake increase the risk of osteoporosis. Eat  foods that are rich in calcium and vitamin D, and do weight-bearing exercises several times each week as directed by your health care provider. WHAT SHOULD I KNOW ABOUT HOW MENOPAUSE AFFECTS [place]? Depression may occur at any age, but it is more common as you become older. Common symptoms of depression include:  Low or sad mood.  Changes in sleep patterns.  Changes in appetite or eating patterns.  Feeling an overall lack of motivation or enjoyment of activities that you previously enjoyed.  Frequent crying spells. Talk with your health care provider if you think that you are experiencing depression. WHAT SHOULD I KNOW ABOUT IMMUNIZATIONS? It is important that you get  and maintain your immunizations. These include:  Tetanus, diphtheria, and pertussis (Tdap) booster vaccine.  Influenza every year before the flu season begins.  Pneumonia vaccine.  Shingles vaccine. Your health care provider may also recommend other immunizations.   This information is not intended to replace advice given to you by your health care provider. Make sure you discuss any questions you have with your health care provider.   Document Released: 09/11/2005 Document Revised: 08/10/2014 Document Reviewed: 03/22/2014 Elsevier Interactive Patient Education [employer].

## 2015-06-13 ENCOUNTER — Encounter: Payer: Self-pay | Admitting: Internal Medicine

## 2015-06-13 LAB — CYTOLOGY - PAP

## 2015-06-13 NOTE — Assessment & Plan Note (Signed)

## 2015-06-13 NOTE — Assessment & Plan Note (Signed)
LDL has risen compared to last year  But 10 yr risk is still < 7% using the FRC.  will improve with marathon training.   Lab Results  Component Value Date   CHOL 254* 06/05/2015   HDL 84.90 06/05/2015   LDLCALC 141* 06/05/2015   LDLDIRECT 137.0 06/05/2015   TRIG 140.0 06/05/2015   CHOLHDL 3 06/05/2015

## 2015-06-14 ENCOUNTER — Encounter: Payer: Self-pay | Admitting: Internal Medicine

## 2015-06-23 ENCOUNTER — Encounter: Payer: Self-pay | Admitting: Internal Medicine

## 2015-07-02 ENCOUNTER — Encounter: Payer: Self-pay | Admitting: Internal Medicine

## 2015-07-02 ENCOUNTER — Ambulatory Visit: Payer: BC Managed Care – PPO | Admitting: Family Medicine

## 2015-07-02 LAB — HM MAMMOGRAPHY

## 2015-07-03 ENCOUNTER — Encounter: Payer: Self-pay | Admitting: Internal Medicine

## 2015-07-03 ENCOUNTER — Ambulatory Visit (INDEPENDENT_AMBULATORY_CARE_PROVIDER_SITE_OTHER): Payer: BC Managed Care – PPO | Admitting: Internal Medicine

## 2015-07-03 VITALS — BP 148/90 | HR 99 | Temp 98.2°F | Resp 12 | Ht 63.0 in | Wt 130.0 lb

## 2015-07-03 DIAGNOSIS — R0789 Other chest pain: Secondary | ICD-10-CM | POA: Diagnosis not present

## 2015-07-03 DIAGNOSIS — K2100 Gastro-esophageal reflux disease with esophagitis, without bleeding: Secondary | ICD-10-CM

## 2015-07-03 DIAGNOSIS — R030 Elevated blood-pressure reading, without diagnosis of hypertension: Secondary | ICD-10-CM | POA: Diagnosis not present

## 2015-07-03 DIAGNOSIS — R079 Chest pain, unspecified: Secondary | ICD-10-CM | POA: Diagnosis not present

## 2015-07-03 MED ORDER — OMEPRAZOLE 40 MG PO CPDR: 40.0000 mg | DELAYED_RELEASE_CAPSULE | Freq: Every day | ORAL | Status: DC

## 2015-07-03 NOTE — Progress Notes (Signed)
Subjective:  Patient ID: Michele Hess, female    DOB: [birth year]  Age: 59 y.o. MRN: [id number]  CC: The primary encounter diagnosis was Chest pain, unspecified chest pain type. Diagnoses of Reflux esophagitis, Transient elevated blood pressure, Gastroesophageal reflux disease with esophagitis, and Chest pain, atypical were also pertinent to this visit. [name] presents for follow up on new onset hyperertension.    She has also had 2 episodes of intermittent chest pain  Occurred while reclining, not while running  been having chest pain . Separate episodes of left arm pain brought on by lying on right side. Has been walking,  Jogging and doing cardio videos,   Husband is having a heart cath today as an outpatient.     Outpatient Prescriptions Prior to Visit  Medication Sig Dispense Refill  . Calcium-Vitamin D 600-125 MG-UNIT TABS Take by mouth 2 (two) times daily.      . Multiple Vitamins-Iron (MULTIVITAMIN/IRON) TABS Take by mouth daily.      . NON FORMULARY Estriol 1 mg/ Testosterone .5 mg Vag tab: twice weekly     . NON FORMULARY PROGESTERONE CREAM 5% WITH DHEA 10MG /ML: 1/2 mL 25 days/month     . Omega-3 Fatty Acids (FISH OIL) 1200 MG CAPS Take 2 capsules by mouth daily.       No facility-administered medications prior to visit.    Review of Systems;  Patient denies headache, fevers, malaise, unintentional weight loss, skin rash, eye pain, sinus congestion and sinus pain, sore throat, dysphagia,  hemoptysis , cough, dyspnea, wheezing, palpitations, orthopnea, edema, abdominal pain, nausea, melena, diarrhea, constipation, flank pain, dysuria, hematuria, urinary  Frequency, nocturia, numbness, tingling, seizures,  Focal weakness, Loss of consciousness,  Tremor, insomnia, depression, anxiety, and suicidal ideation.      Objective:  BP 148/90 mmHg  Pulse 99  Temp(Src) 98.2 F (36.8 C) (Oral)  Resp 12  Ht 5\' 3"  (1.6 m)  Wt 130 lb (58.968 kg)  BMI 23.03 kg/m2  SpO2  99%  BP Readings from Last 3 Encounters:  07/03/15 148/90  06/12/15 130/70  07/13/14 120/88    Wt Readings from Last 3 Encounters:  07/03/15 130 lb (58.968 kg)  06/12/15 132 lb (59.875 kg)  07/13/14 132 lb 8 oz (60.102 kg)    General appearance: alert, cooperative and appears stated age Ears: normal TM's and external ear canals both ears Throat: lips, mucosa, and tongue normal; teeth and gums normal Neck: no adenopathy, no carotid bruit, supple, symmetrical, trachea midline and thyroid not enlarged, symmetric, no tenderness/mass/nodules Back: symmetric, no curvature. ROM normal. No CVA tenderness. Lungs: clear to auscultation bilaterally Heart: regular rate and rhythm, S1, S2 normal, no murmur, click, rub or gallop Abdomen: soft, non-tender; bowel sounds normal; no masses,  no organomegaly Pulses: 2+ and symmetric Skin: Skin color, texture, turgor normal. No rashes or lesions Lymph nodes: Cervical, supraclavicular, and axillary nodes normal.  No results found for: HGBA1C  Lab Results  Component Value Date   CREATININE 0.57 06/05/2015   CREATININE 0.7 07/11/2014   CREATININE 0.7 06/06/2013    Lab Results  Component Value Date   WBC 4.8 07/11/2014   HGB 13.7 07/11/2014   HCT 41.4 07/11/2014   PLT 295.0 07/11/2014   GLUCOSE 101* 06/05/2015   CHOL 254* 06/05/2015   TRIG 140.0 06/05/2015   HDL 84.90 06/05/2015   LDLDIRECT 137.0 06/05/2015   LDLCALC 141* 06/05/2015   ALT 7 06/05/2015   AST 20 06/05/2015   NA 137  06/05/2015   K 5.0 06/05/2015   CL 101 06/05/2015   CREATININE 0.57 06/05/2015   BUN 12 06/05/2015   CO2 31 06/05/2015   TSH 2.19 06/05/2015    No results found.  Assessment & Plan:   Problem List Items Addressed This Visit    Transient elevated blood pressure    Home readings reviewed .  The majority are normal  No medication needed.       GERD (gastroesophageal reflux disease)    Suggested by recent episodes of nonexertional chest pain  Brought  on by reclining after eating late.  Advised to change diet, change eating habits, and start a PPI.       Relevant Medications   omeprazole (PRILOSEC) 40 MG capsule   Chest pain, atypical    She has no risk factors for CAD,  And her history is consistent with esophagitis.   Will start PPI and reassess in a few weeks        Other Visit Diagnoses    Chest pain, unspecified chest pain type    -  Primary    Relevant Orders    EKG 12-Lead (Completed)    Reflux esophagitis          A total of 25 minutes of face to face time was spent with patient more than half of which was spent in counselling about the above mentioned conditions  and coordination of care   I am having Michele Hess start on omeprazole. I am also having her maintain her Calcium-Vitamin D, NON FORMULARY, Fish Oil, Multivitamin/Iron, and NON FORMULARY.  Meds ordered this encounter  Medications  . omeprazole (PRILOSEC) 40 MG capsule    Sig: Take 1 capsule (40 mg total) by mouth daily.    Dispense:  30 capsule    Refill:  3    There are no discontinued medications.  Follow-up: No Follow-up on file.   Crecencio Mc, MD

## 2015-07-03 NOTE — Patient Instructions (Signed)
Please start taking the PPI I have prescribed,  Once daily in the morning.  Send me an e mail in about three weeks and let me know if you have had any recurrences   Esophagitis Esophagitis is inflammation of the esophagus. The esophagus is the tube that carries food and liquids from your mouth to your stomach. Esophagitis can cause soreness or pain in the esophagus. This condition can make it difficult and painful to swallow.  CAUSES Most causes of esophagitis are not serious. Common causes of this condition include:  Gastroesophageal reflux disease (GERD). This is when stomach contents move back up into the esophagus (reflux).  Repeated vomiting.  An allergic-type reaction, especially caused by food allergies (eosinophilic esophagitis).  Injury to the esophagus by swallowing large pills with or without water, or swallowing certain types of medicines.  Swallowing (ingesting) harmful chemicals, such as household cleaning products.  Heavy alcohol use.  An infection of the esophagus.This most often occurs in people who have a weakened immune system.  Radiation or chemotherapy treatment for cancer.  Certain diseases such as sarcoidosis, Crohn disease, and scleroderma. SYMPTOMS Symptoms of this condition include:  Difficult or painful swallowing.  Pain with swallowing acidic liquids, such as citrus juices.  Pain with burping.  Chest pain.  Difficulty breathing.  Nausea.  Vomiting.  Pain in the abdomen.  Weight loss.  Ulcers in the mouth.  Patches of white material in the mouth (candidiasis).  Fever.  Coughing up blood or vomiting blood.  Stool that is black, tarry, or bright red. DIAGNOSIS Your health care provider will take a medical history and perform a physical exam. You may also have other tests, including:  An endoscopy to examine your stomach and esophagus with a small camera.  A test that measures the acidity level in your esophagus.  A test that  measures how much pressure is on your esophagus.  A barium swallow or modified barium swallow to show the shape, size, and functioning of your esophagus.  Allergy tests. TREATMENT Treatment for this condition depends on the cause of your esophagitis. In some cases, steroids or other medicines may be given to help relieve your symptoms or to treat the underlying cause of your condition. You may have to make some lifestyle changes, such as:  Avoiding alcohol.  Quitting smoking.  Changing your diet.  Exercising.  Changing your sleep habits and your sleep environment. HOME CARE INSTRUCTIONS Take these actions to decrease your discomfort and to help avoid complications. Diet  Follow a diet as recommended by your health care provider. This may involve avoiding foods and drinks such as:  Coffee and tea (with or without caffeine).  Drinks that contain alcohol.  Energy drinks and sports drinks.  Carbonated drinks or sodas.  Chocolate and cocoa.  Peppermint and mint flavorings.  Garlic and onions.  Horseradish.  Spicy and acidic foods, including peppers, chili powder, curry powder, vinegar, hot sauces, and barbecue sauce.  Citrus fruit juices and citrus fruits, such as oranges, lemons, and limes.  Tomato-based foods, such as red sauce, chili, salsa, and pizza with red sauce.  Fried and fatty foods, such as donuts, french fries, potato chips, and high-fat dressings.  High-fat meats, such as hot dogs and fatty cuts of red and white meats, such as rib eye steak, sausage, ham, and bacon.  High-fat dairy items, such as whole milk, butter, and cream cheese.  Eat small, frequent meals instead of large meals.  Avoid drinking large amounts of liquid with  your meals.  Avoid eating meals during the 2-3 hours before bedtime.  Avoid lying down right after you eat.  Do not exercise right after you eat.  Avoid foods and drinks that seem to make your symptoms worse. General  Instructions  Pay attention to any changes in your symptoms.  Take over-the-counter and prescription medicines only as told by your health care provider. Do not take aspirin, ibuprofen, or other NSAIDs unless your health care provider told you to do so.  If you have trouble taking pills, use a pill splitter to decrease the size of the pill. This will decrease the chance of the pill getting stuck or injuring your esophagus on the way down. Also, drink water after you take a pill.  Do not use any tobacco products, including cigarettes, chewing tobacco, and e-cigarettes. If you need help quitting, ask your health care provider.  Wear loose-fitting clothing. Do not wear anything tight around your waist that causes pressure on your abdomen.  Raise (elevate) the head of your bed about 6 inches (15 cm).  Try to reduce your stress, such as with yoga or meditation. If you need help reducing stress, ask your health care provider.  If you are overweight, reduce your weight to an amount that is healthy for you. Ask your health care provider for guidance about a safe weight loss goal.  Keep all follow-up visits as told by your health care provider. This is important. SEEK MEDICAL CARE IF:  You have new symptoms.  You have unexplained weight loss.  You have difficulty swallowing, or it hurts to swallow.  You have wheezing or a persistent cough.  Your symptoms do not improve with treatment.  You have frequent heartburn for more than two weeks. SEEK IMMEDIATE MEDICAL CARE IF:  You have severe pain in your arms, neck, jaw, teeth, or back.  You feel sweaty, dizzy, or light-headed.  You have chest pain or shortness of breath.  You vomit and your vomit looks like blood or coffee grounds.  Your stool is bloody or black.  You have a fever.  You cannot swallow, drink, or eat.   This information is not intended to replace advice given to you by your health care provider. Make sure you discuss  any questions you have with your health care provider.   Document Released: 08/27/2004 Document Revised: 04/10/2015 Document Reviewed: 11/14/2014 Elsevier Interactive Patient Education [employer].

## 2015-07-03 NOTE — Progress Notes (Signed)
Pre-visit discussion using our clinic review tool. No additional management support is needed unless otherwise documented below in the visit note.  

## 2015-07-05 DIAGNOSIS — R030 Elevated blood-pressure reading, without diagnosis of hypertension: Secondary | ICD-10-CM | POA: Insufficient documentation

## 2015-07-05 DIAGNOSIS — R0789 Other chest pain: Secondary | ICD-10-CM | POA: Insufficient documentation

## 2015-07-05 DIAGNOSIS — K219 Gastro-esophageal reflux disease without esophagitis: Secondary | ICD-10-CM | POA: Insufficient documentation

## 2015-07-05 NOTE — Assessment & Plan Note (Signed)
She has no risk factors for CAD,  And her history is consistent with esophagitis.   Will start PPI and reassess in a few weeks

## 2015-07-05 NOTE — Assessment & Plan Note (Signed)
Home readings reviewed .  The majority are normal  No medication needed.

## 2015-07-05 NOTE — Assessment & Plan Note (Signed)
Suggested by recent episodes of nonexertional chest pain  Brought on by reclining after eating late.  Advised to change diet, change eating habits, and start a PPI.

## 2015-07-15 ENCOUNTER — Encounter: Payer: Self-pay | Admitting: Internal Medicine

## 2015-07-26 ENCOUNTER — Encounter: Payer: Self-pay | Admitting: Internal Medicine

## 2015-09-02 ENCOUNTER — Encounter: Payer: Self-pay | Admitting: Internal Medicine

## 2015-09-04 ENCOUNTER — Other Ambulatory Visit: Payer: Self-pay | Admitting: Internal Medicine

## 2015-09-04 DIAGNOSIS — M25512 Pain in left shoulder: Principal | ICD-10-CM

## 2015-09-04 DIAGNOSIS — G8929 Other chronic pain: Secondary | ICD-10-CM

## 2015-09-04 MED ORDER — OMEPRAZOLE 40 MG PO CPDR: 40.0000 mg | DELAYED_RELEASE_CAPSULE | Freq: Two times a day (BID) | ORAL | Status: DC | PRN

## 2015-09-11 ENCOUNTER — Encounter: Payer: Self-pay | Admitting: Internal Medicine

## 2015-09-11 NOTE — Telephone Encounter (Signed)
OK to fill

## 2015-09-12 ENCOUNTER — Encounter: Payer: Self-pay | Admitting: Gastroenterology

## 2015-09-12 ENCOUNTER — Encounter: Payer: Self-pay | Admitting: Internal Medicine

## 2015-09-12 ENCOUNTER — Other Ambulatory Visit: Payer: Self-pay | Admitting: Internal Medicine

## 2015-09-12 DIAGNOSIS — K2100 Gastro-esophageal reflux disease with esophagitis, without bleeding: Secondary | ICD-10-CM

## 2015-09-12 MED ORDER — KETOCONAZOLE 2 % EX SHAM: 1.0000 "application " | MEDICATED_SHAMPOO | CUTANEOUS | Status: DC

## 2015-09-13 MED ORDER — KETOCONAZOLE 2 % EX CREA: 1.0000 "application " | TOPICAL_CREAM | Freq: Every day | CUTANEOUS | Status: DC

## 2015-09-13 NOTE — Telephone Encounter (Signed)
Cream order signed  shampoo dc'd  Thanks!

## 2015-09-13 NOTE — Telephone Encounter (Signed)
Can I send the cream I have pended the medication.

## 2015-09-26 ENCOUNTER — Ambulatory Visit: Payer: BC Managed Care – PPO | Admitting: Physical Therapy

## 2015-10-01 ENCOUNTER — Encounter: Payer: BC Managed Care – PPO | Admitting: Physical Therapy

## 2015-10-03 ENCOUNTER — Encounter: Payer: BC Managed Care – PPO | Admitting: Physical Therapy

## 2015-10-08 ENCOUNTER — Encounter: Payer: BC Managed Care – PPO | Admitting: Physical Therapy

## 2015-10-10 ENCOUNTER — Encounter: Payer: BC Managed Care – PPO | Admitting: Physical Therapy

## 2015-10-10 ENCOUNTER — Telehealth: Payer: Self-pay | Admitting: Internal Medicine

## 2015-10-10 NOTE — Telephone Encounter (Signed)
Pt dropped off a MMR Vaccinations consent form that she would like to have scanned into her health record. Paper is in Dr. Lupita Dawn box.  Thanks

## 2015-10-18 ENCOUNTER — Encounter: Payer: Self-pay | Admitting: Internal Medicine

## 2015-11-01 ENCOUNTER — Ambulatory Visit (INDEPENDENT_AMBULATORY_CARE_PROVIDER_SITE_OTHER): Payer: BC Managed Care – PPO | Admitting: Gastroenterology

## 2015-11-01 ENCOUNTER — Encounter: Payer: Self-pay | Admitting: Gastroenterology

## 2015-11-01 VITALS — BP 138/82 | HR 68 | Ht 63.0 in | Wt 125.8 lb

## 2015-11-01 DIAGNOSIS — K219 Gastro-esophageal reflux disease without esophagitis: Secondary | ICD-10-CM | POA: Diagnosis not present

## 2015-11-01 DIAGNOSIS — R1310 Dysphagia, unspecified: Secondary | ICD-10-CM

## 2015-11-01 MED ORDER — RANITIDINE HCL 150 MG PO TABS: 150.0000 mg | ORAL_TABLET | Freq: Two times a day (BID) | ORAL | Status: DC

## 2015-11-01 MED ORDER — OMEPRAZOLE 20 MG PO CPDR: 20.0000 mg | DELAYED_RELEASE_CAPSULE | Freq: Every day | ORAL | Status: DC

## 2015-11-01 NOTE — Progress Notes (Signed)
HPI :  60 y/o female with history of osteopenia seen here in consultation for Dr. Deborra Medina for suspected GERD. She had previously seen Dr. Olevia Perches in 2009 but has not follow up in recent years, new to me.  Patient reports her symptoms started this past November 2016 - her symptoms were chest discomfort in the mid chest with some radiation to right chest / shoulder. She also endorsed some some pyrosis / tightness in her chest. She does not think this is typically related to eating. She avoids more traditional trigger foods and she does not think it has helped. She feels it mostly early evening, and can bother her at night and interrupt sleep. If she walks and sits up it can help. She can wake up with it as well and it has interrupted her sleep. She denies dysphagia to solids. When she swallows pills she can feel dysphagia in her mid chest. No dysphagia to liquids. No odynophagia. No nausea or vomiting. No postrpandial abdominal pains.  No weight loss. She is training for a half marathon and has an excellent exercise capacity She has had a prior evaluation by cardiology in 2010 for sinus tachycardia, and had an echocardiogram and stress test with good capacity, done in 2010. She has had an EKG in November which did not show any ischemic changes. She runs without chest pains or shortness of breath.   She reports she was given prilosec in November has had good success with PPI and it has resolved her symptoms. She started at 40mg  q day and then titrated up to BID and has been going back and forth on dosing depending on her symptoms. If she takes it twice per day she will have no symptoms. She tried has decreased to 40mg  once daily in recent weeks and she continues to feel well, without significant breakthrough. She had maybe one small episode recently but nothing significant.   Colonoscopy 10/21/2007 - no polyps  Past Medical History  Diagnosis Date  . History of ETT     myoview 7/09: 9 min 31 sec, EF  71%, nov evidence for ischemia. ETT 99/10)" 1030", excellent exercise tolerance, no abnormal rhythms, no evidence for ischemia  . HLD (hyperlipidemia)   . Menopausal and postmenopausal disorder   . GERD (gastroesophageal reflux disease)    - osteopenia   Past Surgical History  Procedure Laterality Date  . Partial hysterectomy    . Echo (other)  9/10    EF 60%, lv normal, RV normal, mild MR, trivial pericardial effusion   . Total abdominal hysterectomy  [facility]   Family History  Problem Relation Age of Onset  . Heart attack Mother 40  . Heart attack Father 49   Social History  Substance Use Topics  . Smoking status: Former Smoker    Quit date: 05/15/1987  . Smokeless tobacco: Never Used     Comment: quit 27 years ago   . Alcohol Use: Yes     Comment: rare   Current Outpatient Prescriptions  Medication Sig Dispense Refill  . Calcium-Vitamin D 600-125 MG-UNIT TABS Take by mouth 2 (two) times daily.      Marland Kitchen ketoconazole (NIZORAL) 2 % shampoo Apply 1 application topically 2 (two) times a week. Use as needed for a rash from sweating.    . Multiple Vitamins-Iron (MULTIVITAMIN/IRON) TABS Take by mouth daily.      . NON FORMULARY Estriol 1 mg/ Testosterone .5 mg Vag tab: twice weekly     .  NON FORMULARY PROGESTERONE CREAM 5% WITH DHEA 10MG /ML: 1/2 mL 25 days/month     . Omega-3 Fatty Acids (FISH OIL) 1200 MG CAPS Take 2 capsules by mouth daily.      Marland Kitchen omeprazole (PRILOSEC) 20 MG capsule Take 1 capsule (20 mg total) by mouth daily. 90 capsule 3  . ranitidine (ZANTAC) 150 MG tablet Take 1 tablet (150 mg total) by mouth 2 (two) times daily. 120 tablet 3   No current facility-administered medications for this visit.   Allergies  Allergen Reactions  . Neomycin      Review of Systems: All systems reviewed and negative except where noted in HPI.   Lab Results  Component Value Date   WBC 4.8 07/11/2014   HGB 13.7 07/11/2014   HCT 41.4 07/11/2014   MCV 90.0 07/11/2014     PLT 295.0 07/11/2014    Lab Results  Component Value Date   CREATININE 0.57 06/05/2015   BUN 12 06/05/2015   NA 137 06/05/2015   K 5.0 06/05/2015   CL 101 06/05/2015   CO2 31 06/05/2015    Lab Results  Component Value Date   ALT 7 06/05/2015   AST 20 06/05/2015   ALKPHOS 53 06/05/2015   BILITOT 0.6 06/05/2015     Physical Exam: BP 138/82 mmHg  Pulse 68  Ht 5\' 3"  (1.6 m)  Wt 125 lb 12.8 oz (57.063 kg)  BMI 22.29 kg/m2 Constitutional: Pleasant,well-developed, female in no acute distress. HEENT: Normocephalic and atraumatic. Conjunctivae are normal. No scleral icterus. Neck supple.  Cardiovascular: Normal rate, regular rhythm.  Pulmonary/chest: Effort normal and breath sounds normal. No wheezing, rales or rhonchi. Abdominal: Soft, nondistended, nontender. Bowel sounds active throughout. There are no masses palpable. No hepatomegaly. Extremities: no edema Lymphadenopathy: No cervical adenopathy noted. Neurological: Alert and oriented to person place and time. Skin: Skin is warm and dry. No rashes noted. Psychiatric: Normal mood and affect. Behavior is normal.   ASSESSMENT AND PLAN: 60 y/o female presenting with symptoms of GERD and dysphagia to pills. History as above. Generally her symptoms have responded quite well to PPI thus far, and thus GERD appears to be the most likely cause of her symptoms. She has had some ongoing dysphagia to pills since this has started and in light of his I offered her an EGD to evaluate and potentially dilate any stenosis that is encountered which could be causing this. Otherwise I discussed GERD in general, natural history, associated risks, and treatment options. I discussed the risks of PPIs with her. The risks of long term PPIs with current data include increased risk for chronic kidney disease, increased risk of fracture, increased risk of C diff, increased risk of pneumonia (short term usage), potentially increased risk of B12 / calcium  deficiency, and rare risk of hypomagnesemia. Recent studies have also shown an association with increased risk of dementia and cardiovascular outcomes including stroke. These studies have showed an association between PPIs and several of these outcomes but no evidence of causality. The patient was counseled to use the lowest daily use of PPI needed to control symptoms or to use an alternative agent. In this light we will try to wean prilosec to 20mg  daily and will add zantac to use 150mg  PRN, especially given her osteopenia. Over time, she can try to wean off PPI and use zantac more routinely and see if symptoms can be managed this way. If she does not tolerate coming off PPIs, she can go back on it and increase  the dose as needed, understanding the potential risks. She agreed.   The indications, risks, and benefits of EGD were explained to the patient in detail. Risks include but are not limited to bleeding, perforation, adverse reaction to medications, and cardiopulmonary compromise. Sequelae include but are not limited to the possibility of surgery, hospitalization, and mortality. The patient verbalized understanding and wished to proceed. All questions answered, referred to scheduler. Further recommendations pending results of the exam.   [place]rap[place]oe Cellar, MD [facility] Pager [phone]  CC: Crecencio Mc, MD

## 2015-11-01 NOTE — Patient Instructions (Addendum)
If you are age 59 or older, your body mass index should be between 23-30. Your Body mass index is 22.29 kg/(m^2). If this is out of the aforementioned range listed, please consider follow up with your Primary Care Provider.  If you are age 90 or younger, your body mass index should be between 19-25. Your Body mass index is 22.29 kg/(m^2). If this is out of the aformentioned range listed, please consider follow up with your Primary Care Provider.    You have been scheduled for an endoscopy. Please follow written instructions given to you at your visit today. If you use inhalers (even only as needed), please bring them with you on the day of your procedure. Your physician has requested that you go to www.startemmi.com and enter the access code given to you at your visit today. This web site gives a general overview about your procedure. However, you should still follow specific instructions given to you by our office regarding your preparation for the procedure.  We have sent in omeprazole 20 mg to your pharmacy We have sent Zantac 150 mg to use up to twice daily as needed

## 2015-12-02 ENCOUNTER — Encounter: Payer: Self-pay | Admitting: Gastroenterology

## 2015-12-13 ENCOUNTER — Ambulatory Visit (AMBULATORY_SURGERY_CENTER): Payer: BC Managed Care – PPO | Admitting: Gastroenterology

## 2015-12-13 ENCOUNTER — Encounter: Payer: Self-pay | Admitting: Gastroenterology

## 2015-12-13 VITALS — BP 112/79 | HR 76 | Temp 98.6°F | Resp 19 | Ht 63.0 in | Wt 125.0 lb

## 2015-12-13 DIAGNOSIS — R1310 Dysphagia, unspecified: Secondary | ICD-10-CM

## 2015-12-13 MED ORDER — SODIUM CHLORIDE 0.9 % IV SOLN: 500.0000 mL | INTRAVENOUS | Status: DC

## 2015-12-13 NOTE — Progress Notes (Signed)
Called to room to assist during endoscopic procedure.  Patient ID and intended procedure confirmed with present staff. Received instructions for my participation in the procedure from the performing physician.  

## 2015-12-13 NOTE — Progress Notes (Signed)
A and Ox 3 Report to RN 

## 2015-12-13 NOTE — Op Note (Signed)
[place] Patient Name: Michele Hess Procedure Date: 12/13/2015 9:33 AM MRN: [id number] Endoscopist: Remo Lipps P. Havery Moros , MD Age: 60 Date of Birth: 09-25-1955 Gender: Female Procedure:                Upper GI endoscopy Indications:              Dysphagia, Heartburn Medicines:                Monitored Anesthesia Care Procedure:                Pre-Anesthesia Assessment:                           - Prior to the procedure, a History and Physical                            was performed, and patient medications and                            allergies were reviewed. The patient's tolerance of                            previous anesthesia was also reviewed. The risks                            and benefits of the procedure and the sedation                            options and risks were discussed with the patient.                            All questions were answered, and informed consent                            was obtained. Prior Anticoagulants: The patient has                            taken no previous anticoagulant or antiplatelet                            agents. ASA Grade Assessment: II - A patient with                            mild systemic disease. After reviewing the risks                            and benefits, the patient was deemed in                            satisfactory condition to undergo the procedure.                           After obtaining informed consent, the endoscope was  passed under direct vision. Throughout the                            procedure, the patient's blood pressure, pulse, and                            oxygen saturations were monitored continuously. The                            Model GIF-HQ190 [id number]) scope was introduced                            through the mouth, and advanced to the second part                            of duodenum. The upper GI endoscopy was    accomplished without difficulty. The patient                            tolerated the procedure well. Scope In: Scope Out: Findings:                 Esophagogastric landmarks were identified: the                            Z-line was found at 36 cm, the gastroesophageal                            junction was found at 36 cm and the upper extent of                            the gastric folds was found at 37 cm from the                            incisors.                           A 1 cm hiatal hernia was present.                           The exam of the esophagus was otherwise normal. No                            pathology was noted to have caused dysphagia, and                            while no inflammatory changes were noted random                            biopsies were taken to rule out eosinophilic                            esophagitis.  Biopsies were taken with a cold forceps in the                            middle third of the esophagus and in the lower                            third of the esophagus for histology.                           Patchy mildly erythematous mucosa without bleeding                            was found in the gastric antrum, with a small                            amount of old heme. Biopsies were taken with a cold                            forceps for Helicobacter pylori testing from the                            antrum and body.                           The exam of the stomach was otherwise normal.                           The duodenal bulb and second portion of the                            duodenum were normal. Complications:            No immediate complications. Estimated blood loss:                            Minimal. Estimated Blood Loss:     Estimated blood loss was minimal. Impression:               - Esophagogastric landmarks identified.                           - 1 cm hiatal hernia. Otherwise no  pathology noted                            to cause dysphagia, no stenosis / stricture,                            biopsies taken to rule out EoE.                           - Erythematous mucosa in the antrum. Biopsied.                           - Normal duodenal bulb and second portion of the  duodenum.                           - Biopsies were taken with a cold forceps for                            histology in the middle third of the esophagus and                            in the lower third of the esophagus. Recommendation:           - Patient has a contact number available for                            emergencies. The signs and symptoms of potential                            delayed complications were discussed with the                            patient. Return to normal activities tomorrow.                            Written discharge instructions were provided to the                            patient.                           - Resume previous diet.                           - Continue present medications.                           - Await pathology results.                           - No repeat upper endoscopy. Remo Lipps P. Anysha Frappier, MD 12/13/2015 9:53:34 AM This report has been signed electronically.

## 2015-12-13 NOTE — Patient Instructions (Addendum)
YOU HAD AN ENDOSCOPIC PROCEDURE TODAY AT [facility]:   Refer to the procedure report that was given to you for any specific questions about what was found during the examination.  If the procedure report does not answer your questions, please call your gastroenterologist to clarify.  If you requested that your care partner not be given the details of your procedure findings, then the procedure report has been included in a sealed envelope for you to review at your convenience later.  YOU SHOULD EXPECT: Some feelings of bloating in the abdomen. Passage of more gas than usual.  Walking can help get rid of the air that was put into your GI tract during the procedure and reduce the bloating. If you had a lower endoscopy (such as a colonoscopy or flexible sigmoidoscopy) you may notice spotting of blood in your stool or on the toilet paper. If you underwent a bowel prep for your procedure, you may not have a normal bowel movement for a few days.  Please Note:  You might notice some irritation and congestion in your nose or some drainage.  This is from the oxygen used during your procedure.  There is no need for concern and it should clear up in a day or so.  SYMPTOMS TO REPORT IMMEDIATELY:   Following lower endoscopy (colonoscopy or flexible sigmoidoscopy):  Excessive amounts of blood in the stool  Significant tenderness or worsening of abdominal pains  Swelling of the abdomen that is new, acute  Fever of 100F or higher   For urgent or emergent issues, a gastroenterologist can be reached at any hour by calling [phone].   DIET: Your first meal following the procedure should be a small meal and then it is ok to progress to your normal diet. Heavy or fried foods are harder to digest and may make you feel nauseous or bloated.  Likewise, meals heavy in dairy and vegetables can increase bloating.  Drink plenty of fluids but you should avoid alcoholic beverages for 24  hours.  ACTIVITY:  You should plan to take it easy for the rest of today and you should NOT DRIVE or use heavy machinery until tomorrow (because of the sedation medicines used during the test).    FOLLOW UP: Our staff will call the number listed on your records the next business day following your procedure to check on you and address any questions or concerns that you may have regarding the information given to you following your procedure. If we do not reach you, we will leave a message.  However, if you are feeling well and you are not experiencing any problems, there is no need to return our call.  We will assume that you have returned to your regular daily activities without incident.  If any biopsies were taken you will be contacted by phone or by letter within the next 1-3 weeks.  Please call us at [phone] if you have not heard about the biopsies in 3 weeks.    SIGNATURES/CONFIDENTIALITY: You and/or your care partner have signed paperwork which will be entered into your electronic medical record.  These signatures attest to the fact that that the information above on your After Visit Summary has been reviewed and is understood.  Full responsibility of the confidentiality of this discharge information lies with you and/or your care-partner.  Hiatal hernia information given.  YOU HAD AN ENDOSCOPIC PROCEDURE TODAY AT THE [facility]:   Refer to the procedure report that  was given to you for any specific questions about what was found during the examination.  If the procedure report does not answer your questions, please call your gastroenterologist to clarify.  If you requested that your care partner not be given the details of your procedure findings, then the procedure report has been included in a sealed envelope for you to review at your convenience later.  YOU SHOULD EXPECT: Some feelings of bloating in the abdomen. Passage of more gas than usual.  Walking can help get  rid of the air that was put into your GI tract during the procedure and reduce the bloating. If you had a lower endoscopy (such as a colonoscopy or flexible sigmoidoscopy) you may notice spotting of blood in your stool or on the toilet paper. If you underwent a bowel prep for your procedure, you may not have a normal bowel movement for a few days.  Please Note:  You might notice some irritation and congestion in your nose or some drainage.  This is from the oxygen used during your procedure.  There is no need for concern and it should clear up in a day or so.  SYMPTOMS TO REPORT IMMEDIATELY:   Following lower endoscopy (colonoscopy or flexible sigmoidoscopy):  Excessive amounts of blood in the stool  Significant tenderness or worsening of abdominal pains  Swelling of the abdomen that is new, acute  Fever of 100F or higher   Following upper endoscopy (EGD)  Vomiting of blood or coffee ground material  New chest pain or pain under the shoulder blades  Painful or persistently difficult swallowing  New shortness of breath  Fever of 100F or higher  Black, tarry-looking stools  For urgent or emergent issues, a gastroenterologist can be reached at any hour by calling [phone].   DIET: Your first meal following the procedure should be a small meal and then it is ok to progress to your normal diet. Heavy or fried foods are harder to digest and may make you feel nauseous or bloated.  Likewise, meals heavy in dairy and vegetables can increase bloating.  Drink plenty of fluids but you should avoid alcoholic beverages for 24 hours.  ACTIVITY:  You should plan to take it easy for the rest of today and you should NOT DRIVE or use heavy machinery until tomorrow (because of the sedation medicines used during the test).    FOLLOW UP: Our staff will call the number listed on your records the next business day following your procedure to check on you and address any questions or concerns that you  may have regarding the information given to you following your procedure. If we do not reach you, we will leave a message.  However, if you are feeling well and you are not experiencing any problems, there is no need to return our call.  We will assume that you have returned to your regular daily activities without incident.  If any biopsies were taken you will be contacted by phone or by letter within the next 1-3 weeks.  Please call us at [phone] if you have not heard about the biopsies in 3 weeks.    SIGNATURES/CONFIDENTIALITY: You and/or your care partner have signed paperwork which will be entered into your electronic medical record.  These signatures attest to the fact that that the information above on your After Visit Summary has been reviewed and is understood.  Full responsibility of the confidentiality of this discharge information lies with you and/or your  care-partner.

## 2015-12-16 ENCOUNTER — Telehealth: Payer: Self-pay

## 2015-12-16 NOTE — Telephone Encounter (Signed)
  Follow up Call-  Call back number 12/13/2015  Post procedure Call Back phone  # [phone]  Permission to leave phone message Yes     Patient questions:  Do you have a fever, pain , or abdominal swelling? No. Pain Score  0 *  Have you tolerated food without any problems? Yes.    Have you been able to return to your normal activities? Yes.    Do you have any questions about your discharge instructions: Diet   No. Medications  No. Follow up visit  No.  Do you have questions or concerns about your Care? No.  Actions: * If pain score is 4 or above: No action needed, pain <4.

## 2016-01-01 ENCOUNTER — Telehealth: Payer: Self-pay | Admitting: *Deleted

## 2016-01-01 NOTE — Telephone Encounter (Signed)
Signed.

## 2016-01-01 NOTE — Telephone Encounter (Signed)
Hard copy faxed. 

## 2016-01-01 NOTE — Telephone Encounter (Signed)
Patient requesting a refill of: E3 1mg  + Test 0.5 mg vag tab.  Insert one tablet twice weekly as directed.  [facility] [address] [place], [place] [address] [phone] fax  Last dispensed: 11/05/15 Last office visit: 07/03/15 No future appointments  Okay to fill?

## 2016-01-02 DIAGNOSIS — R569 Unspecified convulsions: Secondary | ICD-10-CM

## 2016-01-02 HISTORY — DX: Unspecified convulsions: R56.9

## 2016-01-15 ENCOUNTER — Other Ambulatory Visit: Payer: Self-pay | Admitting: Internal Medicine

## 2016-01-15 ENCOUNTER — Telehealth: Payer: Self-pay | Admitting: Internal Medicine

## 2016-01-15 DIAGNOSIS — R55 Syncope and collapse: Secondary | ICD-10-CM

## 2016-01-15 DIAGNOSIS — R569 Unspecified convulsions: Secondary | ICD-10-CM

## 2016-01-15 NOTE — Telephone Encounter (Signed)
Spoke with patient.  On her last day of a cruise, while leaving the ship to come home, she had a syncopal episode.  She is not sure if was a seizure.  She was sent to the [facility] hospital where tests were done.  She has had a couple of "feeling faint episodes" in the past.  She is now taking Keppra 500 mg bid.  They have asked her to stop her hormone medication.  They would like for her to have a nuclear stress test done.  They have also suggested an MRI done by a neurologist.  If possible the patient would like to go to [facility] to be evaluated and treated by either Dr Valere Dross or Dr Jannifer Franklin.  Would you like a follow up visit with the patient?  Please advise. Information has been printed from care everywhere.

## 2016-01-15 NOTE — Telephone Encounter (Signed)
Pt would like to talk about a medical emergency that happened but didn't  want to go into detail with me about it...[phone]

## 2016-01-15 NOTE — Telephone Encounter (Signed)
Patient is aware and would like a referral to cardiology.

## 2016-01-15 NOTE — Telephone Encounter (Signed)
Cardiology referral in process as well

## 2016-01-15 NOTE — Telephone Encounter (Signed)
Neurology referral made.   Will make cardiology referral as well if she has a preference

## 2016-01-16 ENCOUNTER — Telehealth: Payer: Self-pay | Admitting: Internal Medicine

## 2016-01-16 DIAGNOSIS — R569 Unspecified convulsions: Secondary | ICD-10-CM

## 2016-01-16 NOTE — Telephone Encounter (Signed)
Last labs were in November 2016, please advise?

## 2016-01-16 NOTE — Telephone Encounter (Signed)
My chart message sent to patient:   I believe I have received all of the records from [place],  Because they use the same electronic medical record that we do. I have forwarded the records to your future neurology and cardiology appointments .I have ordered the MRI.   Regarding your hormone levels,   I do not and will not check  hormone levels in blood or saliva when prescribing hormone therapy  because there is no data that supports  doing so.   The purpose of using hormone therapy is to relieve hot flashes.    I agreed in the past to refill your  compounded drugs, but  there has been  an increasing consensus from accredited physician groups that "bio identical hormone therapy" should NOT be used because 1) compounded  Preparations are not FDA approved and therefore  2) the safety of using them has not been established because they are not standardized.   At your next visit with me we will need to discuss using traditional hormone therapy instead. I  certainly understand if  you don't want to switch,  But you will need to find a physician who is willing to take over this part of your care if htat is your decision, because I am  no longer comfortable doing it since so may other FDA approved forms of hormone therapy are available  .  Regards,   Deborra Medina, MD

## 2016-01-16 NOTE — Telephone Encounter (Signed)
Pt called wanting to know if her hormone level needs to be checked? Pt wants to know when was the last time she had her hormone level checked?  Need order please and thank you!  Call pt @ [phone]. Thank you!

## 2016-01-16 NOTE — Telephone Encounter (Signed)
When someone asks about "hormone level" please ask which one they are referring to:  Thyroid?  FHS? LH?  She is due for neither . Thyroid was normal in November,

## 2016-01-16 NOTE — Telephone Encounter (Signed)
Spoke with the patient, she is wanting to check her hormone levels as she has been on the two compounded hormone drugs and doesn't remember having labs to verify that there are helping.  She also wants to make sure all her record were received from [place].  She thought only a portion was received at our office.  Patient had done a saliva test for initial therapy.  thanks

## 2016-01-17 NOTE — Telephone Encounter (Signed)
Noted  

## 2016-01-20 ENCOUNTER — Other Ambulatory Visit: Payer: Self-pay | Admitting: Internal Medicine

## 2016-01-20 MED ORDER — DIAZEPAM 5 MG PO TABS: 5.0000 mg | ORAL_TABLET | Freq: Two times a day (BID) | ORAL | Status: DC

## 2016-01-27 ENCOUNTER — Ambulatory Visit (INDEPENDENT_AMBULATORY_CARE_PROVIDER_SITE_OTHER): Payer: BC Managed Care – PPO | Admitting: Neurology

## 2016-01-27 ENCOUNTER — Encounter: Payer: Self-pay | Admitting: Neurology

## 2016-01-27 ENCOUNTER — Telehealth: Payer: Self-pay | Admitting: Neurology

## 2016-01-27 VITALS — BP 146/81 | HR 85 | Ht 63.0 in | Wt 125.0 lb

## 2016-01-27 DIAGNOSIS — R55 Syncope and collapse: Secondary | ICD-10-CM

## 2016-01-27 HISTORY — DX: Syncope and collapse: R55

## 2016-01-27 NOTE — Progress Notes (Signed)
Reason for visit: Possible seizure  Referring physician: Dr. Donalynn Furlong is a 60 y.o. female  History of present illness:  Michele Hess is a 60 year old right-handed white female with a history of an event that occurred on 01/12/2016. The patient had just gotten off of a cruise from the [place], she was picking up her luggage. The patient began to feel woozy, and she developed tunnel vision. The patient felt poorly for about 2 minutes, she got to the point where she was afraid to continue walking, and she started yelling for her husband. By the time the husband got to her, she was surrounded by other passengers, and she has started to stiffen and jerk on the extremities. The patient was still in the standing position. Eventually, they laid her down, the patient has no recollection of the events for about 45 minutes. The patient then began responding more normally. She went to the hospital, and a CT scan of the brain was done which was unremarkable. An EEG study showed bilateral frontotemporal 5-6 Hz slowing, there were rare sharp waves in the left temporal area. MRI of the brain was done on June 23 after she returned home, this was brought for my review and appears to show minimal white matter changes, no acute changes were seen. The patient had been placed on Keppra in [place], she remains on this medication. The patient describes 2 similar episodes that occurred in the past, the first episode occurred about a year ago. The patient has had 2 events where she would feel woozy in the head, and she would have tunnel vision. The patient did not black out with these events, with one event she got to her car and sat down and felt better fairly quickly. The patient reports no focal numbness, weakness, or difficulty with balance. The patient did not bite her tongue or lose control the bowels or the bladder. She denies palpitations of the heart. She is to undergo a stress test at some point. She is  sent to this office for an evaluation.   Past Medical History  Diagnosis Date  . History of ETT     myoview 7/09: 9 min 31 sec, EF 71%, nov evidence for ischemia. ETT 99/10)" 1030", excellent exercise tolerance, no abnormal rhythms, no evidence for ischemia  . HLD (hyperlipidemia)   . Menopausal and postmenopausal disorder   . GERD (gastroesophageal reflux disease)   . Syncope and collapse 01/27/2016    Past Surgical History  Procedure Laterality Date  . Partial hysterectomy    . Echo (other)  9/10    EF 60%, lv normal, RV normal, mild MR, trivial pericardial effusion   . Total abdominal hysterectomy  1993    [name]  . Anterior cruciate ligament repair Left   . Tonsillectomy  1961    Family History  Problem Relation Age of Onset  . Heart attack Mother 27  . Heart attack Father 70  . Colon cancer Neg Hx   . Stomach cancer Neg Hx   . Lung cancer Brother   . Diabetes Mother   . Diabetes Brother     Social history:  reports that she quit smoking about 28 years ago. She has never used smokeless tobacco. She reports that she drinks alcohol. She reports that she does not use illicit drugs.  Medications:  Prior to Admission medications   Medication Sig Start Date End Date Taking? Authorizing Provider  Omega-3 Fatty Acids (FISH OIL) 1000  MG CAPS Take by mouth 2 (two) times daily.   Yes Historical Provider, MD  Calcium-Vitamin D 600-125 MG-UNIT TABS Take by mouth 2 (two) times daily.      Historical Provider, MD  ketoconazole (NIZORAL) 2 % shampoo Apply 1 application topically 2 (two) times a week. Use as needed for a rash from sweating.    Historical Provider, MD  levETIRAcetam (KEPPRA) 500 MG tablet  01/14/16   Historical Provider, MD  ranitidine (ZANTAC) 150 MG tablet Take 1 tablet (150 mg total) by mouth 2 (two) times daily. Patient not taking: Reported on 12/13/2015 11/01/15   Manus Gunning, MD      Allergies  Allergen Reactions  . Neomycin     ROS:  Out of a  complete 14 system review of symptoms, the patient complains only of the following symptoms, and all other reviewed systems are negative.  Black out Dizziness  Blood pressure 146/81, pulse 85, height 5\' 3"  (1.6 m), weight 125 lb (56.7 kg).  Physical Exam  General: The patient is alert and cooperative at the time of the examination.  Eyes: Pupils are equal, round, and reactive to light. Discs are flat bilaterally.  Neck: The neck is supple, no carotid bruits are noted.  Respiratory: The respiratory examination is clear.  Cardiovascular: The cardiovascular examination reveals a regular rate and rhythm, no obvious murmurs or rubs are noted.  Skin: Extremities are without significant edema.  Neurologic Exam  Mental status: The patient is alert and oriented x 3 at the time of the examination. The patient has apparent normal recent and remote memory, with an apparently normal attention span and concentration ability.  Cranial nerves: Facial symmetry is present. There is good sensation of the face to pinprick and soft touch bilaterally. The strength of the facial muscles and the muscles to head turning and shoulder shrug are normal bilaterally. Speech is well enunciated, no aphasia or dysarthria is noted. Extraocular movements are full. Visual fields are full. The tongue is midline, and the patient has symmetric elevation of the soft palate. No obvious hearing deficits are noted.  Motor: The motor testing reveals 5 over 5 strength of all 4 extremities. Good symmetric motor tone is noted throughout.  Sensory: Sensory testing is intact to pinprick, soft touch, vibration sensation, and position sense on all 4 extremities. No evidence of extinction is noted.  Coordination: Cerebellar testing reveals good finger-nose-finger and heel-to-shin bilaterally.  Gait and station: Gait is normal. Tandem gait is normal. Romberg is negative. No drift is seen.  Reflexes: Deep tendon reflexes are symmetric  and normal bilaterally. Toes are downgoing bilaterally.   Assessment/Plan:  1. Syncope versus seizure  The description of the episode seems to be most consistent with an event that lowered the blood pressure with a syncopal or near syncopal event. The patient has had 2 prior events over the last one year. The last event that occurred in [place] likely was associated with a true convulsive seizure, but this may have represented a syncopal seizure. I am not completely convinced that the patient needs to be on Keppra. The patient will be set up for a repeat EEG study, if this is completely normal, I will initiate a taper off of Keppra. The patient has a recurring event of wooziness and tunnel vision, she is to sit down or lie down immediately. The patient is not to operate a motor vehicle until further notice. She will follow-up in 4 months.  Michele Alexanders MD 01/27/2016 8:50  PM  [facility] [address] [place], [place] [address]  Phone [phone] Fax [phone]

## 2016-01-27 NOTE — Telephone Encounter (Signed)
Results - MRI Brain ([facility]) MRI Brain W Wo Contrast - Final result (01/24/2016 1:20 PM)  Narrative  EXAM: Magnetic resonance imaging, brain, without and with contrast material.  DATE: 01/24/2016 1:20 PM  ACCESSION: [id number] UN  DICTATED: 01/24/2016 2:39 PM  INTERPRETATION LOCATION: [place]    CLINICAL INDICATION: 60 years old Female with SEIZURES/ CONVULSIONS-R56.9-Seizures ([facility])    COMPARISON: None    TECHNIQUE: Multiplanar, multisequence MR imaging of the brain was performed without and with I.V. contrast.    FINDINGS:There are a few scattered punctate T2/FLAIR deep white matter and periventricular foci of signal abnormality, nonspecific but commonly seen with small vessel ischemic changes.There is no midline shift or mass lesion. There is no evidence of intracranial hemorrhage or acute infarct.There are no extra-axial fluid collections present.No diffusion weighted signal abnormality is identified. The hippocampi are symmetric in appearance and normal in volume and signal intensity.    There is no abnormal enhancement.      IMPRESSION: No acute intracranial pathology. Minimal T2/FLAIR signal abnormality as above. No MRI findings to explain seizure activity.

## 2016-01-27 NOTE — Telephone Encounter (Signed)
Pt called asking that our office call [facility] for MRI - phone: [phone] Pt said she will bring disc but would like report faxed over for her records.

## 2016-01-29 ENCOUNTER — Telehealth: Payer: Self-pay | Admitting: Internal Medicine

## 2016-01-29 NOTE — Telephone Encounter (Signed)
Opened in error

## 2016-02-05 ENCOUNTER — Encounter: Payer: Self-pay | Admitting: Cardiology

## 2016-02-05 ENCOUNTER — Ambulatory Visit (INDEPENDENT_AMBULATORY_CARE_PROVIDER_SITE_OTHER): Payer: BC Managed Care – PPO | Admitting: Cardiology

## 2016-02-05 VITALS — BP 140/82 | HR 85 | Ht 63.5 in | Wt 126.5 lb

## 2016-02-05 DIAGNOSIS — R55 Syncope and collapse: Secondary | ICD-10-CM

## 2016-02-05 DIAGNOSIS — E785 Hyperlipidemia, unspecified: Secondary | ICD-10-CM | POA: Diagnosis not present

## 2016-02-05 NOTE — Progress Notes (Signed)
Cardiology Office Note   Date:  02/05/2016   ID:  Michele Hess, DOB 1955/10/31, MRN [id number]  Referring Doctor:  Crecencio Mc, MD   Cardiologist:   Wende Bushy, MD   Reason for consultation:  Chief Complaint  Patient presents with  . other    Ref by Dr. Derrel Nip for syncope and had a seizure on 01/12/2016. Meds reviewed by the patient verbally.       History of Present Illness: Michele Hess is a 60 y.o. female who presents for Evaluation of syncope. 01/12/2016, patient got of a cruise ship and started feeling dizzy, fish and turning dark/tunnel vision. Movement made her symptoms worse and so she decided to call for her husband. Another person was trying to help her as she looked like she was in distress. Before she could go to the ground, she was noted to have jerking movements of her extremities. According to her husband, she had her eyes open the whole time. There was loss of consciousness for likely a few minutes. The last thing she remembered was calling for her husband. When she came to, the paramedics were assessing her. She was then sent to [facility] where she stayed for a few days. Several testing were done including EEG, CT of the head, and echocardiogram. She was then started on Pravachol due to abnormal EEG.  Prior to that, roughly 6 months ago, she had a similar but less intense episode while she was at the grocery store. She was walking and started feeling dizzy or lightheaded, vision turning dark. She did not want to ask for any help and so she focused to get to her car right away. There was no loss of consciousness.  She has had no shortness of breath, chest pain, palpitations. She is physically active and has been training for half marathons and 5Ks. She has no symptoms with exertion.   ROS:  Please see the history of present illness. Aside from mentioned under HPI, all other systems are reviewed and negative.     Past Medical History    Diagnosis Date  . History of ETT     myoview 7/09: 9 min 31 sec, EF 71%, nov evidence for ischemia. ETT 99/10)" 1030", excellent exercise tolerance, no abnormal rhythms, no evidence for ischemia  . HLD (hyperlipidemia)   . Menopausal and postmenopausal disorder   . GERD (gastroesophageal reflux disease)   . Syncope and collapse 01/27/2016    Past Surgical History  Procedure Laterality Date  . Partial hysterectomy    . Echo (other)  9/10    EF 60%, lv normal, RV normal, mild MR, trivial pericardial effusion   . Total abdominal hysterectomy  1993    [name]  . Anterior cruciate ligament repair Left   . Tonsillectomy  1961     reports that she quit smoking about 28 years ago. She has never used smokeless tobacco. She reports that she drinks alcohol. She reports that she does not use illicit drugs.   family history includes Diabetes in her brother and mother; Heart attack (age of onset: 62) in her mother; Heart attack (age of onset: 65) in her father; Lung cancer in her brother. There is no history of Colon cancer or Stomach cancer.   Outpatient Prescriptions Prior to Visit  Medication Sig Dispense Refill  . ketoconazole (NIZORAL) 2 % shampoo Apply 1 application topically 2 (two) times a week. Use as needed for a rash from sweating.    Marland Kitchen  levETIRAcetam (KEPPRA) 500 MG tablet Take 500 mg by mouth 2 (two) times daily.     . Omega-3 Fatty Acids (FISH OIL) 1000 MG CAPS Take by mouth 2 (two) times daily.    . Calcium-Vitamin D 600-125 MG-UNIT TABS Take by mouth 2 (two) times daily. Reported on 02/05/2016    . ranitidine (ZANTAC) 150 MG tablet Take 1 tablet (150 mg total) by mouth 2 (two) times daily. (Patient not taking: Reported on 02/05/2016) 120 tablet 3   No facility-administered medications prior to visit.     Allergies: Neomycin    PHYSICAL EXAM: VS:  BP 140/82 mmHg  Ht 5' 3.5" (1.613 m)  Wt 126 lb 8 oz (57.38 kg)  BMI 22.05 kg/m2 , Body mass index is 22.05 kg/(m^2). Wt Readings  from Last 3 Encounters:  02/05/16 126 lb 8 oz (57.38 kg)  01/27/16 125 lb (56.7 kg)  12/13/15 125 lb (56.7 kg)    Orthostatic VS for the past 24 hrs:  BP- Lying Pulse- Lying BP- Sitting Pulse- Sitting BP- Standing at 0 minutes Pulse- Standing at 0 minutes  02/05/16 1513 148/84 mmHg 76 143/85 mmHg 79 123/80 mmHg 88     GENERAL:  well developed, well nourished, not in acute distress HEENT: normocephalic, pink conjunctivae, anicteric sclerae, no xanthelasma, normal dentition, oropharynx clear NECK:  no neck vein engorgement, JVP normal, no hepatojugular reflux, carotid upstroke brisk and symmetric, no bruit, no thyromegaly, no lymphadenopathy LUNGS:  good respiratory effort, clear to auscultation bilaterally CV:  PMI not displaced, no thrills, no lifts, S1 and S2 within normal limits, no palpable S3 or S4, no murmurs, no rubs, no gallops ABD:  Soft, nontender, nondistended, normoactive bowel sounds, no abdominal aortic bruit, no hepatomegaly, no splenomegaly MS: nontender back, no kyphosis, no scoliosis, no joint deformities EXT:  2+ DP/PT pulses, no edema, no varicosities, no cyanosis, no clubbing SKIN: warm, nondiaphoretic, normal turgor, no ulcers NEUROPSYCH: alert, oriented to person, place, and time, sensory/motor grossly intact, normal mood, appropriate affect  Recent Labs: 06/05/2015: ALT 7; BUN 12; Creatinine, Ser 0.57; Potassium 5.0; Sodium 137; TSH 2.19   Lipid Panel    Component Value Date/Time   CHOL 254* 06/05/2015 1030   TRIG 140.0 06/05/2015 1030   HDL 84.90 06/05/2015 1030   CHOLHDL 3 06/05/2015 1030   VLDL 28.0 06/05/2015 1030   LDLCALC 141* 06/05/2015 1030   LDLDIRECT 137.0 06/05/2015 1030     Other studies Reviewed:  EKG:  The ekg from 02/05/2016 was personally reviewed by me and it revealed sinus rhythm, 85 BPM QT 394 ms.  Additional studies/ records that were reviewed personally reviewed by me today include:  Echo 01/12/2016: The left ventricle is normal in  size. Left ventricular systolic function is normal. Ejection Fraction = 65-70%. E/E' < 9 is suggestive of normal left ventricular end diastolic pressure The left ventricular wall motion is normal. The right ventricle is normal in size and function. There is trace to mild mitral regurgitation. Trace pulmonic valvular regurgitation. There is mild tricuspid regurgitation. Right ventricular systolic pressure is normal. The left atrial size is normal. Right atrial size is normal. There is no pericardial effusion.    ASSESSMENT AND PLAN:  Syncope Patient had seizure-like activity is being followed by neurology. Per patient, the plan was to repeat an EEG while on Keppra. Neurology believes that she does not have epilepsy. It is possible that this syncope was brought on by orthostatic hypotension. Her blood pressure did drop 20 points systolic on  orthostatic vital signs. She has risk factors for CAD including hyperlipidemia and family history of premature CAD. To complete workup, recommend exercise nuclear stress test, carotid ultrasound.  Hyperlipidemia ASCVD score is 6.8%. We will re-evaluate after stress testing has been done.  Current medicines are reviewed at length with the patient today.  The patient does not have concerns regarding medicines.  Labs/ tests ordered today include: Orders Placed This Encounter  Procedures  . EKG 12-Lead    I had a lengthy and detailed discussion with the patient regarding diagnoses, prognosis, diagnostic options, treatment options .   I counseled the patient on importance of lifestyle modification including heart healthy diet, regular physical activity.   Disposition:   FU with undersigned after tests   I spent at least 60 minutes with the patient today and more than 50% of the time was spent counseling the patient and coordinating care.   Signed, Wende Bushy, MD  02/05/2016 3:11 PM    [place]

## 2016-02-05 NOTE — Patient Instructions (Addendum)
Medication Instructions:  Your physician recommends that you continue on your current medications as directed. Please refer to the Current Medication list given to you today.   Labwork: None ordered  Testing/Procedures: Your physician has requested that you have a carotid duplex. This test is an ultrasound of the carotid arteries in your neck. It looks at blood flow through these arteries that supply the brain with blood. Allow one hour for this exam. There are no restrictions or special instructions.  Michele Hess  Your caregiver has ordered a Stress Test with nuclear imaging. The purpose of this test is to evaluate the blood supply to your heart muscle. Cardiac stress tests are done to find areas of poor blood flow to the heart by determining the extent of coronary artery disease (CAD).  Please note: these test may take anywhere between 2-4 hours to complete  PLEASE REPORT TO [place] AT THE FIRST DESK WILL DIRECT YOU WHERE TO GO  Date of Procedure:_Thursday February 13, 2015 at 08:00AM_______  Arrival Time for Procedure:_Arrive at 07:45AM to register___   PLEASE NOTIFY THE OFFICE AT LEAST 24 HOURS IN ADVANCE IF YOU ARE UNABLE TO [place].  [phone] AND  PLEASE NOTIFY NUCLEAR MEDICINE AT [facility] AT LEAST 24 HOURS IN ADVANCE IF YOU ARE UNABLE TO KEEP YOUR APPOINTMENT. [phone]  How to prepare for your Myoview test:   Do not eat or drink after midnight  No caffeine for 24 hours prior to test  No smoking 24 hours prior to test.  Your medication may be taken with water.  If your doctor stopped a medication because of this test, do not take that medication.  Ladies, please do not wear dresses.  Skirts or pants are appropriate. Please wear a short sleeve shirt.  No perfume, cologne or lotion.  Wear comfortable walking shoes. No heels!  Follow-Up: Your physician recommends that you schedule a follow-up appointment after testing  with Dr. Yvone Neu.  It was a pleasure seeing you today here in the office. Please do not hesitate to give [place] a call back if you have any further questions. [place], BSN      Any Other Special Instructions Will Be Listed Below (If Applicable).     If you need a refill on your cardiac medications before your next appointment, please call your pharmacy.  Cardiac Nuclear Scanning A cardiac nuclear scan is used to check your heart for problems, such as the following:  A portion of the heart is not getting enough blood.  Part of the heart muscle has died, which happens with a heart attack.  The heart wall is not working normally.  In this test, a radioactive dye (tracer) is injected into your bloodstream. After the tracer has traveled to your heart, a scanning device is used to measure how much of the tracer is absorbed by or distributed to various areas of your heart. LET [facility] CARE PROVIDER KNOW ABOUT:  Any allergies you have.  All medicines you are taking, including vitamins, herbs, eye drops, creams, and over-the-counter medicines.  Previous problems you or members of your family have had with the use of anesthetics.  Any blood disorders you have.  Previous surgeries you have had.  Medical conditions you have.  RISKS AND COMPLICATIONS Generally, this is a safe procedure. However, as with any procedure, problems can occur. Possible problems include:   Serious chest pain.  Rapid heartbeat.  Sensation of warmth in  your chest. This usually passes quickly. BEFORE THE PROCEDURE Ask your health care provider about changing or stopping your regular medicines. PROCEDURE This procedure is usually done at a hospital and takes 2-4 hours.  An IV tube is inserted into one of your veins.  Your health care provider will inject a small amount of radioactive tracer through the tube.  You will then wait for 20-40 minutes while the tracer travels through your  bloodstream.  You will lie down on an exam table so images of your heart can be taken. Images will be taken for about 15-20 minutes.  You will exercise on a treadmill or stationary bike. While you exercise, your heart activity will be monitored with an electrocardiogram (ECG), and your blood pressure will be checked.  If you are unable to exercise, you may be given a medicine to make your heart beat faster.  When blood flow to your heart has peaked, tracer will again be injected through the IV tube.  After 20-40 minutes, you will get back on the exam table and have more images taken of your heart.  When the procedure is over, your IV tube will be removed. AFTER THE PROCEDURE  You will likely be able to leave shortly after the test. Unless your health care provider tells you otherwise, you may return to your normal schedule, including diet, activities, and medicines.  Make sure you find out how and when you will get your test results.   This information is not intended to replace advice given to you by your health care provider. Make sure you discuss any questions you have with your health care provider.   Document Released: 08/14/2004 Document Revised: 07/25/2013 Document Reviewed: 06/28/2013 Elsevier Interactive Patient Education [employer].

## 2016-02-10 ENCOUNTER — Other Ambulatory Visit: Payer: BC Managed Care – PPO

## 2016-02-10 ENCOUNTER — Encounter: Payer: Self-pay | Admitting: Neurology

## 2016-02-10 ENCOUNTER — Encounter: Payer: Self-pay | Admitting: Internal Medicine

## 2016-02-10 ENCOUNTER — Other Ambulatory Visit: Payer: Self-pay

## 2016-02-10 MED ORDER — LEVETIRACETAM 500 MG PO TABS: 500.0000 mg | ORAL_TABLET | Freq: Two times a day (BID) | ORAL | Status: DC

## 2016-02-10 NOTE — Telephone Encounter (Signed)
Keppra refilled to get through EEG on 02/24/16 then pt may taper of medication (depeneding on results) per Dr. Jannifer Franklin' notes.

## 2016-02-11 ENCOUNTER — Telehealth: Payer: Self-pay | Admitting: Cardiology

## 2016-02-11 NOTE — Telephone Encounter (Signed)
Spoke with patient and reviewed instructions for her stress test. Let her know that insurance was reviewing case and that we may have to change it but waiting to hear back. She verbalized understanding of our conversation, instructions, and had no further questions at this time. Let her know that I would call her back if there were any changes that need to be made.

## 2016-02-11 NOTE — Telephone Encounter (Signed)
Patient called and is having a myoview on 02/13/16 and is on levETIRAcetam (KEPPRA) 500 MG tablet. Does she still to take this in the morning?

## 2016-02-12 ENCOUNTER — Ambulatory Visit: Payer: BC Managed Care – PPO

## 2016-02-12 ENCOUNTER — Telehealth: Payer: Self-pay | Admitting: Cardiology

## 2016-02-12 DIAGNOSIS — R55 Syncope and collapse: Secondary | ICD-10-CM | POA: Diagnosis not present

## 2016-02-12 NOTE — Telephone Encounter (Signed)
Spoke with patient and let her know that we canceled her exercise myoview scheduled for tomorrow due to insurance denial. Let her know that we changed the order to a stress echocardiogram and that once we received approval we would then call to schedule this appointment with her. She verbalized understanding of our conversation, agreement with plan of care, and had no further questions at this time.

## 2016-02-13 ENCOUNTER — Encounter: Admission: RE | Admit: 2016-02-13 | Payer: BC Managed Care – PPO | Source: Ambulatory Visit

## 2016-02-14 NOTE — Telephone Encounter (Signed)
Spoke with patient and confirmed that we have her scheduled for stress echocardiogram coming up March 03, 2016 at 11:30am and reviewed instructions. She verbalized understanding and had no further questions at this time.

## 2016-02-24 ENCOUNTER — Ambulatory Visit (INDEPENDENT_AMBULATORY_CARE_PROVIDER_SITE_OTHER): Payer: BC Managed Care – PPO | Admitting: Neurology

## 2016-02-24 ENCOUNTER — Telehealth: Payer: Self-pay | Admitting: Neurology

## 2016-02-24 DIAGNOSIS — R55 Syncope and collapse: Secondary | ICD-10-CM | POA: Diagnosis not present

## 2016-02-24 MED ORDER — LEVETIRACETAM 500 MG PO TABS: 250.0000 mg | ORAL_TABLET | Freq: Two times a day (BID) | ORAL | 0 refills | Status: DC

## 2016-02-24 NOTE — Procedures (Signed)
    History:  Michele Hess is a 60 year old patient with a history of an event that occurred on 01/12/2016. The patient felt woozy, developed tunnel vision, this lasted for about 2 minutes. The patient then had an event where she began to stiffen and jerk. The patient had an EEG study that apparently was abnormal showing left temporal sharp waves. The patient is being evaluated for seizures.  This is a routine EEG. No skull defects are noted. Medications include fish oil, calcium supplementation, and Keppra.   EEG classification: Normal awake  Description of the recording: The background rhythms of this recording consists of a fairly well modulated medium amplitude alpha rhythm of 8 Hz that is reactive to eye opening and closure. As the record progresses, the patient appears to remain in the waking state throughout the recording. Photic stimulation was performed, resulting in a bilateral and symmetric photic driving response. Hyperventilation was also performed, resulting in a minimal buildup of the background rhythm activities without significant slowing seen. At no time during the recording does there appear to be evidence of spike or spike wave discharges or evidence of focal slowing. EKG monitor shows no evidence of cardiac rhythm abnormalities with a heart rate of 84.  Impression: This is a normal EEG recording in the waking state. No evidence of ictal or interictal discharges are seen.

## 2016-02-24 NOTE — Telephone Encounter (Signed)
I called patient. The patient had an EEG study that is normal. We will taper her off of the Keppra going to 250 mg twice daily for 2 weeks, then stop the medication. We will follow-up in about 4 monts.

## 2016-02-25 NOTE — Telephone Encounter (Signed)
Spoke to pt - follow up scheduled.

## 2016-02-26 ENCOUNTER — Ambulatory Visit: Payer: BC Managed Care – PPO | Admitting: Cardiology

## 2016-03-01 ENCOUNTER — Encounter: Payer: Self-pay | Admitting: Neurology

## 2016-03-01 ENCOUNTER — Encounter: Payer: Self-pay | Admitting: Cardiology

## 2016-03-03 ENCOUNTER — Ambulatory Visit (INDEPENDENT_AMBULATORY_CARE_PROVIDER_SITE_OTHER): Payer: BC Managed Care – PPO

## 2016-03-03 ENCOUNTER — Telehealth: Payer: Self-pay | Admitting: Cardiology

## 2016-03-03 DIAGNOSIS — R55 Syncope and collapse: Secondary | ICD-10-CM

## 2016-03-03 NOTE — Telephone Encounter (Signed)
Let patient know that Dr. Yvone Neu wanted to schedule both nuclear stress test and then a holter monitor as well and that as soon as approved by insurance we will get them scheduled. She did report that she would be out of town next week. She verbalized understanding of our conversation, agreement with plan of care, and had no further questions at this time.

## 2016-03-03 NOTE — Telephone Encounter (Signed)
Patient in to have stress echocardiogram which was inconclusive. During test she had some tachycardia and it was not captured. MD was present at that time. Dr. Yvone Neu wants to schedule a exercise myoview and a holter monitor as well. Will order these tests and call to schedule with patient.

## 2016-03-05 LAB — ECHOCARDIOGRAM STRESS TEST
CSEPEDS: 5 s
CSEPEW: 7.1 METS
CSEPPHR: 214 {beats}/min
Exercise duration (min): 6 min
MPHR: 161 {beats}/min
Percent HR: 132 %
Rest HR: 76 {beats}/min

## 2016-03-06 NOTE — Telephone Encounter (Signed)
Left voicemail message for patient to call back to schedule exercise myoview and holter montior.

## 2016-03-06 NOTE — Telephone Encounter (Signed)
Reviewed myoview instructions, appointment date, time, and location. Scheduled for 03/17/16 at 07:45 am at [facility]. Patient verbalized understanding of all instructions and had no further questions at this time.

## 2016-03-12 ENCOUNTER — Encounter: Payer: Self-pay | Admitting: Neurology

## 2016-03-15 ENCOUNTER — Encounter: Payer: Self-pay | Admitting: Cardiology

## 2016-03-16 ENCOUNTER — Telehealth: Payer: Self-pay | Admitting: Cardiology

## 2016-03-16 NOTE — Telephone Encounter (Signed)
Reviewed treadmill myoview instructions w/pt who verbalized understanding. Confirmed she will not have caffeine 24 hours before test.

## 2016-03-17 ENCOUNTER — Encounter
Admission: RE | Admit: 2016-03-17 | Discharge: 2016-03-17 | Disposition: A | Discharge status: Home / Self Care | Payer: BC Managed Care – PPO | Source: Ambulatory Visit | Attending: Cardiology | Admitting: Cardiology

## 2016-03-17 DIAGNOSIS — R55 Syncope and collapse: Secondary | ICD-10-CM

## 2016-03-17 MED ORDER — TECHNETIUM TC 99M TETROFOSMIN IV KIT
11.8900 | PACK | Freq: Once | INTRAVENOUS | Status: AC | PRN
  Administered 2016-03-17: 11.89 via INTRAVENOUS

## 2016-03-17 MED ORDER — TECHNETIUM TC 99M TETROFOSMIN IV KIT
32.5000 | PACK | Freq: Once | INTRAVENOUS | Status: AC | PRN
  Administered 2016-03-17: 32.5 via INTRAVENOUS

## 2016-03-19 ENCOUNTER — Ambulatory Visit (INDEPENDENT_AMBULATORY_CARE_PROVIDER_SITE_OTHER): Payer: BC Managed Care – PPO

## 2016-03-19 DIAGNOSIS — R55 Syncope and collapse: Secondary | ICD-10-CM | POA: Diagnosis not present

## 2016-03-19 LAB — NM MYOCAR MULTI W/SPECT W/WALL MOTION / EF
CSEPEDS: 0 s
CSEPHR: 91 %
Estimated workload: 10.1 METS
Exercise duration (min): 9 min
LVDIAVOL: 99 mL (ref 46–106)
LVSYSVOL: 25 mL
NUC STRESS TID: 0.82
Peak HR: 148 {beats}/min
Rest HR: 80 {beats}/min
SDS: 0
SRS: 0
SSS: 1

## 2016-03-20 ENCOUNTER — Ambulatory Visit
Admission: RE | Admit: 2016-03-20 | Discharge: 2016-03-20 | Disposition: A | Discharge status: Home / Self Care | Payer: BC Managed Care – PPO | Source: Ambulatory Visit | Attending: Cardiology | Admitting: Cardiology

## 2016-03-20 DIAGNOSIS — R55 Syncope and collapse: Secondary | ICD-10-CM | POA: Insufficient documentation

## 2016-03-24 ENCOUNTER — Encounter: Payer: Self-pay | Admitting: Cardiology

## 2016-03-24 ENCOUNTER — Ambulatory Visit (INDEPENDENT_AMBULATORY_CARE_PROVIDER_SITE_OTHER): Payer: BC Managed Care – PPO | Admitting: Cardiology

## 2016-03-24 VITALS — BP 146/84 | HR 68 | Ht 63.5 in | Wt 127.2 lb

## 2016-03-24 DIAGNOSIS — I951 Orthostatic hypotension: Secondary | ICD-10-CM | POA: Diagnosis not present

## 2016-03-24 DIAGNOSIS — E782 Mixed hyperlipidemia: Secondary | ICD-10-CM | POA: Diagnosis not present

## 2016-03-24 NOTE — Patient Instructions (Signed)
Labwork: Your physician recommends that you return for lab work: CMP, Lipid profile. Do not eat or drink after midnight the night prior to having these labs done. You may take your medication with small sip of water.   Follow-Up: Your physician recommends that you schedule a follow-up appointment as needed. We will call you with results of lab work that is done and if needed schedule a follow up at that time.   It was a pleasure seeing you today here in the office. Please do not hesitate to give [place] a call back if you have any further questions. [place], BSN

## 2016-03-24 NOTE — Progress Notes (Signed)
Cardiology Office Note   Date:  03/24/2016   ID:  [name], DOB 25-Jan-1956, MRN [id number]  Referring Doctor:  Crecencio Mc, MD   Cardiologist:   Wende Bushy, MD   Reason for consultation:  Chief Complaint  Patient presents with  . Other    F/u myoview, stress test and holter. Meds reviewed verbally with pt.      History of Present Illness: Michele Hess is a 60 y.o. female who presents for Follow-up after tests.  Since last visit, she reports having another episode of feeling of closing in or tunnel vision. This was 03/10/2016. She went on a hike in [place] for almost 2 hours. She had been walking and all of a sudden felt these symptoms and had to bring her head down and increase hydration. No chest pain, loss of consciousness.  She has had no shortness of breath, chest pain, palpitations. She is physically active and has been training for half marathons and 5Ks. She has no symptoms with exertion.   ROS:  Please see the history of present illness. Aside from mentioned under HPI, all other systems are reviewed and negative.     Past Medical History:  Diagnosis Date  . GERD (gastroesophageal reflux disease)   . History of ETT    myoview 7/09: 9 min 31 sec, EF 71%, nov evidence for ischemia. ETT 99/10)" 1030", excellent exercise tolerance, no abnormal rhythms, no evidence for ischemia  . HLD (hyperlipidemia)   . Menopausal and postmenopausal disorder   . Syncope and collapse 01/27/2016    Past Surgical History:  Procedure Laterality Date  . ANTERIOR CRUCIATE LIGAMENT REPAIR Left   . echo (other)  9/10   EF 60%, lv normal, RV normal, mild MR, trivial pericardial effusion   . PARTIAL HYSTERECTOMY    . TONSILLECTOMY  1961  . TOTAL ABDOMINAL HYSTERECTOMY  1993   Michele Hess     reports that she quit smoking about 28 years ago. She has never used smokeless tobacco. She reports that she drinks alcohol. She reports that she does not use drugs.   family history  includes Diabetes in her brother and mother; Heart attack (age of onset: 58) in her mother; Heart attack (age of onset: 51) in her father; Lung cancer in her brother.   Outpatient Medications Prior to Visit  Medication Sig Dispense Refill  . ketoconazole (NIZORAL) 2 % shampoo Apply 1 application topically 2 (two) times a week. Use as needed for a rash from sweating.    . Omega-3 Fatty Acids (FISH OIL) 1000 MG CAPS Take by mouth 2 (two) times daily.    Marland Kitchen levETIRAcetam (KEPPRA) 500 MG tablet Take 1 tablet (500 mg total) by mouth 2 (two) times daily. (Patient not taking: Reported on 03/24/2016) 15 tablet 0   No facility-administered medications prior to visit.      Allergies: Neomycin    PHYSICAL EXAM: VS:  BP (!) 146/84 (BP Location: Left Arm, Patient Position: Sitting, Cuff Size: Normal)   Pulse 68   Ht 5' 3.5" (1.613 m)   Wt 127 lb 4 oz (57.7 kg)   BMI 22.19 kg/m  , Body mass index is 22.19 kg/m. Wt Readings from Last 3 Encounters:  03/24/16 127 lb 4 oz (57.7 kg)  02/05/16 126 lb 8 oz (57.4 kg)  01/27/16 125 lb (56.7 kg)    No data found.    GENERAL:  well developed, well nourished, not in acute distress HEENT: normocephalic, pink  conjunctivae, anicteric sclerae, no xanthelasma, normal dentition, oropharynx clear NECK:  no neck vein engorgement, JVP normal, no hepatojugular reflux, carotid upstroke brisk and symmetric, no bruit, no thyromegaly, no lymphadenopathy LUNGS:  good respiratory effort, clear to auscultation bilaterally CV:  PMI not displaced, no thrills, no lifts, S1 and S2 within normal limits, no palpable S3 or S4, no murmurs, no rubs, no gallops ABD:  Soft, nontender, nondistended, normoactive bowel sounds, no abdominal aortic bruit, no hepatomegaly, no splenomegaly MS: nontender back, no kyphosis, no scoliosis, no joint deformities EXT:  2+ DP/PT pulses, no edema, no varicosities, no cyanosis, no clubbing SKIN: warm, nondiaphoretic, normal turgor, no  ulcers NEUROPSYCH: alert, oriented to person, place, and time, sensory/motor grossly intact, normal mood, appropriate affect  Recent Labs: 06/05/2015: ALT 7; BUN 12; Creatinine, Ser 0.57; Potassium 5.0; Sodium 137; TSH 2.19   Lipid Panel    Component Value Date/Time   CHOL 254 (H) 06/05/2015 1030   TRIG 140.0 06/05/2015 1030   HDL 84.90 06/05/2015 1030   CHOLHDL 3 06/05/2015 1030   VLDL 28.0 06/05/2015 1030   LDLCALC 141 (H) 06/05/2015 1030   LDLDIRECT 137.0 06/05/2015 1030     Other studies Reviewed:  EKG:  The ekg from 02/05/2016 was personally reviewed by me and it revealed sinus rhythm, 85 BPM QT 394 ms.  Additional studies/ records that were reviewed personally reviewed by me today include:  Echo 01/12/2016: The left ventricle is normal in size. Left ventricular systolic function is normal. Ejection Fraction = 65-70%. E/E' < 9 is suggestive of normal left ventricular end diastolic pressure The left ventricular wall motion is normal. The right ventricle is normal in size and function. There is trace to mild mitral regurgitation. Trace pulmonic valvular regurgitation. There is mild tricuspid regurgitation. Right ventricular systolic pressure is normal. The left atrial size is normal. Right atrial size is normal. There is no pericardial effusion.  Exercise nuclear stress test 03/19/2016: Exercise myocardial perfusion imaging study with no significant  ischemia Normal wall motion, EF estimated at 72% No EKG changes concerning for ischemia at peak stress or in recovery. Target heart rate achieved, 10.1 METS Low risk scan  Carotid duplex ultrasound 02/12/2016: Normal carotid arteries, bilaterally. Patent vertebral arteries with antegrade flow. Normal subclavian arteries, bilaterally.  Holter 03/19/2016: 24-hour Holter monitor revealed rhythm to be sinus rhythm. Heart rate ranged from 60-132 bpm, average of 83 BPM.  No high grade supraventricular ectopy: 10 isolated  PACs, one atrial couplet.  No evidence of ventricular ectopy.  No evidence of atrial fibrillation. No evidence of AV conduction disease.  ASSESSMENT AND PLAN:  Syncope Patient had seizure-like activity is being followed by neurology.  It is possible that this syncope was brought on by orthostatic hypotension. Her blood pressure did drop 20 points systolic on orthostatic vital signs on intital visit. Her last episode is also consistent with likely orthostatic drop in her blood pressure and vagal symptoms. We talked lengthily about maneuvers she can do if she starts to get symptoms from orthostatic hypotension -lying down immediately, keeping her feet elevated, and moving her legs to help with pumping back blood flow.  We also talked about importance of preventing such episodes from coming on. Patient was advised to make sure she eats enough and drinks enough fluids prior to any planned physical activity.   She has risk factors for CAD including hyperlipidemia and family history of premature CAD. Initially, stress echo was inconclusive. She ended up with an exercise nuclear stress test.  This did not reveal any evidence of ischemia. LVEF was within normal limits on both the stress and echocardiogram. Likelihood of clinically significant CAD was low. Details were discussed with patient at length. Patient reassured.    Hyperlipidemia ASCVD score is 6.8%.  recommend to check CMP and fasting lipid panel. Most of the elevation in her cholesterol is from a genetic component. If her LDL remains above 100 despite her very active lifestyle and heart healthy diet, consider starting low-dose statin therapy with close monitoring. May consider pravastatin. Patient will also be following up with her PCP for this.  Current medicines are reviewed at length with the patient today.  The patient does not have concerns regarding medicines.  Labs/ tests ordered today include:  Orders Placed This Encounter   Procedures  . Comp Met (CMET)  . Lipid Profile    I had a lengthy and detailed discussion with the patient regarding diagnoses, prognosis, diagnostic options, treatment options .   I counseled the patient on importance of lifestyle modification including heart healthy diet, regular physical activity.   Disposition:   FU with undersigned prn     Signed, Wende Bushy, MD  03/24/2016 11:30 AM    [place]        Cardiology Office Note   Date:  03/24/2016   ID:  MACKENZEY CROWNOVER, DOB [birth year], MRN [id number]  Referring Doctor:  Crecencio Mc, MD   Cardiologist:   Wende Bushy, MD   Reason for consultation:  Chief Complaint  Patient presents with  . Other    F/u myoview, stress test and holter. Meds reviewed verbally with pt.      History of Present Illness: Michele Hess is a 60 y.o. female who presents for Evaluation of syncope. 01/12/2016, patient got of a cruise ship and started feeling dizzy, fish and turning dark/tunnel vision. Movement made her symptoms worse and so she decided to call for her husband. Another person was trying to help her as she looked like she was in distress. Before she could go to the ground, she was noted to have jerking movements of her extremities. According to her husband, she had her eyes open the whole time. There was loss of consciousness for likely a few minutes. The last thing she remembered was calling for her husband. When she came to, the paramedics were assessing her. She was then sent to [facility] where she stayed for a few days. Several testing were done including EEG, CT of the head, and echocardiogram. She was then started on Pravachol due to abnormal EEG.  Prior to that, roughly 6 months ago, she had a similar but less intense episode while she was at the grocery store. She was walking and started feeling dizzy or lightheaded, vision turning dark. She did not want to ask for any help  and so she focused to get to her car right away. There was no loss of consciousness.  She has had no shortness of breath, chest pain, palpitations. She is physically active and has been training for half marathons and 5Ks. She has no symptoms with exertion.   ROS:  Please see the history of present illness. Aside from mentioned under HPI, all other systems are reviewed and negative.     Past Medical History:  Diagnosis Date  . GERD (gastroesophageal reflux disease)   . History of ETT    myoview 7/09: 9 min 31 sec, EF 71%, nov evidence for ischemia. ETT  99/10)" 1030", excellent exercise tolerance, no abnormal rhythms, no evidence for ischemia  . HLD (hyperlipidemia)   . Menopausal and postmenopausal disorder   . Syncope and collapse 01/27/2016    Past Surgical History:  Procedure Laterality Date  . ANTERIOR CRUCIATE LIGAMENT REPAIR Left   . echo (other)  9/10   EF 60%, lv normal, RV normal, mild MR, trivial pericardial effusion   . PARTIAL HYSTERECTOMY    . TONSILLECTOMY  1961  . TOTAL ABDOMINAL HYSTERECTOMY  1993   Michele Hess     reports that she quit smoking about 28 years ago. She has never used smokeless tobacco. She reports that she drinks alcohol. She reports that she does not use drugs.   family history includes Diabetes in her brother and mother; Heart attack (age of onset: 52) in her mother; Heart attack (age of onset: 11) in her father; Lung cancer in her brother.   Outpatient Medications Prior to Visit  Medication Sig Dispense Refill  . ketoconazole (NIZORAL) 2 % shampoo Apply 1 application topically 2 (two) times a week. Use as needed for a rash from sweating.    . Omega-3 Fatty Acids (FISH OIL) 1000 MG CAPS Take by mouth 2 (two) times daily.    Marland Kitchen levETIRAcetam (KEPPRA) 500 MG tablet Take 1 tablet (500 mg total) by mouth 2 (two) times daily. (Patient not taking: Reported on 03/24/2016) 15 tablet 0   No facility-administered medications prior to visit.       Allergies: Neomycin    PHYSICAL EXAM: VS:  BP (!) 146/84 (BP Location: Left Arm, Patient Position: Sitting, Cuff Size: Normal)   Pulse 68   Ht 5' 3.5" (1.613 m)   Wt 127 lb 4 oz (57.7 kg)   BMI 22.19 kg/m  , Body mass index is 22.19 kg/m. Wt Readings from Last 3 Encounters:  03/24/16 127 lb 4 oz (57.7 kg)  02/05/16 126 lb 8 oz (57.4 kg)  01/27/16 125 lb (56.7 kg)    No data found.    GENERAL:  well developed, well nourished, not in acute distress HEENT: normocephalic, pink conjunctivae, anicteric sclerae, no xanthelasma, normal dentition, oropharynx clear NECK:  no neck vein engorgement, JVP normal, no hepatojugular reflux, carotid upstroke brisk and symmetric, no bruit, no thyromegaly, no lymphadenopathy LUNGS:  good respiratory effort, clear to auscultation bilaterally CV:  PMI not displaced, no thrills, no lifts, S1 and S2 within normal limits, no palpable S3 or S4, no murmurs, no rubs, no gallops ABD:  Soft, nontender, nondistended, normoactive bowel sounds, no abdominal aortic bruit, no hepatomegaly, no splenomegaly MS: nontender back, no kyphosis, no scoliosis, no joint deformities EXT:  2+ DP/PT pulses, no edema, no varicosities, no cyanosis, no clubbing SKIN: warm, nondiaphoretic, normal turgor, no ulcers NEUROPSYCH: alert, oriented to person, place, and time, sensory/motor grossly intact, normal mood, appropriate affect  Recent Labs: 06/05/2015: ALT 7; BUN 12; Creatinine, Ser 0.57; Potassium 5.0; Sodium 137; TSH 2.19   Lipid Panel    Component Value Date/Time   CHOL 254 (H) 06/05/2015 1030   TRIG 140.0 06/05/2015 1030   HDL 84.90 06/05/2015 1030   CHOLHDL 3 06/05/2015 1030   VLDL 28.0 06/05/2015 1030   LDLCALC 141 (H) 06/05/2015 1030   LDLDIRECT 137.0 06/05/2015 1030     Other studies Reviewed:  EKG:  The ekg from 02/05/2016 was personally reviewed by me and it revealed sinus rhythm, 85 BPM QT 394 ms.  Additional studies/ records that were reviewed  personally reviewed by me  today include:  Echo 01/12/2016: The left ventricle is normal in size. Left ventricular systolic function is normal. Ejection Fraction = 65-70%. E/E' < 9 is suggestive of normal left ventricular end diastolic pressure The left ventricular wall motion is normal. The right ventricle is normal in size and function. There is trace to mild mitral regurgitation. Trace pulmonic valvular regurgitation. There is mild tricuspid regurgitation. Right ventricular systolic pressure is normal. The left atrial size is normal. Right atrial size is normal. There is no pericardial effusion.    ASSESSMENT AND PLAN:  Syncope Patient had seizure-like activity is being followed by neurology. Per patient, the plan was to repeat an EEG while on Keppra. Neurology believes that she does not have epilepsy. It is possible that this syncope was brought on by orthostatic hypotension. Her blood pressure did drop 20 points systolic on orthostatic vital signs. She has risk factors for CAD including hyperlipidemia and family history of premature CAD. To complete workup, recommend exercise nuclear stress test, carotid ultrasound.  Hyperlipidemia ASCVD score is 6.8%. We will re-evaluate after stress testing has been done.  Current medicines are reviewed at length with the patient today.  The patient does not have concerns regarding medicines.  Labs/ tests ordered today include:  Orders Placed This Encounter  Procedures  . Comp Met (CMET)  . Lipid Profile    I had a lengthy and detailed discussion with the patient regarding diagnoses, prognosis, diagnostic options, treatment options .   I counseled the patient on importance of lifestyle modification including heart healthy diet, regular physical activity.   Disposition:   FU with undersigned after tests   I spent at least 60 minutes with the patient today and more than 50% of the time was spent counseling the patient and coordinating  care.   Signed, Wende Bushy, MD  03/24/2016 11:30 AM    [facility]

## 2016-04-01 ENCOUNTER — Other Ambulatory Visit (INDEPENDENT_AMBULATORY_CARE_PROVIDER_SITE_OTHER): Payer: BC Managed Care – PPO

## 2016-04-01 DIAGNOSIS — E782 Mixed hyperlipidemia: Secondary | ICD-10-CM

## 2016-04-02 ENCOUNTER — Other Ambulatory Visit: Payer: Self-pay | Admitting: Internal Medicine

## 2016-04-02 ENCOUNTER — Telehealth: Payer: Self-pay | Admitting: *Deleted

## 2016-04-02 DIAGNOSIS — E785 Hyperlipidemia, unspecified: Secondary | ICD-10-CM

## 2016-04-02 DIAGNOSIS — E875 Hyperkalemia: Secondary | ICD-10-CM

## 2016-04-02 LAB — COMPREHENSIVE METABOLIC PANEL
ALBUMIN: 4.7 g/dL (ref 3.5–5.5)
ALT: 11 IU/L (ref 0–32)
AST: 21 IU/L (ref 0–40)
Albumin/Globulin Ratio: 2 (ref 1.2–2.2)
Alkaline Phosphatase: 58 IU/L (ref 39–117)
BILIRUBIN TOTAL: 0.3 mg/dL (ref 0.0–1.2)
BUN / CREAT RATIO: 20 (ref 9–23)
BUN: 11 mg/dL (ref 6–24)
CALCIUM: 10.2 mg/dL (ref 8.7–10.2)
CHLORIDE: 104 mmol/L (ref 96–106)
CO2: 26 mmol/L (ref 18–29)
CREATININE: 0.56 mg/dL — AB (ref 0.57–1.00)
GFR, EST AFRICAN AMERICAN: 118 mL/min/{1.73_m2} (ref >59)
GFR, EST NON AFRICAN AMERICAN: 102 mL/min/{1.73_m2} (ref >59)
GLUCOSE: 109 mg/dL — AB (ref 65–99)
Globulin, Total: 2.4 g/dL (ref 1.5–4.5)
Potassium: 5.9 mmol/L — ABNORMAL HIGH (ref 3.5–5.2)
Sodium: 147 mmol/L — ABNORMAL HIGH (ref 134–144)
TOTAL PROTEIN: 7.1 g/dL (ref 6.0–8.5)

## 2016-04-02 LAB — LIPID PANEL
CHOL/HDL RATIO: 3.1 ratio (ref 0.0–4.4)
Cholesterol, Total: 226 mg/dL — ABNORMAL HIGH (ref 100–199)
HDL: 72 mg/dL (ref >39)
LDL CALC: 133 mg/dL — AB (ref 0–99)
Triglycerides: 105 mg/dL (ref 0–149)
VLDL CHOLESTEROL CAL: 21 mg/dL (ref 5–40)

## 2016-04-02 MED ORDER — PRAVASTATIN SODIUM 20 MG PO TABS
20.0000 mg | ORAL_TABLET | Freq: Every evening | ORAL | 6 refills | Status: DC
Start: 2016-04-02 — End: 2016-11-01

## 2016-04-02 NOTE — Progress Notes (Signed)
Patient needs a lab appt at her convinence, orders placed, please call and schedule, thanks

## 2016-04-02 NOTE — Telephone Encounter (Signed)
-----   Message from Wende Bushy, MD sent at 04/02/2016 11:05 AM EDT ----- K is elevated --- pls forward to PCP lfts ok, renal function ok As discussed in last visit: LDL still > 100 despite active lifestyle. She has family hx of premature CAD.  Rec to consider low dose statin -- pravastatin 20mg  po qhs. We can forward this to PCP as well. Pt may like to discuss this with PCP as well.

## 2016-04-02 NOTE — Progress Notes (Signed)
basic 

## 2016-04-02 NOTE — Telephone Encounter (Signed)
Reviewed lab results along with recommendations by Dr. Yvone Neu. She verbalized understanding, agreement with plan, and had no further questions.

## 2016-04-03 NOTE — Progress Notes (Signed)
Ok. Thank you so much. Pt was scheduled.

## 2016-04-03 NOTE — Progress Notes (Signed)
Pt wanted to know if the lab ordered is different than the one she had with Dr Yvone Neu (Cardiologist)?

## 2016-04-03 NOTE — Progress Notes (Signed)
No it is one that was not done, that is why. thanks

## 2016-04-07 ENCOUNTER — Other Ambulatory Visit (INDEPENDENT_AMBULATORY_CARE_PROVIDER_SITE_OTHER): Payer: BC Managed Care – PPO

## 2016-04-07 DIAGNOSIS — E875 Hyperkalemia: Secondary | ICD-10-CM | POA: Diagnosis not present

## 2016-04-07 LAB — BASIC METABOLIC PANEL
BUN: 14 mg/dL (ref 6–23)
CHLORIDE: 100 meq/L (ref 96–112)
CO2: 28 meq/L (ref 19–32)
CREATININE: 0.65 mg/dL (ref 0.40–1.20)
Calcium: 9.3 mg/dL (ref 8.4–10.5)
GFR: 99.06 mL/min (ref >60.00)
GLUCOSE: 87 mg/dL (ref 70–99)
Potassium: 4.5 mEq/L (ref 3.5–5.1)
Sodium: 134 mEq/L — ABNORMAL LOW (ref 135–145)

## 2016-04-08 ENCOUNTER — Telehealth: Payer: Self-pay | Admitting: Cardiology

## 2016-04-08 ENCOUNTER — Encounter: Payer: Self-pay | Admitting: Internal Medicine

## 2016-04-08 NOTE — Telephone Encounter (Signed)
Pt calling has some question on the new medication Pravastatin we are putting on  1. Wants to reduce it to 10 mg 2. Wants to be on the right track. 3. Wants to know her number/risk for heart attack or stroke For she is being put on this medication would like to know 4.Is she allowed to take her allegra  5. Also would like to know why we chose this medication over any other kind  Please call back .

## 2016-04-08 NOTE — Telephone Encounter (Signed)
After discussion with Dr. Yvone Neu regarding these questions I reviewed with patient that pravastatin is one of the lower risk medications used and this one was chosen by Dr. Yvone Neu based on her recent lab work that was done. Also reviewed her ASCVD risk was 6.8% and this was what also was used to determine dosage amount. She then wanted to know about taking allegra and let her know that she should check with the pharmacist to see if there are any drug interactions that she should be concerned about. She was very grateful that I reviewed everything with her and had no further questions at this time. Let her know to call back if she should have any further questions or problems.

## 2016-06-08 ENCOUNTER — Ambulatory Visit: Payer: Self-pay | Admitting: Neurology

## 2016-06-09 ENCOUNTER — Ambulatory Visit (INDEPENDENT_AMBULATORY_CARE_PROVIDER_SITE_OTHER): Payer: BC Managed Care – PPO | Admitting: Neurology

## 2016-06-09 ENCOUNTER — Encounter: Payer: Self-pay | Admitting: Neurology

## 2016-06-09 VITALS — BP 132/80 | HR 60 | Ht 63.5 in | Wt 131.0 lb

## 2016-06-09 DIAGNOSIS — R55 Syncope and collapse: Secondary | ICD-10-CM

## 2016-06-09 NOTE — Progress Notes (Signed)
Reason for visit: Syncope  Michele Hess is an 60 y.o. female  History of present illness:  Michele Hess is a 60 year old right-handed white female with a history of episodes of syncope, at least some events have been associated with syncopal seizures. The patient has had an EEG study and MRI brain evaluation that was unremarkable. She was taken off of her Keppra. The patient has had an occasional event of dizziness, if she gets her head down and drinks fluid, she does not black out. The patient has not had any events since July 2017. She is back to driving a car, she is doing well. She denies any further events.  Past Medical History:  Diagnosis Date  . GERD (gastroesophageal reflux disease)   . History of ETT    myoview 7/09: 9 min 31 sec, EF 71%, nov evidence for ischemia. ETT 99/10)" 1030", excellent exercise tolerance, no abnormal rhythms, no evidence for ischemia  . HLD (hyperlipidemia)   . Menopausal and postmenopausal disorder   . Syncope and collapse 01/27/2016    Past Surgical History:  Procedure Laterality Date  . ANTERIOR CRUCIATE LIGAMENT REPAIR Left   . echo (other)  9/10   EF 60%, lv normal, RV normal, mild MR, trivial pericardial effusion   . PARTIAL HYSTERECTOMY    . TONSILLECTOMY  1961  . TOTAL ABDOMINAL HYSTERECTOMY  1993   Michele Hess    Family History  Problem Relation Age of Onset  . Heart attack Mother 21  . Diabetes Mother   . Heart attack Father 62  . Lung cancer Brother   . Diabetes Brother   . Colon cancer Neg Hx   . Stomach cancer Neg Hx     Social history:  reports that she quit smoking about 29 years ago. She has never used smokeless tobacco. She reports that she drinks alcohol. She reports that she does not use drugs.    Allergies  Allergen Reactions  . Neomycin     Medications:  Prior to Admission medications   Medication Sig Start Date End Date Taking? Authorizing Provider  calcium carbonate (CALCIUM 600) 600 MG TABS tablet Take  1,200 mg by mouth daily with breakfast.   Yes Historical Provider, MD  Multiple Vitamin (MULTIVITAMIN) tablet Take 1 tablet by mouth daily.   Yes Historical Provider, MD  Omega-3 Fatty Acids (FISH OIL) 1000 MG CAPS Take by mouth 2 (two) times daily.   Yes Historical Provider, MD  pravastatin (PRAVACHOL) 20 MG tablet Take 1 tablet (20 mg total) by mouth every evening. 04/02/16 07/01/16 Yes Wende Bushy, MD    ROS:  Out of a complete 14 system review of symptoms, the patient complains only of the following symptoms, and all other reviewed systems are negative.  Syncope  Blood pressure 132/80, pulse 60, height 5' 3.5" (1.613 m), weight 131 lb (59.4 kg).  Physical Exam  General: The patient is alert and cooperative at the time of the examination.  Skin: No significant peripheral edema is noted.   Neurologic Exam  Mental status: The patient is alert and oriented x 3 at the time of the examination. The patient has apparent normal recent and remote memory, with an apparently normal attention span and concentration ability.   Cranial nerves: Facial symmetry is present. Speech is normal, no aphasia or dysarthria is noted. Extraocular movements are full. Visual fields are full.  Motor: The patient has good strength in all 4 extremities.  Sensory examination: Soft touch sensation is symmetric  on the face, arms, and legs.  Coordination: The patient has good finger-nose-finger and heel-to-shin bilaterally.  Gait and station: The patient has a normal gait. Tandem gait is normal. Romberg is negative. No drift is seen.  Reflexes: Deep tendon reflexes are symmetric.   Assessment/Plan:  1. History of syncope  The patient will continue to monitor for any symptoms of impending syncope. She will lie down if she starts feeling dizzy. The patient will follow-up through this office on an as-needed basis. She will contact me if needed.  Jill Alexanders MD 06/09/2016 11:09 AM  [facility] [address] [place],  [address]  Phone [phone] Fax [phone]

## 2016-06-22 ENCOUNTER — Encounter: Payer: Self-pay | Admitting: Gastroenterology

## 2016-07-07 ENCOUNTER — Encounter: Payer: Self-pay | Admitting: Internal Medicine

## 2016-07-14 LAB — HM MAMMOGRAPHY

## 2016-07-15 ENCOUNTER — Encounter: Payer: Self-pay | Admitting: Internal Medicine

## 2016-08-06 ENCOUNTER — Ambulatory Visit (INDEPENDENT_AMBULATORY_CARE_PROVIDER_SITE_OTHER): Payer: BC Managed Care – PPO | Admitting: Internal Medicine

## 2016-08-06 ENCOUNTER — Encounter: Payer: Self-pay | Admitting: Internal Medicine

## 2016-08-06 VITALS — BP 120/68 | HR 66 | Temp 97.7°F | Resp 16 | Ht 62.75 in | Wt 131.1 lb

## 2016-08-06 DIAGNOSIS — Z79899 Other long term (current) drug therapy: Secondary | ICD-10-CM

## 2016-08-06 DIAGNOSIS — E785 Hyperlipidemia, unspecified: Secondary | ICD-10-CM

## 2016-08-06 DIAGNOSIS — K219 Gastro-esophageal reflux disease without esophagitis: Secondary | ICD-10-CM

## 2016-08-06 DIAGNOSIS — Z0000 Encounter for general adult medical examination without abnormal findings: Secondary | ICD-10-CM

## 2016-08-06 DIAGNOSIS — Z87898 Personal history of other specified conditions: Secondary | ICD-10-CM

## 2016-08-06 DIAGNOSIS — R0789 Other chest pain: Secondary | ICD-10-CM

## 2016-08-06 DIAGNOSIS — E559 Vitamin D deficiency, unspecified: Secondary | ICD-10-CM

## 2016-08-06 DIAGNOSIS — R0981 Nasal congestion: Secondary | ICD-10-CM

## 2016-08-06 LAB — LIPID PANEL
CHOLESTEROL: 217 mg/dL — AB (ref 0–200)
HDL: 89.7 mg/dL (ref >39.00)
LDL CALC: 110 mg/dL — AB (ref 0–99)
NonHDL: 127.79
Total CHOL/HDL Ratio: 2
Triglycerides: 87 mg/dL (ref 0.0–149.0)
VLDL: 17.4 mg/dL (ref 0.0–40.0)

## 2016-08-06 LAB — COMPREHENSIVE METABOLIC PANEL
ALT: 9 U/L (ref 0–35)
AST: 22 U/L (ref 0–37)
Albumin: 5 g/dL (ref 3.5–5.2)
Alkaline Phosphatase: 57 U/L (ref 39–117)
BUN: 10 mg/dL (ref 6–23)
CHLORIDE: 102 meq/L (ref 96–112)
CO2: 32 mEq/L (ref 19–32)
CREATININE: 0.61 mg/dL (ref 0.40–1.20)
Calcium: 10.1 mg/dL (ref 8.4–10.5)
GFR: 106.48 mL/min (ref >60.00)
GLUCOSE: 110 mg/dL — AB (ref 70–99)
POTASSIUM: 5.1 meq/L (ref 3.5–5.1)
SODIUM: 140 meq/L (ref 135–145)
TOTAL PROTEIN: 7.8 g/dL (ref 6.0–8.3)
Total Bilirubin: 0.5 mg/dL (ref 0.2–1.2)

## 2016-08-06 LAB — TSH: TSH: 1.41 u[IU]/mL (ref 0.35–4.50)

## 2016-08-06 LAB — VITAMIN D 25 HYDROXY (VIT D DEFICIENCY, FRACTURES): VITD: 39.9 ng/mL (ref 30.00–100.00)

## 2016-08-06 NOTE — Patient Instructions (Addendum)
[email or web address] has athletic socks with mild compression  To help support blood pressure during runs  Hydrate with  electrolyte containing drinks!   You should try [employer]'s Sinus rinse ;  It is a stong sinus "flush" using water and medicated salts.  Do it over the sink because it can be a bit messy.  Use vaseline to moisturize the inside of your noise during the  Winter season   The ShingRx vaccine will be available in about 6 months and IS ADVISED for all interested adults over 50 to prevent shingles

## 2016-08-06 NOTE — Progress Notes (Signed)
Pre-visit discussion using our clinic review tool. No additional management support is needed unless otherwise documented below in the visit note.  

## 2016-08-06 NOTE — Progress Notes (Signed)
Patient ID: Michele Hess, female    DOB: 06/29/1956  Age: 61 y.o. MRN: [id number]  The patient is here for annual wellness examination and surveillance /management of other chronic  problems.   Up to date on PAP , colonoscopy and mammogram  Had flu vaccine    Had MMR vaccine two doses Feb/march despite allergy to neomycin       .   The risk factors are reflected in the social history.  The roster of all physicians providing medical care to patient - is listed in the Snapshot section of the chart.  Home safety : The patient has smoke detectors in the home. They wear seatbelts.  There are no firearms at home. There is no violence in the home.   There is no risks for hepatitis, STDs or HIV. There is no   history of blood transfusion. They have no travel history to infectious disease endemic areas of the world.  The patient has seen their dentist in the last six month. They have seen their eye doctor in the last year.    Discussed the need for sun protection: hats, long sleeves and use of sunscreen if there is significant sun exposure.   Diet: the importance of a healthy diet is discussed. They do have a healthy diet.  The benefits of regular aerobic exercise were discussed. She runs 5 days per week , training for hal marathon, averages about 5 to 10 miles per run  .   Depression screen: there are no signs or vegative symptoms of depression- irritability, change in appetite, anhedonia, sadness/tearfullness.  The following portions of the patient's history were reviewed and updated as appropriate: allergies, current medications, past family history, past medical history,  past surgical history, past social history  and problem list.  Visual acuity was not assessed per patient preference since she has regular follow up with her ophthalmologist. Hearing and body mass index were assessed and reviewed.   During the course of the visit the patient was educated and counseled about  appropriate screening and preventive services including : fall prevention , diabetes screening, nutrition counseling, colorectal cancer screening, and recommended immunizations.    CC: The primary encounter diagnosis was Hyperlipidemia with target LDL less than 100. Diagnoses of Long-term use of high-risk medication, Vitamin D deficiency, Encounter for preventive health examination, Nasal sinus congestion, Gastroesophageal reflux disease without esophagitis, History of syncope, and Chest pain, atypical were also pertinent to this visit. Had an episode of heartburn a month ago caused by too much vinagrette. Has had ccasional episodes of intense anxiety that occut while driving,  Episodes started after her syncopal episode.  None recently,  And has not had any more syncope since she has been hydrating using electrolyte replacement drinks and avoiding ecessively long workouts   Tried pravastatin 20 mg daily  in augustm recommended during workup for syncope,  for LDL > 100.  Feels fine, no muscle aches.  Some congestion and full nose in the morning,  Occasionally blood streaks in the discharge, no pain or fevers. No purulent dischargee   History Desiree has a past medical history of GERD (gastroesophageal reflux disease); History of ETT; HLD (hyperlipidemia); Menopausal and postmenopausal disorder; Seizure (HCC) (01/2070); and Syncope and collapse (01/27/2016).   She has a past surgical history that includes Partial hysterectomy; echo (other) (9/10); Total abdominal hysterectomy (1993); Anterior cruciate ligament repair (Left, 2011); and Tonsillectomy (1961).   Her family history includes Diabetes in her brother and mother; Heart attack (  age of onset: 4) in her mother; Heart attack (age of onset: 43) in her father; Lung cancer in her brother.She reports that she quit smoking about 29 years ago. Her smoking use included Cigarettes. She has a 17.00 pack-year smoking history. She has never used smokeless  tobacco. She reports that she drinks about 1.8 oz of alcohol per week . She reports that she does not use drugs.  Outpatient Medications Prior to Visit  Medication Sig Dispense Refill  . calcium carbonate (CALCIUM 600) 600 MG TABS tablet Take 1,200 mg by mouth daily with breakfast.    . Multiple Vitamin (MULTIVITAMIN) tablet Take 1 tablet by mouth daily.    . Omega-3 Fatty Acids (FISH OIL) 1000 MG CAPS Take by mouth 2 (two) times daily.    . pravastatin (PRAVACHOL) 20 MG tablet Take 1 tablet (20 mg total) by mouth every evening. 30 tablet 6   No facility-administered medications prior to visit.     Review of Systems   Patient denies headache, fevers, malaise, unintentional weight loss, skin rash, eye pain,  sinus pain, sore throat, dysphagia,  hemoptysis , cough, dyspnea, wheezing, chest pain, palpitations, orthopnea, edema, abdominal pain, nausea, melena, diarrhea, constipation, flank pain, dysuria, hematuria, urinary  Frequency, nocturia, numbness, tingling, seizures,  Focal weakness, Loss of consciousness,  Tremor, insomnia, depression, and suicidal ideation.      Objective:  BP 120/68   Pulse 66   Temp 97.7 F (36.5 C) (Oral)   Resp 16   Ht 5' 2.75" (1.594 m) Comment: measured without shoes  Wt 131 lb 2 oz (59.5 kg)   SpO2 99%   BMI 23.41 kg/m   Physical Exam   General appearance: alert, cooperative and appears stated age Ears: normal TM's and external ear canals both ears Throat: lips, mucosa, and tongue normal; teeth and gums normal Neck: no adenopathy, no carotid bruit, supple, symmetrical, trachea midline and thyroid not enlarged, symmetric, no tenderness/mass/nodules Back: symmetric, no curvature. ROM normal. No CVA tenderness. Lungs: clear to auscultation bilaterally Heart: regular rate and rhythm, S1, S2 normal, no murmur, click, rub or gallop Abdomen: soft, non-tender; bowel sounds normal; no masses,  no organomegaly Pulses: 2+ and symmetric Skin: Skin color,  texture, turgor normal. No rashes or lesions Lymph nodes: Cervical, supraclavicular, and axillary nodes normal. Neuro: CNs 2-12 intact. DTRs 2+/4 in biceps, brachioradialis, patellars and achilles. Muscle strength 5/5 in upper and lower exremities. Fine resting tremor bilaterally both hands cerebellar function normal. Romberg negative.  No pronator drift.   Gait normal.     Assessment & Plan:   Problem List Items Addressed This Visit    Chest pain, atypical    attributed to GERD.  Noninvasive cardiac workup included a normal stress echo August 2017      Encounter for preventive health examination    Annual comprehensive preventive exam was done as well as an evaluation and management of chronic conditions .  During the course of the visit the patient was educated and counseled about appropriate screening and preventive services including :  diabetes screening, lipid analysis with projected  10 year  risk for CAD , nutrition counseling, breast, cervical and colorectal cancer screening, and recommended immunizations.  Printed recommendations for health maintenance screenings was given      GERD (gastroesophageal reflux disease)    Had EGD to rule out Barrett's.  Using PPI alternating with H2 blockers for intermittent symptoms.       History of syncope    Last episode  occurred in July 2017 with seizure activity observed, Neurologic workup by Dr Jannifer Franklin was negative for seizure focus and Keppra was temporarily prescribed. Etiology believed to be orthostasis which is now avoided during runs/workouts with aggressive hydration.  recommended trial of compression sock use      Hyperlipidemia with target LDL less than 100 - Primary    She is tolerating pravastatin started in august for LDL > 100.  LFTs are normal. Given her HDL of 90, No changes today   Lab Results  Component Value Date   CHOL 217 (H) 08/06/2016   HDL 89.70 08/06/2016   LDLCALC 110 (H) 08/06/2016   LDLDIRECT 137.0 06/05/2015    TRIG 87.0 08/06/2016   CHOLHDL 2 08/06/2016   Lab Results  Component Value Date   ALT 9 08/06/2016   AST 22 08/06/2016   ALKPHOS 57 08/06/2016   BILITOT 0.5 08/06/2016         Relevant Orders   Lipid panel (Completed)   TSH (Completed)   Nasal sinus congestion    Saline nasal rinses recommended  And moisturization of nasal passages with vaseline.  No signs of infection on exam or by history        Other Visit Diagnoses    Long-term use of high-risk medication       Relevant Orders   Comprehensive metabolic panel (Completed)   Vitamin D deficiency       Relevant Orders   VITAMIN D 25 Hydroxy (Vit-D Deficiency, Fractures) (Completed)      I am having Ms. Yves Dill maintain her Fish Oil, calcium carbonate, pravastatin, and multivitamin.  No orders of the defined types were placed in this encounter.   There are no discontinued medications.  Follow-up: No Follow-up on file.   Crecencio Mc, MD

## 2016-08-09 ENCOUNTER — Encounter: Payer: Self-pay | Admitting: Internal Medicine

## 2016-08-09 DIAGNOSIS — Z87898 Personal history of other specified conditions: Secondary | ICD-10-CM | POA: Insufficient documentation

## 2016-08-09 DIAGNOSIS — R0981 Nasal congestion: Secondary | ICD-10-CM | POA: Insufficient documentation

## 2016-08-09 NOTE — Assessment & Plan Note (Signed)
She is tolerating pravastatin started in august for LDL > 100.  LFTs are normal. Given her HDL of 90, No changes today   Lab Results  Component Value Date   CHOL 217 (H) 08/06/2016   HDL 89.70 08/06/2016   LDLCALC 110 (H) 08/06/2016   LDLDIRECT 137.0 06/05/2015   TRIG 87.0 08/06/2016   CHOLHDL 2 08/06/2016   Lab Results  Component Value Date   ALT 9 08/06/2016   AST 22 08/06/2016   ALKPHOS 57 08/06/2016   BILITOT 0.5 08/06/2016

## 2016-08-09 NOTE — Assessment & Plan Note (Signed)
attributed to GERD.  Noninvasive cardiac workup included a normal stress echo August 2017

## 2016-08-09 NOTE — Assessment & Plan Note (Signed)
Annual comprehensive preventive exam was done as well as an evaluation and management of chronic conditions .  During the course of the visit the patient was educated and counseled about appropriate screening and preventive services including :  diabetes screening, lipid analysis with projected  10 year  risk for CAD , nutrition counseling, breast, cervical and colorectal cancer screening, and recommended immunizations.  Printed recommendations for health maintenance screenings was given 

## 2016-08-09 NOTE — Assessment & Plan Note (Signed)
Saline nasal rinses recommended  And moisturization of nasal passages with vaseline.  No signs of infection on exam or by history

## 2016-08-09 NOTE — Assessment & Plan Note (Signed)
Last episode occurred in July 2017 with seizure activity observed, Neurologic workup by Dr Jannifer Franklin was negative for seizure focus and Keppra was temporarily prescribed. Etiology believed to be orthostasis which is now avoided during runs/workouts with aggressive hydration.  recommended trial of compression sock use

## 2016-08-09 NOTE — Assessment & Plan Note (Addendum)
Had EGD to rule out Barrett's.  Using PPI alternating with H2 blockers for intermittent symptoms.

## 2016-08-14 ENCOUNTER — Encounter: Payer: Self-pay | Admitting: Internal Medicine

## 2016-08-16 ENCOUNTER — Other Ambulatory Visit: Payer: Self-pay | Admitting: Internal Medicine

## 2016-08-16 MED ORDER — AMOXICILLIN-POT CLAVULANATE 875-125 MG PO TABS
1.0000 | ORAL_TABLET | Freq: Two times a day (BID) | ORAL | 0 refills | Status: DC
Start: 2016-08-16 — End: 2017-03-24

## 2016-11-01 ENCOUNTER — Other Ambulatory Visit: Payer: Self-pay | Admitting: Cardiology

## 2016-11-01 DIAGNOSIS — E785 Hyperlipidemia, unspecified: Secondary | ICD-10-CM

## 2016-11-05 ENCOUNTER — Ambulatory Visit: Payer: BC Managed Care – PPO | Admitting: Internal Medicine

## 2016-12-30 ENCOUNTER — Other Ambulatory Visit: Payer: Self-pay | Admitting: Gastroenterology

## 2017-01-18 ENCOUNTER — Telehealth: Payer: Self-pay | Admitting: *Deleted

## 2017-01-18 DIAGNOSIS — Z79899 Other long term (current) drug therapy: Secondary | ICD-10-CM

## 2017-01-18 DIAGNOSIS — E785 Hyperlipidemia, unspecified: Secondary | ICD-10-CM

## 2017-01-18 NOTE — Telephone Encounter (Signed)
Patient requested to know if she would need labs prior to her visit on 02/10/17

## 2017-01-18 NOTE — Telephone Encounter (Signed)
Ordered CMP and lipid panel. Scheduled pt a lab appt. Is there anything else that needs to be ordered?

## 2017-01-19 NOTE — Telephone Encounter (Signed)
No, that's great,  Thanks!

## 2017-01-29 ENCOUNTER — Other Ambulatory Visit: Payer: Self-pay | Admitting: Gastroenterology

## 2017-01-29 ENCOUNTER — Ambulatory Visit: Payer: BC Managed Care – PPO | Admitting: Internal Medicine

## 2017-01-29 NOTE — Telephone Encounter (Signed)
Yes that's fine. thanks

## 2017-01-29 NOTE — Telephone Encounter (Signed)
It has been over 1 year since pt was last seen in the office. She is s/p Egd in May of lst year. Just want to make sure you are ok to refill her PPI.

## 2017-02-08 ENCOUNTER — Ambulatory Visit (INDEPENDENT_AMBULATORY_CARE_PROVIDER_SITE_OTHER): Payer: BC Managed Care – PPO | Admitting: Internal Medicine

## 2017-02-08 DIAGNOSIS — E785 Hyperlipidemia, unspecified: Secondary | ICD-10-CM

## 2017-02-08 DIAGNOSIS — Z79899 Other long term (current) drug therapy: Secondary | ICD-10-CM

## 2017-02-08 LAB — COMPREHENSIVE METABOLIC PANEL
ALBUMIN: 4.6 g/dL (ref 3.5–5.2)
ALT: 12 U/L (ref 0–35)
AST: 27 U/L (ref 0–37)
Alkaline Phosphatase: 51 U/L (ref 39–117)
BILIRUBIN TOTAL: 0.6 mg/dL (ref 0.2–1.2)
BUN: 11 mg/dL (ref 6–23)
CALCIUM: 9.8 mg/dL (ref 8.4–10.5)
CO2: 29 mEq/L (ref 19–32)
CREATININE: 0.66 mg/dL (ref 0.40–1.20)
Chloride: 99 mEq/L (ref 96–112)
GFR: 97.06 mL/min (ref >60.00)
Glucose, Bld: 112 mg/dL — ABNORMAL HIGH (ref 70–99)
Potassium: 5.1 mEq/L (ref 3.5–5.1)
Sodium: 134 mEq/L — ABNORMAL LOW (ref 135–145)
Total Protein: 7.3 g/dL (ref 6.0–8.3)

## 2017-02-08 LAB — LIPID PANEL
CHOLESTEROL: 228 mg/dL — AB (ref 0–200)
HDL: 75.1 mg/dL (ref >39.00)
LDL Cholesterol: 126 mg/dL — ABNORMAL HIGH (ref 0–99)
NonHDL: 152.82
TRIGLYCERIDES: 134 mg/dL (ref 0.0–149.0)
Total CHOL/HDL Ratio: 3
VLDL: 26.8 mg/dL (ref 0.0–40.0)

## 2017-02-08 NOTE — Progress Notes (Addendum)
Subjective:  Patient ID: Michele Hess, female    DOB: [birth year]  Age: 61 y.o. MRN: [id number]  CC: Diagnoses of High risk medication use and Hyperlipidemia with target LDL less than 100 were pertinent to this visit.  HPI [name] presents for LAB VISIT   Outpatient Medications Prior to Visit  Medication Sig Dispense Refill  . calcium carbonate (CALCIUM 600) 600 MG TABS tablet Take 1,200 mg by mouth daily with breakfast.    . Multiple Vitamin (MULTIVITAMIN) tablet Take 1 tablet by mouth daily.    . Omega-3 Fatty Acids (FISH OIL) 1000 MG CAPS Take by mouth 2 (two) times daily.    Marland Kitchen omeprazole (PRILOSEC) 20 MG capsule TAKE 1 CAPSULE (20 MG TOTAL) BY MOUTH DAILY. 90 capsule 3  . ranitidine (ZANTAC) 150 MG tablet TAKE 1 TABLET (150 MG TOTAL) BY MOUTH 2 (TWO) TIMES DAILY. 120 tablet 1  . amoxicillin-clavulanate (AUGMENTIN) 875-125 MG tablet Take 1 tablet by mouth 2 (two) times daily. (Patient not taking: Reported on 03/24/2017) 14 tablet 0  . pravastatin (PRAVACHOL) 20 MG tablet TAKE 1 TABLET (20 MG TOTAL) BY MOUTH EVERY EVENING. 30 tablet 3   No facility-administered medications prior to visit.     Review of Systems;  Patient denies headache, fevers, malaise, unintentional weight loss, skin rash, eye pain, sinus congestion and sinus pain, sore throat, dysphagia,  hemoptysis , cough, dyspnea, wheezing, chest pain, palpitations, orthopnea, edema, abdominal pain, nausea, melena, diarrhea, constipation, flank pain, dysuria, hematuria, urinary  Frequency, nocturia, numbness, tingling, seizures,  Focal weakness, Loss of consciousness,  Tremor, insomnia, depression, anxiety, and suicidal ideation.      Objective:  There were no vitals taken for this visit.  BP Readings from Last 3 Encounters:  03/24/17 126/72  08/06/16 120/68  06/09/16 132/80    Wt Readings from Last 3 Encounters:  03/24/17 136 lb (61.7 kg)  08/06/16 131 lb 2 oz (59.5 kg)  06/09/16 131 lb (59.4 kg)    General  appearance: alert, cooperative and appears stated age Ears: normal TM's and external ear canals both ears Throat: lips, mucosa, and tongue normal; teeth and gums normal Neck: no adenopathy, no carotid bruit, supple, symmetrical, trachea midline and thyroid not enlarged, symmetric, no tenderness/mass/nodules Back: symmetric, no curvature. ROM normal. No CVA tenderness. Lungs: clear to auscultation bilaterally Heart: regular rate and rhythm, S1, S2 normal, no murmur, click, rub or gallop Abdomen: soft, non-tender; bowel sounds normal; no masses,  no organomegaly Pulses: 2+ and symmetric Skin: Skin color, texture, turgor normal. No rashes or lesions Lymph nodes: Cervical, supraclavicular, and axillary nodes normal.  No results found for: HGBA1C  Lab Results  Component Value Date   CREATININE 0.66 02/08/2017   CREATININE 0.61 08/06/2016   CREATININE 0.65 04/07/2016    Lab Results  Component Value Date   WBC 4.8 07/11/2014   HGB 13.7 07/11/2014   HCT 41.4 07/11/2014   PLT 295.0 07/11/2014   GLUCOSE 112 (H) 02/08/2017   CHOL 228 (H) 02/08/2017   TRIG 134.0 02/08/2017   HDL 75.10 02/08/2017   LDLDIRECT 137.0 06/05/2015   LDLCALC 126 (H) 02/08/2017   ALT 12 02/08/2017   AST 27 02/08/2017   NA 134 (L) 02/08/2017   K 5.1 02/08/2017   CL 99 02/08/2017   CREATININE 0.66 02/08/2017   BUN 11 02/08/2017   CO2 29 02/08/2017   TSH 1.41 08/06/2016    No results found.  Assessment & Plan:   Problem List Items Addressed  This Visit    Hyperlipidemia with target LDL less than 100    Other Visit Diagnoses    High risk medication use          I am having Ms. Michele Hess maintain her Fish Oil, calcium carbonate, multivitamin, ranitidine, and omeprazole.  No orders of the defined types were placed in this encounter.   There are no discontinued medications.  Follow-up: No Follow-up on file.   Crecencio Mc, MD

## 2017-02-09 ENCOUNTER — Encounter: Payer: Self-pay | Admitting: Internal Medicine

## 2017-02-10 ENCOUNTER — Ambulatory Visit: Payer: BC Managed Care – PPO | Admitting: Internal Medicine

## 2017-03-17 ENCOUNTER — Other Ambulatory Visit: Payer: Self-pay

## 2017-03-17 MED ORDER — PRAVASTATIN SODIUM 20 MG PO TABS: 20.0000 mg | ORAL_TABLET | Freq: Every evening | ORAL | 0 refills | Status: DC

## 2017-03-17 NOTE — Telephone Encounter (Signed)
Requested Prescriptions   Signed Prescriptions Disp Refills  . pravastatin (PRAVACHOL) 20 MG tablet 30 tablet 0    Sig: Take 1 tablet (20 mg total) by mouth every evening.    Authorizing Provider: Minna Merritts    Ordering User: Janan Ridge

## 2017-03-24 ENCOUNTER — Ambulatory Visit (INDEPENDENT_AMBULATORY_CARE_PROVIDER_SITE_OTHER): Payer: BC Managed Care – PPO | Admitting: Internal Medicine

## 2017-03-24 ENCOUNTER — Telehealth: Payer: Self-pay | Admitting: Cardiology

## 2017-03-24 ENCOUNTER — Encounter: Payer: Self-pay | Admitting: Internal Medicine

## 2017-03-24 DIAGNOSIS — K219 Gastro-esophageal reflux disease without esophagitis: Secondary | ICD-10-CM

## 2017-03-24 DIAGNOSIS — Z87898 Personal history of other specified conditions: Secondary | ICD-10-CM | POA: Diagnosis not present

## 2017-03-24 DIAGNOSIS — E785 Hyperlipidemia, unspecified: Secondary | ICD-10-CM

## 2017-03-24 NOTE — Patient Instructions (Signed)
Continue 20 mg pravastatin and we will repeat the lipids in 3 months (before thanksgiving )    To get the weight off:   Lose the crackers! Unless you use [employer] brand  Make sure you are getting 30 minutes of cardio  With weights a minimum of 4-5 times per week  (increase your metabolism)  Portion size/portion control!  Stop when you are full  Incarease your water intake  Try the Broccoli/Kale slaws and use Ginger/soy dressing instead of mayonnaise

## 2017-03-24 NOTE — Assessment & Plan Note (Addendum)
Tolerating pravastatin. Recent slight elevation due to dietary indiscretions over the summer. Patient plans to tighten her diet,  Will  repeat in 6 months .  Lab Results  Component Value Date   CHOL 228 (H) 02/08/2017   HDL 75.10 02/08/2017   LDLCALC 126 (H) 02/08/2017   LDLDIRECT 137.0 06/05/2015   TRIG 134.0 02/08/2017   CHOLHDL 3 02/08/2017

## 2017-03-24 NOTE — Telephone Encounter (Signed)
-----   Message from Janan Ridge, [place] sent at 03/17/2017  9:44 AM EDT ----- Can you try to schedule a follow up appointment with patient. This is a former [facility] patient was seen by her 03/2016. She is wanting refills. I will send in a 30 day refill.

## 2017-03-24 NOTE — Telephone Encounter (Signed)
Spoke with patient about scheduling appointment  She states her Internist will be handling her medications for now  But will call us if she needs [place]  Nothing further needed.

## 2017-03-24 NOTE — Progress Notes (Signed)
Subjective:  Patient ID: Michele Hess, female    DOB: Aug 09, 1955  Age: 61 y.o. MRN: [id number]  CC: Diagnoses of Hyperlipidemia with target LDL less than 100, History of syncope, and Gastroesophageal reflux disease without esophagitis were pertinent to this visit.  HPI KEERTHANA VANROSSUM presents for follow up  On multiple issues, including atypical chest pain, hyperlipidemia, and history of syncope.   She has had no episodes of chest pain or syncope in over a year.  Tolerating her medications without side effects.    Has gained 10 lbs, over the past year,  Worried about continued gain.  Not running as much due to left hip pain .  Diet has been inclusive of more starches,  Eating a lot of  crackers  .  Diet and exercise discussed in detail.   Outpatient Medications Prior to Visit  Medication Sig Dispense Refill  . calcium carbonate (CALCIUM 600) 600 MG TABS tablet Take 1,200 mg by mouth daily with breakfast.    . Multiple Vitamin (MULTIVITAMIN) tablet Take 1 tablet by mouth daily.    . Omega-3 Fatty Acids (FISH OIL) 1000 MG CAPS Take by mouth 2 (two) times daily.    Marland Kitchen omeprazole (PRILOSEC) 20 MG capsule TAKE 1 CAPSULE (20 MG TOTAL) BY MOUTH DAILY. 90 capsule 3  . pravastatin (PRAVACHOL) 20 MG tablet Take 1 tablet (20 mg total) by mouth every evening. 30 tablet 0  . ranitidine (ZANTAC) 150 MG tablet TAKE 1 TABLET (150 MG TOTAL) BY MOUTH 2 (TWO) TIMES DAILY. 120 tablet 1  . amoxicillin-clavulanate (AUGMENTIN) 875-125 MG tablet Take 1 tablet by mouth 2 (two) times daily. (Patient not taking: Reported on 03/24/2017) 14 tablet 0   No facility-administered medications prior to visit.     Review of Systems;  Patient denies headache, fevers, malaise, unintentional weight loss, skin rash, eye pain, sinus congestion and sinus pain, sore throat, dysphagia,  hemoptysis , cough, dyspnea, wheezing, chest pain, palpitations, orthopnea, edema, abdominal pain, nausea, melena, diarrhea, constipation, flank  pain, dysuria, hematuria, urinary  Frequency, nocturia, numbness, tingling, seizures,  Focal weakness, Loss of consciousness,  Tremor, insomnia, depression, anxiety, and suicidal ideation.      Objective:  BP 126/72 (BP Location: Left Arm, Patient Position: Sitting, Cuff Size: Normal)   Pulse 93   Temp 98.1 F (36.7 C) (Oral)   Resp 16   Ht 5' 2.75" (1.594 m)   Wt 136 lb (61.7 kg)   SpO2 96%   BMI 24.28 kg/m   BP Readings from Last 3 Encounters:  03/24/17 126/72  08/06/16 120/68  06/09/16 132/80    Wt Readings from Last 3 Encounters:  03/24/17 136 lb (61.7 kg)  08/06/16 131 lb 2 oz (59.5 kg)  06/09/16 131 lb (59.4 kg)    General appearance: alert, cooperative and appears stated age Ears: normal TM's and external ear canals both ears Throat: lips, mucosa, and tongue normal; teeth and gums normal Neck: no adenopathy, no carotid bruit, supple, symmetrical, trachea midline and thyroid not enlarged, symmetric, no tenderness/mass/nodules Back: symmetric, no curvature. ROM normal. No CVA tenderness. Lungs: clear to auscultation bilaterally Heart: regular rate and rhythm, S1, S2 normal, no murmur, click, rub or gallop Abdomen: soft, non-tender; bowel sounds normal; no masses,  no organomegaly Pulses: 2+ and symmetric Skin: Skin color, texture, turgor normal. No rashes or lesions Lymph nodes: Cervical, supraclavicular, and axillary nodes normal.  No results found for: HGBA1C  Lab Results  Component Value Date   CREATININE 0.66  02/08/2017   CREATININE 0.61 08/06/2016   CREATININE 0.65 04/07/2016    Lab Results  Component Value Date   WBC 4.8 07/11/2014   HGB 13.7 07/11/2014   HCT 41.4 07/11/2014   PLT 295.0 07/11/2014   GLUCOSE 112 (H) 02/08/2017   CHOL 228 (H) 02/08/2017   TRIG 134.0 02/08/2017   HDL 75.10 02/08/2017   LDLDIRECT 137.0 06/05/2015   LDLCALC 126 (H) 02/08/2017   ALT 12 02/08/2017   AST 27 02/08/2017   NA 134 (L) 02/08/2017   K 5.1 02/08/2017   CL  99 02/08/2017   CREATININE 0.66 02/08/2017   BUN 11 02/08/2017   CO2 29 02/08/2017   TSH 1.41 08/06/2016    No results found.  Assessment & Plan:   Problem List Items Addressed This Visit    GERD (gastroesophageal reflux disease)    Discussed current controversy regarding prolonged use of PPI in patients without documented Barretts esophagus.  Tolerating use of zantac twice daily 150 mg dose.  If GERD symptoms return,  advised her to accept referral for EGD.       History of syncope    Workup negative for arrhythmias by 24 Hr monitor and seizure focus by EEG.   Occurred in the setting of dehydration with orthostasis suspected, has not recurred in over a year.       Hyperlipidemia with target LDL less than 100    Tolerating pravastatin. Recent slight elevation due to dietary indiscretions over the summer. Patient plans to tighten her diet,  Will  repeat in 6 months .  Lab Results  Component Value Date   CHOL 228 (H) 02/08/2017   HDL 75.10 02/08/2017   LDLCALC 126 (H) 02/08/2017   LDLDIRECT 137.0 06/05/2015   TRIG 134.0 02/08/2017   CHOLHDL 3 02/08/2017         Relevant Orders   Lipid panel   Comprehensive metabolic panel    A total of 25 minutes of face to face time was spent with patient more than half of which was spent in counselling about the above mentioned conditions  and coordination of care   I have discontinued Ms. Blane's amoxicillin-clavulanate. I am also having her maintain her Fish Oil, calcium carbonate, multivitamin, ranitidine, omeprazole, pravastatin, and EMERGEN-C VITAMIN C.  Meds ordered this encounter  Medications  . Multiple Vitamins-Minerals (EMERGEN-C VITAMIN C) PACK    Sig: Take 1 packet by mouth daily.    Medications Discontinued During This Encounter  Medication Reason  . amoxicillin-clavulanate (AUGMENTIN) 875-125 MG tablet Completed Course    Follow-up: Return for fasting labs 3 months,  OV 6 .   Crecencio Mc, MD

## 2017-03-27 ENCOUNTER — Encounter: Payer: Self-pay | Admitting: Internal Medicine

## 2017-03-27 NOTE — Assessment & Plan Note (Addendum)
Workup negative for arrhythmias by 24 Hr monitor and seizure focus by EEG.   Occurred in the setting of dehydration with orthostasis suspected, has not recurred in over a year.

## 2017-03-27 NOTE — Assessment & Plan Note (Signed)
Discussed current controversy regarding prolonged use of PPI in patients without documented Barretts esophagus.  Tolerating use of zantac twice daily 150 mg dose.  If GERD symptoms return,  advised her to accept referral for EGD.

## 2017-04-16 ENCOUNTER — Other Ambulatory Visit: Payer: Self-pay | Admitting: Cardiovascular Disease

## 2017-04-26 ENCOUNTER — Encounter: Payer: Self-pay | Admitting: Internal Medicine

## 2017-04-27 MED ORDER — PRAVASTATIN SODIUM 20 MG PO TABS: 20.0000 mg | ORAL_TABLET | Freq: Every evening | ORAL | 0 refills | Status: DC

## 2017-04-27 NOTE — Telephone Encounter (Signed)
This is usually filled by Dr. Rockey Situ, last filled by him in August.  Last Labs were in July for [place] and a visit in August.  Thanks

## 2017-06-30 ENCOUNTER — Telehealth: Payer: Self-pay | Admitting: Internal Medicine

## 2017-06-30 ENCOUNTER — Other Ambulatory Visit (INDEPENDENT_AMBULATORY_CARE_PROVIDER_SITE_OTHER): Payer: BC Managed Care – PPO

## 2017-06-30 DIAGNOSIS — E785 Hyperlipidemia, unspecified: Secondary | ICD-10-CM | POA: Diagnosis not present

## 2017-06-30 DIAGNOSIS — Z1239 Encounter for other screening for malignant neoplasm of breast: Secondary | ICD-10-CM

## 2017-06-30 LAB — COMPREHENSIVE METABOLIC PANEL
ALBUMIN: 4.4 g/dL (ref 3.5–5.2)
ALT: 9 U/L (ref 0–35)
AST: 20 U/L (ref 0–37)
Alkaline Phosphatase: 51 U/L (ref 39–117)
BUN: 14 mg/dL (ref 6–23)
CO2: 30 mEq/L (ref 19–32)
Calcium: 9.7 mg/dL (ref 8.4–10.5)
Chloride: 100 mEq/L (ref 96–112)
Creatinine, Ser: 0.64 mg/dL (ref 0.40–1.20)
GFR: 100.43 mL/min (ref >60.00)
Glucose, Bld: 106 mg/dL — ABNORMAL HIGH (ref 70–99)
POTASSIUM: 4.8 meq/L (ref 3.5–5.1)
Sodium: 136 mEq/L (ref 135–145)
TOTAL PROTEIN: 7.1 g/dL (ref 6.0–8.3)
Total Bilirubin: 0.6 mg/dL (ref 0.2–1.2)

## 2017-06-30 LAB — LIPID PANEL
CHOLESTEROL: 191 mg/dL (ref 0–200)
HDL: 76.1 mg/dL (ref >39.00)
LDL CALC: 103 mg/dL — AB (ref 0–99)
NonHDL: 115.24
TRIGLYCERIDES: 62 mg/dL (ref 0.0–149.0)
Total CHOL/HDL Ratio: 3
VLDL: 12.4 mg/dL (ref 0.0–40.0)

## 2017-06-30 NOTE — Telephone Encounter (Signed)
Pt would like to get a 3D mammo at [facility]. Thank you!

## 2017-06-30 NOTE — Telephone Encounter (Signed)
Spoke with pt and informed her that the order has been placed. The pt stated that she would give [facility] a call  To schedule.

## 2017-06-30 NOTE — Telephone Encounter (Signed)
Mammogram has been ordered

## 2017-07-04 ENCOUNTER — Encounter: Payer: Self-pay | Admitting: Internal Medicine

## 2017-07-22 ENCOUNTER — Ambulatory Visit
Admission: RE | Admit: 2017-07-22 | Discharge: 2017-07-22 | Disposition: A | Discharge status: Home / Self Care | Payer: BC Managed Care – PPO | Source: Ambulatory Visit | Attending: Internal Medicine | Admitting: Internal Medicine

## 2017-07-22 DIAGNOSIS — Z1231 Encounter for screening mammogram for malignant neoplasm of breast: Secondary | ICD-10-CM | POA: Insufficient documentation

## 2017-07-22 DIAGNOSIS — Z1239 Encounter for other screening for malignant neoplasm of breast: Secondary | ICD-10-CM

## 2017-07-23 ENCOUNTER — Other Ambulatory Visit: Payer: Self-pay | Admitting: *Deleted

## 2017-07-23 ENCOUNTER — Inpatient Hospital Stay
Admission: RE | Admit: 2017-07-23 | Discharge: 2017-07-23 | Disposition: A | Discharge status: Home / Self Care | Payer: Self-pay | Source: Ambulatory Visit | Attending: *Deleted | Admitting: *Deleted

## 2017-07-23 DIAGNOSIS — Z9289 Personal history of other medical treatment: Secondary | ICD-10-CM

## 2017-07-24 ENCOUNTER — Encounter: Payer: Self-pay | Admitting: Internal Medicine

## 2017-08-03 HISTORY — PX: COLONOSCOPY: SHX174

## 2017-08-03 HISTORY — PX: POLYPECTOMY: SHX149

## 2017-08-10 ENCOUNTER — Other Ambulatory Visit: Payer: Self-pay

## 2017-08-10 MED ORDER — PRAVASTATIN SODIUM 20 MG PO TABS: 20.0000 mg | ORAL_TABLET | Freq: Every evening | ORAL | 0 refills | Status: DC

## 2017-08-19 ENCOUNTER — Encounter: Payer: Self-pay | Admitting: Internal Medicine

## 2017-08-19 ENCOUNTER — Ambulatory Visit (INDEPENDENT_AMBULATORY_CARE_PROVIDER_SITE_OTHER): Payer: BC Managed Care – PPO | Admitting: Internal Medicine

## 2017-08-19 VITALS — BP 128/78 | HR 72 | Temp 98.2°F | Resp 15 | Ht 62.75 in | Wt 130.6 lb

## 2017-08-19 DIAGNOSIS — R030 Elevated blood-pressure reading, without diagnosis of hypertension: Secondary | ICD-10-CM | POA: Diagnosis not present

## 2017-08-19 DIAGNOSIS — M85859 Other specified disorders of bone density and structure, unspecified thigh: Secondary | ICD-10-CM

## 2017-08-19 DIAGNOSIS — Z87898 Personal history of other specified conditions: Secondary | ICD-10-CM

## 2017-08-19 DIAGNOSIS — Z9289 Personal history of other medical treatment: Secondary | ICD-10-CM

## 2017-08-19 DIAGNOSIS — Z0000 Encounter for general adult medical examination without abnormal findings: Secondary | ICD-10-CM

## 2017-08-19 DIAGNOSIS — E785 Hyperlipidemia, unspecified: Secondary | ICD-10-CM

## 2017-08-19 MED ORDER — ZOSTER VAC RECOMB ADJUVANTED 50 MCG/0.5ML IM SUSR: 0.5000 mL | Freq: Once | INTRAMUSCULAR | 1 refills | Status: AC

## 2017-08-19 NOTE — Patient Instructions (Signed)
We will repeat your PAP smear and your DEXA scan next year  Return in May FOR LABS AND A BP CHECK (unless your home readings are < 120/70)    Health Maintenance for Postmenopausal Women Menopause is a normal process in which your reproductive ability comes to an end. This process happens gradually over a span of months to years, usually between the ages of 72 and 32. Menopause is complete when you have missed 12 consecutive menstrual periods. It is important to talk with your health care provider about some of the most common conditions that affect postmenopausal women, such as heart disease, cancer, and bone loss (osteoporosis). Adopting a healthy lifestyle and getting preventive care can help to promote your health and wellness. Those actions can also lower your chances of developing some of these common conditions. What should I know about menopause? During menopause, you may experience a number of symptoms, such as:  Moderate-to-severe hot flashes.  Night sweats.  Decrease in sex drive.  Mood swings.  Headaches.  Tiredness.  Irritability.  Memory problems.  Insomnia.  Choosing to treat or not to treat menopausal changes is an individual decision that you make with your health care provider. What should I know about hormone replacement therapy and supplements? Hormone therapy products are effective for treating symptoms that are associated with menopause, such as hot flashes and night sweats. Hormone replacement carries certain risks, especially as you become older. If you are thinking about using estrogen or estrogen with progestin treatments, discuss the benefits and risks with your health care provider. What should I know about heart disease and stroke? Heart disease, heart attack, and stroke become more likely as you age. This may be due, in part, to the hormonal changes that your body experiences during menopause. These can affect how your body processes dietary fats,  triglycerides, and cholesterol. Heart attack and stroke are both medical emergencies. There are many things that you can do to help prevent heart disease and stroke:  Have your blood pressure checked at least every 1-2 years. High blood pressure causes heart disease and increases the risk of stroke.  If you are 23-30 years old, ask your health care provider if you should take aspirin to prevent a heart attack or a stroke.  Do not use any tobacco products, including cigarettes, chewing tobacco, or electronic cigarettes. If you need help quitting, ask your health care provider.  It is important to eat a healthy diet and maintain a healthy weight. ? Be sure to include plenty of vegetables, fruits, low-fat dairy products, and lean protein. ? Avoid eating foods that are high in solid fats, added sugars, or salt (sodium).  Get regular exercise. This is one of the most important things that you can do for your health. ? Try to exercise for at least 150 minutes each week. The type of exercise that you do should increase your heart rate and make you sweat. This is known as moderate-intensity exercise. ? Try to do strengthening exercises at least twice each week. Do these in addition to the moderate-intensity exercise.  Know your numbers.Ask your health care provider to check your cholesterol and your blood glucose. Continue to have your blood tested as directed by your health care provider.  What should I know about cancer screening? There are several types of cancer. Take the following steps to reduce your risk and to catch any cancer development as early as possible. Breast Cancer  Practice breast self-awareness. ? This means understanding how  your breasts normally appear and feel. ? It also means doing regular breast self-exams. Let your health care provider know about any changes, no matter how small.  If you are 72 or older, have a clinician do a breast exam (clinical breast exam or CBE)  every year. Depending on your age, family history, and medical history, it may be recommended that you also have a yearly breast X-ray (mammogram).  If you have a family history of breast cancer, talk with your health care provider about genetic screening.  If you are at high risk for breast cancer, talk with your health care provider about having an MRI and a mammogram every year.  Breast cancer (BRCA) gene test is recommended for women who have family members with BRCA-related cancers. Results of the assessment will determine the need for genetic counseling and BRCA1 and for BRCA2 testing. BRCA-related cancers include these types: ? Breast. This occurs in males or females. ? Ovarian. ? Tubal. This may also be called fallopian tube cancer. ? Cancer of the abdominal or pelvic lining (peritoneal cancer). ? Prostate. ? Pancreatic.  Cervical, Uterine, and Ovarian Cancer Your health care provider may recommend that you be screened regularly for cancer of the pelvic organs. These include your ovaries, uterus, and vagina. This screening involves a pelvic exam, which includes checking for microscopic changes to the surface of your cervix (Pap test).  For women ages 21-65, health care providers may recommend a pelvic exam and a Pap test every three years. For women ages 51-65, they may recommend the Pap test and pelvic exam, combined with testing for human papilloma virus (HPV), every five years. Some types of HPV increase your risk of cervical cancer. Testing for HPV may also be done on women of any age who have unclear Pap test results.  Other health care providers may not recommend any screening for nonpregnant women who are considered low risk for pelvic cancer and have no symptoms. Ask your health care provider if a screening pelvic exam is right for you.  If you have had past treatment for cervical cancer or a condition that could lead to cancer, you need Pap tests and screening for cancer for at  least 20 years after your treatment. If Pap tests have been discontinued for you, your risk factors (such as having a new sexual partner) need to be reassessed to determine if you should start having screenings again. Some women have medical problems that increase the chance of getting cervical cancer. In these cases, your health care provider may recommend that you have screening and Pap tests more often.  If you have a family history of uterine cancer or ovarian cancer, talk with your health care provider about genetic screening.  If you have vaginal bleeding after reaching menopause, tell your health care provider.  There are currently no reliable tests available to screen for ovarian cancer.  Lung Cancer Lung cancer screening is recommended for adults 80-19 years old who are at high risk for lung cancer because of a history of smoking. A yearly low-dose CT scan of the lungs is recommended if you:  Currently smoke.  Have a history of at least 30 pack-years of smoking and you currently smoke or have quit within the past 15 years. A pack-year is smoking an average of one pack of cigarettes per day for one year.  Yearly screening should:  Continue until it has been 15 years since you quit.  Stop if you develop a health problem that  would prevent you from having lung cancer treatment.  Colorectal Cancer  This type of cancer can be detected and can often be prevented.  Routine colorectal cancer screening usually begins at age 72 and continues through age 62.  If you have risk factors for colon cancer, your health care provider may recommend that you be screened at an earlier age.  If you have a family history of colorectal cancer, talk with your health care provider about genetic screening.  Your health care provider may also recommend using home test kits to check for hidden blood in your stool.  A small camera at the end of a tube can be used to examine your colon directly  (sigmoidoscopy or colonoscopy). This is done to check for the earliest forms of colorectal cancer.  Direct examination of the colon should be repeated every 5-10 years until age 62. However, if early forms of precancerous polyps or small growths are found or if you have a family history or genetic risk for colorectal cancer, you may need to be screened more often.  Skin Cancer  Check your skin from head to toe regularly.  Monitor any moles. Be sure to tell your health care provider: ? About any new moles or changes in moles, especially if there is a change in a mole's shape or color. ? If you have a mole that is larger than the size of a pencil eraser.  If any of your family members has a history of skin cancer, especially at a young age, talk with your health care provider about genetic screening.  Always use sunscreen. Apply sunscreen liberally and repeatedly throughout the day.  Whenever you are outside, protect yourself by wearing long sleeves, pants, a wide-brimmed hat, and sunglasses.  What should I know about osteoporosis? Osteoporosis is a condition in which bone destruction happens more quickly than new bone creation. After menopause, you may be at an increased risk for osteoporosis. To help prevent osteoporosis or the bone fractures that can happen because of osteoporosis, the following is recommended:  If you are 79-18 years old, get at least 1,000 mg of calcium and at least 600 mg of vitamin D per day.  If you are older than age 72 but younger than age 25, get at least 1,200 mg of calcium and at least 600 mg of vitamin D per day.  If you are older than age 52, get at least 1,200 mg of calcium and at least 800 mg of vitamin D per day.  Smoking and excessive alcohol intake increase the risk of osteoporosis. Eat foods that are rich in calcium and vitamin D, and do weight-bearing exercises several times each week as directed by your health care provider. What should I know about  how menopause affects my mental health? Depression may occur at any age, but it is more common as you become older. Common symptoms of depression include:  Low or sad mood.  Changes in sleep patterns.  Changes in appetite or eating patterns.  Feeling an overall lack of motivation or enjoyment of activities that you previously enjoyed.  Frequent crying spells.  Talk with your health care provider if you think that you are experiencing depression. What should I know about immunizations? It is important that you get and maintain your immunizations. These include:  Tetanus, diphtheria, and pertussis (Tdap) booster vaccine.  Influenza every year before the flu season begins.  Pneumonia vaccine.  Shingles vaccine.  Your health care provider may also recommend other immunizations.  This information is not intended to replace advice given to you by your health care provider. Make sure you discuss any questions you have with your health care provider. Document Released: 09/11/2005 Document Revised: 02/07/2016 Document Reviewed: 04/23/2015 Elsevier Interactive Patient Education  2018 [employer].

## 2017-08-19 NOTE — Progress Notes (Signed)
Patient ID: Michele Hess, female    DOB: 1955/08/09  Age: 62 y.o. MRN: [id number]  The patient is here for annual preventive examination and management of other chronic and acute problems.   She is Up to date on all screenings   The risk factors are reflected in the social history.  The roster of all physicians providing medical care to patient - is listed in the Snapshot section of the chart.  Activities of daily living:  The patient is 100% independent in all ADLs: dressing, toileting, feeding as well as independent mobility  Home safety : The patient has smoke detectors in the home. They wear seatbelts.  There are no firearms at home. There is no violence in the home.   There is no risks for hepatitis, STDs or HIV. There is no   history of blood transfusion. They have no travel history to infectious disease endemic areas of the world.  The patient has seen their dentist in the last six month. They have seen their eye doctor in the last year.    They do not  have excessive sun exposure. Discussed the need for sun protection: hats, long sleeves and use of sunscreen if there is significant sun exposure.   Diet: the importance of a healthy diet is discussed. They do have a healthy diet.  The benefits of regular aerobic exercise were discussed. She runs 5 days pwer week and is training for a half marathon .   Depression screen: there are no signs or vegative symptoms of depression- irritability, change in appetite, anhedonia, sadness/tearfullness.   The following portions of the patient's history were reviewed and updated as appropriate: allergies, current medications, past family history, past medical history,  past surgical history, past social history  and problem list.  Visual acuity was not assessed per patient preference since she has regular follow up with her ophthalmologist. Hearing and body mass index were assessed and reviewed.   During the course of the visit the patient was  educated and counseled about appropriate screening and preventive services including : fall prevention , diabetes screening, nutrition counseling, colorectal cancer screening, and recommended immunizations.    CC: The primary encounter diagnosis was Elevated blood pressure reading in office without diagnosis of hypertension. Diagnoses of Hyperlipidemia with target LDL less than 100, Osteopenia of neck of femur, unspecified laterality, Encounter for preventive health examination, History of nuclear stress test, and History of syncope were also pertinent to this visit.  History Alizandra has a past medical history of GERD (gastroesophageal reflux disease), History of ETT, HLD (hyperlipidemia), Menopausal and postmenopausal disorder, Seizure ([place]) (01/2070), and Syncope and collapse (01/27/2016).   She has a past surgical history that includes Partial hysterectomy; echo (other) (9/10); Total abdominal hysterectomy (1993); Anterior cruciate ligament repair (Left, 2011); and Tonsillectomy (1961).   Her family history includes Diabetes in her brother and mother; Heart attack (age of onset: 14) in her mother; Heart attack (age of onset: 70) in her father; Lung cancer in her brother.She reports that she quit smoking about 30 years ago. Her smoking use included cigarettes. She has a 17.00 pack-year smoking history. she has never used smokeless tobacco. She reports that she drinks about 1.8 oz of alcohol per week. She reports that she does not use drugs.  Outpatient Medications Prior to Visit  Medication Sig Dispense Refill  . calcium carbonate (CALCIUM 600) 600 MG TABS tablet Take 1,200 mg by mouth daily with breakfast.    . Multiple Vitamin (MULTIVITAMIN)  tablet Take 1 tablet by mouth daily.    . Multiple Vitamins-Minerals (EMERGEN-C VITAMIN C) PACK Take 1 packet by mouth daily.    . Omega-3 Fatty Acids (FISH OIL) 1000 MG CAPS Take by mouth 2 (two) times daily.    . pravastatin (PRAVACHOL) 20 MG tablet Take 1  tablet (20 mg total) by mouth every evening. 90 tablet 0  . omeprazole (PRILOSEC) 20 MG capsule TAKE 1 CAPSULE (20 MG TOTAL) BY MOUTH DAILY. (Patient not taking: Reported on 08/19/2017) 90 capsule 3  . ranitidine (ZANTAC) 150 MG tablet TAKE 1 TABLET (150 MG TOTAL) BY MOUTH 2 (TWO) TIMES DAILY. (Patient not taking: Reported on 08/19/2017) 120 tablet 1   No facility-administered medications prior to visit.     Review of Systems   Patient denies headache, fevers, malaise, unintentional weight loss, skin rash, eye pain, sinus congestion and sinus pain, sore throat, dysphagia,  hemoptysis , cough, dyspnea, wheezing, chest pain, palpitations, orthopnea, edema, abdominal pain, nausea, melena, diarrhea, constipation, flank pain, dysuria, hematuria, urinary  Frequency, nocturia, numbness, tingling, seizures,  Focal weakness, Loss of consciousness,  Tremor, insomnia, depression, anxiety, and suicidal ideation.     Objective:  BP 128/78 (BP Location: Left Arm, Patient Position: Sitting, Cuff Size: Normal)   Pulse 72   Temp 98.2 F (36.8 C) (Oral)   Resp 15   Ht 5' 2.75" (1.594 m)   Wt 130 lb 9.6 oz (59.2 kg)   SpO2 98%   BMI 23.32 kg/m   Physical Exam  General appearance: alert, cooperative and appears stated age Head: Normocephalic, without obvious abnormality, atraumatic Eyes: conjunctivae/corneas clear. PERRL, EOM's intact. Fundi benign. Ears: normal TM's and external ear canals both ears Nose: Nares normal. Septum midline. Mucosa normal. No drainage or sinus tenderness. Throat: lips, mucosa, and tongue normal; teeth and gums normal Neck: no adenopathy, no carotid bruit, no JVD, supple, symmetrical, trachea midline and thyroid not enlarged, symmetric, no tenderness/mass/nodules Lungs: clear to auscultation bilaterally Breasts: normal appearance, no masses or tenderness Heart: regular rate and rhythm, S1, S2 normal, no murmur, click, rub or gallop Abdomen: soft, non-tender; bowel sounds  normal; no masses,  no organomegaly Extremities: extremities normal, atraumatic, no cyanosis or edema Pulses: 2+ and symmetric Skin: Skin color, texture, turgor normal. No rashes or lesions Neurologic: Alert and oriented X 3, normal strength and tone. Normal symmetric reflexes. Normal coordination and gait.    Assessment & Plan:   Problem List Items Addressed This Visit    Elevated blood pressure reading in office without diagnosis of hypertension - Primary    She has no history of hypertension but has mild elevation today based on new guidelines   She has been asked to check her pressures at home and submit readings for evaluation. Renal function is normal .  Lab Results  Component Value Date   CREATININE 0.64 06/30/2017   Lab Results  Component Value Date   NA 136 06/30/2017   K 4.8 06/30/2017   CL 100 06/30/2017   CO2 30 06/30/2017         Encounter for preventive health examination    Annual comprehensive preventive exam was done as well as an evaluation and management of chronic conditions .  During the course of the visit the patient was educated and counseled about appropriate screening and preventive services including :  diabetes screening, lipid analysis with projected  10 year  risk for CAD , nutrition counseling, breast, cervical and colorectal cancer screening, and recommended immunizations.  Printed recommendations for health maintenance screenings was given      History of nuclear stress test   History of syncope   Hyperlipidemia with target LDL less than 100    Lipids improved by Nov labs  LDL 102 on pravastatin  And LFTS are normal. No changes today    Lab Results  Component Value Date   CHOL 191 06/30/2017   HDL 76.10 06/30/2017   LDLCALC 103 (H) 06/30/2017   LDLDIRECT 137.0 06/05/2015   TRIG 62.0 06/30/2017   CHOLHDL 3 06/30/2017   Lab Results  Component Value Date   ALT 9 06/30/2017   AST 20 06/30/2017   ALKPHOS 51 06/30/2017   BILITOT 0.6  06/30/2017         Osteopenia    DEXA scans reviewed with patient .   Osteopenia is improving,  t scores --2.0 at femur  Continue current medications/supplements and repeat in 5 years         I have discontinued Arriel L. Bauer's ranitidine and omeprazole. I am also having her start on Zoster Vaccine Adjuvanted. Additionally, I am having her maintain her Fish Oil, calcium carbonate, multivitamin, EMERGEN-C VITAMIN C, and pravastatin.  Meds ordered this encounter  Medications  . Zoster Vaccine Adjuvanted [facility]) injection    Sig: Inject 0.5 mLs into the muscle once for 1 dose.    Dispense:  1 each    Refill:  1    Medications Discontinued During This Encounter  Medication Reason  . omeprazole (PRILOSEC) 20 MG capsule Patient has not taken in last 30 days  . ranitidine (ZANTAC) 150 MG tablet Patient has not taken in last 30 days    Follow-up: Return in about 6 months (around 02/16/2018).   Crecencio Mc, MD

## 2017-08-21 DIAGNOSIS — R030 Elevated blood-pressure reading, without diagnosis of hypertension: Secondary | ICD-10-CM | POA: Insufficient documentation

## 2017-08-21 DIAGNOSIS — I10 Essential (primary) hypertension: Secondary | ICD-10-CM | POA: Insufficient documentation

## 2017-08-21 DIAGNOSIS — Z9289 Personal history of other medical treatment: Secondary | ICD-10-CM | POA: Insufficient documentation

## 2017-08-21 NOTE — Assessment & Plan Note (Signed)
DEXA scans reviewed with patient .   Osteopenia is improving,  t scores --2.0 at femur  Continue current medications/supplements and repeat in 5 years

## 2017-08-21 NOTE — Assessment & Plan Note (Signed)
She has no history of hypertension but has mild elevation today based on new guidelines   She has been asked to check her pressures at home and submit readings for evaluation. Renal function is normal .  Lab Results  Component Value Date   CREATININE 0.64 06/30/2017   Lab Results  Component Value Date   NA 136 06/30/2017   K 4.8 06/30/2017   CL 100 06/30/2017   CO2 30 06/30/2017

## 2017-08-21 NOTE — Assessment & Plan Note (Signed)
Lipids improved by Nov labs  LDL 102 on pravastatin  And LFTS are normal. No changes today    Lab Results  Component Value Date   CHOL 191 06/30/2017   HDL 76.10 06/30/2017   LDLCALC 103 (H) 06/30/2017   LDLDIRECT 137.0 06/05/2015   TRIG 62.0 06/30/2017   CHOLHDL 3 06/30/2017   Lab Results  Component Value Date   ALT 9 06/30/2017   AST 20 06/30/2017   ALKPHOS 51 06/30/2017   BILITOT 0.6 06/30/2017

## 2017-08-21 NOTE — Assessment & Plan Note (Signed)
Annual comprehensive preventive exam was done as well as an evaluation and management of chronic conditions .  During the course of the visit the patient was educated and counseled about appropriate screening and preventive services including :  diabetes screening, lipid analysis with projected  10 year  risk for CAD , nutrition counseling, breast, cervical and colorectal cancer screening, and recommended immunizations.  Printed recommendations for health maintenance screenings was given 

## 2017-08-26 ENCOUNTER — Ambulatory Visit: Payer: Self-pay

## 2017-08-26 NOTE — Telephone Encounter (Signed)
Patient initially called in for cough, but voiced concerns about having 2 small red rash areas on her back and back pain, wondering if she has singles. As far as the cough, she wasn't concerned and asked what kind of cough medicine could she take, I advised Robitussin DM or Delsym, cough drops, honey, humidifier in room. She was triaged for rash.  She says the rash is "very tiny, 2 small areas, not blisters but back has a burning type pain off an on. My daughter looked at my back yesterday and told me it was a rash there, so I was wondering if it was shingles developing."  I asked what size are the two areas, she said "about the size of the head of a pin." She reports having cold symptoms earlier in the week with chills, but did not check a temperature. She asks if she can see the doctor tomorrow, so she doesn't have to go the whole weekend without knowing. There was no availability with her provider or the providers practice, so I offered for her to be seen at [facility], she agrees. Appointment made for 08/27/17 with Clarene Reamer, FNP, care advice given, patient verbalized understanding.   Reason for Disposition . [1] Localized rash is very painful AND [2] no fever  Answer Assessment - Initial Assessment Questions 1. APPEARANCE of RASH: "Describe the rash."      Red 2. LOCATION: "Where is the rash located?"      Lower left Back 3. NUMBER: "How many spots are there?"      2  4. SIZE: "How big are the spots?" (Inches, centimeters or compare to size of a coin)      Pin head 5. ONSET: "When did the rash start?"      Yesterday 6. ITCHING: "Does the rash itch?" If so, ask: "How bad is the itch?"  (Scale 1-10; or mild, moderate, severe)     Denies 7. PAIN: "Does the rash hurt?" If so, ask: "How bad is the pain?"  (Scale 1-10; or mild, moderate, severe)     Off and on, right now 0, when it hurts 8-burning sensation 8. OTHER SYMPTOMS: "Do you have any other symptoms?" (e.g., fever)     Monday had  chills, but now no chills 9. PREGNANCY: "Is there any chance you are pregnant?" "When was your last menstrual period?"     N/A  Protocols used: RASH OR REDNESS - LOCALIZED-A-AH

## 2017-08-27 ENCOUNTER — Ambulatory Visit: Payer: BC Managed Care – PPO | Admitting: Family Medicine

## 2017-08-27 ENCOUNTER — Encounter: Payer: Self-pay | Admitting: Family Medicine

## 2017-08-27 VITALS — BP 112/70 | HR 80 | Temp 97.5°F | Wt 128.2 lb

## 2017-08-27 DIAGNOSIS — J069 Acute upper respiratory infection, unspecified: Secondary | ICD-10-CM

## 2017-08-27 DIAGNOSIS — B9789 Other viral agents as the cause of diseases classified elsewhere: Secondary | ICD-10-CM

## 2017-08-27 DIAGNOSIS — M546 Pain in thoracic spine: Secondary | ICD-10-CM | POA: Diagnosis not present

## 2017-08-27 NOTE — Patient Instructions (Signed)
It was good to see you today  For nasal congestion you can use Afrin nasal spray for 3 days max, Sudafed, saline nasal spray (generic is fine for all). For cough you can try Delsym. Drink enough fluids to make your urine light yellow. For fever/chill/muscle aches you can take over the counter acetaminophen or ibuprofen.  Please come back in if you are not better in 5-7 days or if you develop wheezing, shortness of breath or persistent vomiting.

## 2017-08-27 NOTE — Progress Notes (Signed)
Subjective:    Patient ID: Michele Hess, female    DOB: 1956/06/03, 62 y.o.   MRN: [id number]  HPI This is a 62 yo female who presents with rash on back x 2 days.  Had chills 5 days ago, cough x 4 days. Head congested, taking sudafed with good relief. Back pain started 4 days ago in mid back, then localized on left side. Burning and stinging of back. Intermittent, able to get relief with position change.  Has taken some Robitussin DM with improvement of cough.  Slept well last night. Cough and congestion improving. Has not taken anything for back pain. No radiation, no weakness/numbness/tingling.   Changed detergent 2 weeks ago, no other rash/itch/irritation.   Past Medical History:  Diagnosis Date  . GERD (gastroesophageal reflux disease)   . History of ETT    myoview 7/09: 9 min 31 sec, EF 71%, nov evidence for ischemia. ETT 99/10)" 1030", excellent exercise tolerance, no abnormal rhythms, no evidence for ischemia  . HLD (hyperlipidemia)   . Menopausal and postmenopausal disorder   . Seizure ([place]) 01/2070   Dr. Jannifer Franklin  . Syncope and collapse 01/27/2016   Past Surgical History:  Procedure Laterality Date  . ANTERIOR CRUCIATE LIGAMENT REPAIR Left 2011  . echo (other)  9/10   EF 60%, lv normal, RV normal, mild MR, trivial pericardial effusion   . PARTIAL HYSTERECTOMY    . TONSILLECTOMY  1961  . TOTAL ABDOMINAL HYSTERECTOMY  1993   [name]   Family History  Problem Relation Age of Onset  . Heart attack Mother 52  . Diabetes Mother   . Heart attack Father 35  . Lung cancer Brother   . Diabetes Brother   . Colon cancer Neg Hx   . Stomach cancer Neg Hx    Social History   Tobacco Use  . Smoking status: Former Smoker    Packs/day: 1.00    Years: 17.00    Pack years: 17.00    Types: Cigarettes    Last attempt to quit: 05/15/1987    Years since quitting: 30.3  . Smokeless tobacco: Never Used  . Tobacco comment: quit 27 years ago   Substance Use Topics  . Alcohol  use: Yes    Alcohol/week: 1.8 oz    Types: 3 Glasses of wine per week    Comment: rare  . Drug use: No      Review of Systems Per HPI    Objective:   Physical Exam  Constitutional: She is oriented to person, place, and time. She appears well-developed and well-nourished. No distress.  HENT:  Head: Normocephalic and atraumatic.  Right Ear: External ear normal.  Left Ear: External ear normal.  Nose: Nose normal.  Mouth/Throat: Oropharynx is clear and moist. No oropharyngeal exudate.  Eyes: Conjunctivae are normal.  Neck: Normal range of motion. Neck supple.  Cardiovascular: Normal rate, regular rhythm and normal heart sounds.  Pulmonary/Chest: Effort normal and breath sounds normal.  Musculoskeletal: Normal range of motion. She exhibits no edema or tenderness.  Lymphadenopathy:    She has no cervical adenopathy.  Neurological: She is alert and oriented to person, place, and time.  Skin: Skin is warm and dry. Rash (3 erythematous papules left lower back. No vessicles. ) noted. She is not diaphoretic.  Psychiatric: She has a normal mood and affect. Her behavior is normal. Judgment and thought content normal.  Vitals reviewed.     BP 112/70   Pulse 80   Temp (!)  97.5 F (36.4 C) (Oral)   Wt 128 lb 4 oz (58.2 kg)   SpO2 98%   BMI 22.90 kg/m  Wt Readings from Last 3 Encounters:  08/27/17 128 lb 4 oz (58.2 kg)  08/19/17 130 lb 9.6 oz (59.2 kg)  03/24/17 136 lb (61.7 kg)       Assessment & Plan:  1. Viral URI with cough - Provided written and verbal information regarding diagnosis and treatment. -  Patient Instructions  It was good to see you today  For nasal congestion you can use Afrin nasal spray for 3 days max, Sudafed, saline nasal spray (generic is fine for all). For cough you can try Delsym. Drink enough fluids to make your urine light yellow. For fever/chill/muscle aches you can take over the counter acetaminophen or ibuprofen.  Please come back in if you  are not better in 5-7 days or if you develop wheezing, shortness of breath or persistent vomiting.    2. Acute left-sided thoracic back pain - low suspicion for shingles in absence of vesicles, persistent discomfort. Likely strain from cough - Discussed use of otc NSAIDs - RTC/ER precautions reveiwed   Clarene Reamer, FNP-BC  [facility], [facility]  08/27/2017 12:56 PM

## 2017-09-15 ENCOUNTER — Encounter: Payer: Self-pay | Admitting: Internal Medicine

## 2017-10-17 ENCOUNTER — Encounter: Payer: Self-pay | Admitting: Gastroenterology

## 2017-10-27 ENCOUNTER — Encounter: Payer: Self-pay | Admitting: Gastroenterology

## 2017-11-03 ENCOUNTER — Other Ambulatory Visit: Payer: Self-pay | Admitting: Internal Medicine

## 2017-11-15 ENCOUNTER — Other Ambulatory Visit: Payer: Self-pay

## 2017-11-15 ENCOUNTER — Ambulatory Visit (AMBULATORY_SURGERY_CENTER): Payer: Self-pay

## 2017-11-15 VITALS — Ht 62.5 in | Wt 130.4 lb

## 2017-11-15 DIAGNOSIS — Z1211 Encounter for screening for malignant neoplasm of colon: Secondary | ICD-10-CM

## 2017-11-15 MED ORDER — NA SULFATE-K SULFATE-MG SULF 17.5-3.13-1.6 GM/177ML PO SOLN: 1.0000 | Freq: Once | ORAL | 0 refills | Status: AC

## 2017-11-15 NOTE — Progress Notes (Signed)
No egg or soy allergy known to patient  No issues with past sedation with any surgeries  or procedures, no intubation problems  No diet pills per patient No home 02 use per patient  No blood thinners per patient  Pt denies issues with constipation  No A fib or A flutter  EMMI video sent to pt's e mail , sent during previsit

## 2017-12-02 ENCOUNTER — Encounter: Payer: BC Managed Care – PPO | Admitting: Gastroenterology

## 2017-12-03 ENCOUNTER — Encounter: Payer: Self-pay | Admitting: Gastroenterology

## 2017-12-16 ENCOUNTER — Encounter: Payer: BC Managed Care – PPO | Admitting: Gastroenterology

## 2017-12-30 ENCOUNTER — Encounter: Payer: Self-pay | Admitting: Internal Medicine

## 2017-12-30 DIAGNOSIS — Z79899 Other long term (current) drug therapy: Secondary | ICD-10-CM

## 2017-12-30 DIAGNOSIS — E785 Hyperlipidemia, unspecified: Secondary | ICD-10-CM

## 2018-01-05 ENCOUNTER — Encounter: Payer: Self-pay | Admitting: Gastroenterology

## 2018-01-05 ENCOUNTER — Other Ambulatory Visit: Payer: Self-pay

## 2018-01-05 MED ORDER — RANITIDINE HCL 150 MG PO TABS: 150.0000 mg | ORAL_TABLET | Freq: Two times a day (BID) | ORAL | 2 refills | Status: DC

## 2018-02-02 ENCOUNTER — Other Ambulatory Visit (INDEPENDENT_AMBULATORY_CARE_PROVIDER_SITE_OTHER): Payer: BC Managed Care – PPO

## 2018-02-02 DIAGNOSIS — E785 Hyperlipidemia, unspecified: Secondary | ICD-10-CM

## 2018-02-02 DIAGNOSIS — Z79899 Other long term (current) drug therapy: Secondary | ICD-10-CM

## 2018-02-02 LAB — COMPREHENSIVE METABOLIC PANEL
ALBUMIN: 4.5 g/dL (ref 3.5–5.2)
ALT: 10 U/L (ref 0–35)
AST: 23 U/L (ref 0–37)
Alkaline Phosphatase: 49 U/L (ref 39–117)
BILIRUBIN TOTAL: 0.4 mg/dL (ref 0.2–1.2)
BUN: 15 mg/dL (ref 6–23)
CALCIUM: 9.7 mg/dL (ref 8.4–10.5)
CO2: 28 mEq/L (ref 19–32)
CREATININE: 0.64 mg/dL (ref 0.40–1.20)
Chloride: 102 mEq/L (ref 96–112)
GFR: 100.23 mL/min (ref >60.00)
Glucose, Bld: 104 mg/dL — ABNORMAL HIGH (ref 70–99)
Potassium: 4.9 mEq/L (ref 3.5–5.1)
SODIUM: 138 meq/L (ref 135–145)
Total Protein: 7.2 g/dL (ref 6.0–8.3)

## 2018-02-02 LAB — LIPID PANEL
Cholesterol: 208 mg/dL — ABNORMAL HIGH (ref 0–200)
HDL: 78.9 mg/dL (ref >39.00)
LDL Cholesterol: 115 mg/dL — ABNORMAL HIGH (ref 0–99)
NonHDL: 128.79
TRIGLYCERIDES: 71 mg/dL (ref 0.0–149.0)
Total CHOL/HDL Ratio: 3
VLDL: 14.2 mg/dL (ref 0.0–40.0)

## 2018-02-07 ENCOUNTER — Ambulatory Visit: Payer: BC Managed Care – PPO | Admitting: Internal Medicine

## 2018-02-07 ENCOUNTER — Encounter: Payer: Self-pay | Admitting: Internal Medicine

## 2018-02-07 ENCOUNTER — Telehealth: Payer: Self-pay | Admitting: Gastroenterology

## 2018-02-07 VITALS — BP 132/88 | HR 87 | Temp 98.0°F | Resp 14 | Ht 62.5 in | Wt 128.0 lb

## 2018-02-07 DIAGNOSIS — R030 Elevated blood-pressure reading, without diagnosis of hypertension: Secondary | ICD-10-CM

## 2018-02-07 DIAGNOSIS — E785 Hyperlipidemia, unspecified: Secondary | ICD-10-CM | POA: Diagnosis not present

## 2018-02-07 MED ORDER — PRAVASTATIN SODIUM 20 MG PO TABS: ORAL_TABLET | ORAL | 0 refills | Status: DC

## 2018-02-07 NOTE — Progress Notes (Signed)
Subjective:  Patient ID: Michele Hess, female    DOB: 1955-12-31  Age: 62 y.o. MRN: [id number]  CC: The primary encounter diagnosis was Elevated blood pressure reading in office without diagnosis of hypertension. A diagnosis of Hyperlipidemia with target LDL less than 100 was also pertinent to this visit.  HPI Michele Hess presents for follow up on  elevated blood pressure ,  Hyperlipidemia.   She and husband are downsizing home in order to maintain residence here in [place] as well as a winter home in [place]   via a 2 bedroom townhome in each locale   Getting a crown tomorrow Colonoscopy  On  Wednesday [facility] Tuesday   Outpatient Medications Prior to Visit  Medication Sig Dispense Refill  . calcium carbonate (CALCIUM 600) 600 MG TABS tablet Take 1,200 mg by mouth daily with breakfast.    . Multiple Vitamin (MULTIVITAMIN) tablet Take 1 tablet by mouth daily.    . Multiple Vitamins-Minerals (EMERGEN-C VITAMIN C) PACK Take 1 packet by mouth daily.    . Omega-3 Fatty Acids (FISH OIL) 1000 MG CAPS Take by mouth 2 (two) times daily.    . ranitidine (ZANTAC) 150 MG tablet Take 1 tablet (150 mg total) by mouth 2 (two) times daily. May take 1-2 times a day. 60 tablet 2  . pravastatin (PRAVACHOL) 20 MG tablet TAKE 1 TABLET BY MOUTH EVERY DAY IN THE EVENING 90 tablet 1   No facility-administered medications prior to visit.     Review of Systems;  Patient denies headache, fevers, malaise, unintentional weight loss, skin rash, eye pain, sinus congestion and sinus pain, sore throat, dysphagia,  hemoptysis , cough, dyspnea, wheezing, chest pain, palpitations, orthopnea, edema, abdominal pain, nausea, melena, diarrhea, constipation, flank pain, dysuria, hematuria, urinary  Frequency, nocturia, numbness, tingling, seizures,  Focal weakness, Loss of consciousness,  Tremor, insomnia, depression, anxiety, and suicidal ideation.      Objective:  BP 132/88 (BP Location: Left Arm, Patient  Position: Sitting, Cuff Size: Normal)   Pulse 87   Temp 98 F (36.7 C) (Oral)   Resp 14   Ht 5' 2.5" (1.588 m)   Wt 128 lb (58.1 kg)   SpO2 97%   BMI 23.04 kg/m   BP Readings from Last 3 Encounters:  02/07/18 132/88  08/27/17 112/70  08/19/17 128/78    Wt Readings from Last 3 Encounters:  02/07/18 128 lb (58.1 kg)  11/15/17 130 lb 6.4 oz (59.1 kg)  08/27/17 128 lb 4 oz (58.2 kg)    General appearance: alert, cooperative and appears stated age Ears: normal TM's and external ear canals both ears Throat: lips, mucosa, and tongue normal; teeth and gums normal Neck: no adenopathy, no carotid bruit, supple, symmetrical, trachea midline and thyroid not enlarged, symmetric, no tenderness/mass/nodules Back: symmetric, no curvature. ROM normal. No CVA tenderness. Lungs: clear to auscultation bilaterally Heart: regular rate and rhythm, S1, S2 normal, no murmur, click, rub or gallop Abdomen: soft, non-tender; bowel sounds normal; no masses,  no organomegaly Pulses: 2+ and symmetric Skin: Skin color, texture, turgor normal. No rashes or lesions Lymph nodes: Cervical, supraclavicular, and axillary nodes normal.  No results found for: HGBA1C  Lab Results  Component Value Date   CREATININE 0.64 02/02/2018   CREATININE 0.64 06/30/2017   CREATININE 0.66 02/08/2017    Lab Results  Component Value Date   WBC 4.8 07/11/2014   HGB 13.7 07/11/2014   HCT 41.4 07/11/2014   PLT 295.0 07/11/2014  GLUCOSE 104 (H) 02/02/2018   CHOL 208 (H) 02/02/2018   TRIG 71.0 02/02/2018   HDL 78.90 02/02/2018   LDLDIRECT 137.0 06/05/2015   LDLCALC 115 (H) 02/02/2018   ALT 10 02/02/2018   AST 23 02/02/2018   NA 138 02/02/2018   K 4.9 02/02/2018   CL 102 02/02/2018   CREATININE 0.64 02/02/2018   BUN 15 02/02/2018   CO2 28 02/02/2018   TSH 1.41 08/06/2016   MICROALBUR 0.9 02/07/2018    Mm Outside Films Mammo  Result Date: 07/23/2017 This examination belongs to an outside facility and is  stored here for comparison purposes only.  Contact the originating outside institution for any associated report or interpretation.   Assessment & Plan:   Problem List Items Addressed This Visit    Elevated blood pressure reading in office without diagnosis of hypertension - Primary    She has no history of hypertension but has mild elevation today based on new guidelines   She has been asked to check her pressures at home and submit readings for evaluation. Renal function is normal .  Lab Results  Component Value Date   CREATININE 0.64 02/02/2018   Lab Results  Component Value Date   NA 138 02/02/2018   K 4.9 02/02/2018   CL 102 02/02/2018   CO2 28 02/02/2018         Relevant Orders   Microalbumin / creatinine urine ratio (Completed)   Hyperlipidemia with target LDL less than 100    Lipids at or near goal on  pravastatin  And LFTS are normal. No changes today    Lab Results  Component Value Date   CHOL 208 (H) 02/02/2018   HDL 78.90 02/02/2018   LDLCALC 115 (H) 02/02/2018   LDLDIRECT 137.0 06/05/2015   TRIG 71.0 02/02/2018   CHOLHDL 3 02/02/2018   Lab Results  Component Value Date   ALT 10 02/02/2018   AST 23 02/02/2018   ALKPHOS 49 02/02/2018   BILITOT 0.4 02/02/2018         Relevant Medications   pravastatin (PRAVACHOL) 20 MG tablet     A total of 25 minutes of face to face time was spent with patient more than half of which was spent in counselling about the above mentioned conditions  and coordination of care  I am having Arabell L. Yves Dill maintain her Fish Oil, calcium carbonate, multivitamin, EMERGEN-C VITAMIN C, ranitidine, and pravastatin.  Meds ordered this encounter  Medications  . pravastatin (PRAVACHOL) 20 MG tablet    Sig: TAKE 1 TABLET BY MOUTH EVERY DAY IN THE EVENING    Dispense:  90 tablet    Refill:  0    Medications Discontinued During This Encounter  Medication Reason  . pravastatin (PRAVACHOL) 20 MG tablet Reorder    Follow-up: No  follow-ups on file.   Crecencio Mc, MD

## 2018-02-07 NOTE — Patient Instructions (Signed)
The majority of your blood pressure readings are normal.  The protein test , if negative,  Will be considered  normal and confirms tha t no medicati on is needed  .   I have refilled the pravastatin locally for 90 days  When you find a local pharmacy in [place],  Let me know so the next refill will be sent there

## 2018-02-07 NOTE — Telephone Encounter (Signed)
Pt is scheduled for a proc on Wednesday. She has an appt with her dentist tomorrow at 8:00am to replace a crown. She wants to know if it is ok.

## 2018-02-07 NOTE — Telephone Encounter (Signed)
Explained to patient that if she is having a basic crown placed tomorrow without anesthesia and no infection she is okay to proceed with colonoscopy on Wednesday. I did ask patient to call us back tomorrow if anything unusual happens during the routine crown placement. Pt verbalizes understanding and plans on coming on Wednesday.

## 2018-02-08 LAB — MICROALBUMIN / CREATININE URINE RATIO
Creatinine,U: 45.4 mg/dL
MICROALB UR: 0.9 mg/dL (ref 0.0–1.9)
Microalb Creat Ratio: 1.9 mg/g (ref 0.0–30.0)

## 2018-02-08 NOTE — Assessment & Plan Note (Signed)
Lipids at or near goal on  pravastatin  And LFTS are normal. No changes today    Lab Results  Component Value Date   CHOL 208 (H) 02/02/2018   HDL 78.90 02/02/2018   LDLCALC 115 (H) 02/02/2018   LDLDIRECT 137.0 06/05/2015   TRIG 71.0 02/02/2018   CHOLHDL 3 02/02/2018   Lab Results  Component Value Date   ALT 10 02/02/2018   AST 23 02/02/2018   ALKPHOS 49 02/02/2018   BILITOT 0.4 02/02/2018

## 2018-02-08 NOTE — Assessment & Plan Note (Signed)
She has no history of hypertension but has mild elevation today based on new guidelines   She has been asked to check her pressures at home and submit readings for evaluation. Renal function is normal .  Lab Results  Component Value Date   CREATININE 0.64 02/02/2018   Lab Results  Component Value Date   NA 138 02/02/2018   K 4.9 02/02/2018   CL 102 02/02/2018   CO2 28 02/02/2018

## 2018-02-09 ENCOUNTER — Ambulatory Visit (AMBULATORY_SURGERY_CENTER): Payer: BC Managed Care – PPO | Admitting: Gastroenterology

## 2018-02-09 ENCOUNTER — Encounter: Payer: Self-pay | Admitting: Gastroenterology

## 2018-02-09 VITALS — BP 121/74 | HR 64 | Temp 98.4°F | Resp 20 | Ht 62.0 in | Wt 130.0 lb

## 2018-02-09 DIAGNOSIS — D123 Benign neoplasm of transverse colon: Secondary | ICD-10-CM

## 2018-02-09 DIAGNOSIS — Z1211 Encounter for screening for malignant neoplasm of colon: Secondary | ICD-10-CM

## 2018-02-09 MED ORDER — SODIUM CHLORIDE 0.9 % IV SOLN: 500.0000 mL | Freq: Once | INTRAVENOUS | Status: AC

## 2018-02-09 NOTE — Op Note (Signed)
[facility] Patient Name: Michele Hess Procedure Date: 02/09/2018 12:09 PM MRN: [id number] Endoscopist: Remo Lipps P. Havery Moros , MD Age: 62 Referring MD:  Date of Birth: 01-05-1956 Gender: Female Account #: [id number] Procedure:                Colonoscopy Indications:              Screening for colorectal malignant neoplasm Medicines:                Monitored Anesthesia Care Procedure:                Pre-Anesthesia Assessment:                           - Prior to the procedure, a History and Physical                            was performed, and patient medications and                            allergies were reviewed. The patient's tolerance of                            previous anesthesia was also reviewed. The risks                            and benefits of the procedure and the sedation                            options and risks were discussed with the patient.                            All questions were answered, and informed consent                            was obtained. Prior Anticoagulants: The patient has                            taken no previous anticoagulant or antiplatelet                            agents. ASA Grade Assessment: II - A patient with                            mild systemic disease. After reviewing the risks                            and benefits, the patient was deemed in                            satisfactory condition to undergo the procedure.                           After obtaining informed consent, the colonoscope  was passed under direct vision. Throughout the                            procedure, the patient's blood pressure, pulse, and                            oxygen saturations were monitored continuously. The                            Colonoscope was introduced through the anus and                            advanced to the the cecum, identified by                            appendiceal orifice  and ileocecal valve. The                            colonoscopy was performed without difficulty. The                            patient tolerated the procedure well. The quality                            of the bowel preparation was good. The ileocecal                            valve, appendiceal orifice, and rectum were                            photographed. Scope In: 12:14:59 PM Scope Out: 12:36:53 PM Scope Withdrawal Time: 0 hours 14 minutes 45 seconds  Total Procedure Duration: 0 hours 21 minutes 54 seconds  Findings:                 Skin tags were found on perianal exam.                           Two flat and sessile polyps were found in the                            transverse colon. The polyps were 6 to 10 mm in                            size. These polyps were removed with a cold snare.                            Resection and retrieval were complete.                           Anal papilla(e) were hypertrophied.                           The exam was otherwise without abnormality. Complications:  No immediate complications. Estimated blood loss:                            Minimal. Estimated Blood Loss:     Estimated blood loss was minimal. Impression:               - Perianal skin tags found on perianal exam.                           - Two 6 to 10 mm polyps in the transverse colon,                            removed with a cold snare. Resected and retrieved.                           - Anal papilla(e) were hypertrophied.                           - The examination was otherwise normal. Recommendation:           - Patient has a contact number available for                            emergencies. The signs and symptoms of potential                            delayed complications were discussed with the                            patient. Return to normal activities tomorrow.                            Written discharge instructions were provided to the                             patient.                           - Resume previous diet.                           - Continue present medications.                           - Await pathology results.                           - Repeat colonoscopy for surveillance based on                            pathology results. Remo Lipps P. Armbruster, MD 02/09/2018 12:39:43 PM This report has been signed electronically.

## 2018-02-09 NOTE — Progress Notes (Signed)
Called to room to assist during endoscopic procedure.  Patient ID and intended procedure confirmed with present staff. Received instructions for my participation in the procedure from the performing physician.  

## 2018-02-09 NOTE — Progress Notes (Signed)
To PACU, VSS. Report to [email or web address] 

## 2018-02-09 NOTE — Patient Instructions (Signed)
YOU HAD AN ENDOSCOPIC PROCEDURE TODAY AT THE [facility]:   Refer to the procedure report that was given to you for any specific questions about what was found during the examination.  If the procedure report does not answer your questions, please call your gastroenterologist to clarify.  If you requested that your care partner not be given the details of your procedure findings, then the procedure report has been included in a sealed envelope for you to review at your convenience later.  YOU SHOULD EXPECT: Some feelings of bloating in the abdomen. Passage of more gas than usual.  Walking can help get rid of the air that was put into your GI tract during the procedure and reduce the bloating. If you had a lower endoscopy (such as a colonoscopy or flexible sigmoidoscopy) you may notice spotting of blood in your stool or on the toilet paper. If you underwent a bowel prep for your procedure, you may not have a normal bowel movement for a few days.  Please Note:  You might notice some irritation and congestion in your nose or some drainage.  This is from the oxygen used during your procedure.  There is no need for concern and it should clear up in a day or so.  SYMPTOMS TO REPORT IMMEDIATELY:   Following lower endoscopy (colonoscopy or flexible sigmoidoscopy):  Excessive amounts of blood in the stool  Significant tenderness or worsening of abdominal pains  Swelling of the abdomen that is new, acute  Fever of 100F or higher  Please see handouts given to you on Polyps.  For urgent or emergent issues, a gastroenterologist can be reached at any hour by calling [phone].   DIET:  We do recommend a small meal at first, but then you may proceed to your regular diet.  Drink plenty of fluids but you should avoid alcoholic beverages for 24 hours.  ACTIVITY:  You should plan to take it easy for the rest of today and you should NOT DRIVE or use heavy machinery until tomorrow (because of the  sedation medicines used during the test).    FOLLOW UP: Our staff will call the number listed on your records the next business day following your procedure to check on you and address any questions or concerns that you may have regarding the information given to you following your procedure. If we do not reach you, we will leave a message.  However, if you are feeling well and you are not experiencing any problems, there is no need to return our call.  We will assume that you have returned to your regular daily activities without incident.  If any biopsies were taken you will be contacted by phone or by letter within the next 1-3 weeks.  Please call us at [phone] if you have not heard about the biopsies in 3 weeks.    SIGNATURES/CONFIDENTIALITY: You and/or your care partner have signed paperwork which will be entered into your electronic medical record.  These signatures attest to the fact that that the information above on your After Visit Summary has been reviewed and is understood.  Full responsibility of the confidentiality of this discharge information lies with you and/or your care-partner.  Thank you for letting us take care of your healthcare needs today. 

## 2018-02-10 ENCOUNTER — Telehealth: Payer: Self-pay

## 2018-02-10 NOTE — Telephone Encounter (Signed)
Attempted to reach patient for post-procedure f/u call. No answer. Left message that we will make another attempt to reach her later today and for her to please not hesitate to call us if she has any questions/concerns regarding her care. 

## 2018-02-17 ENCOUNTER — Ambulatory Visit: Payer: BC Managed Care – PPO | Admitting: Internal Medicine

## 2018-03-23 ENCOUNTER — Other Ambulatory Visit: Payer: Self-pay | Admitting: Gastroenterology

## 2018-03-23 DIAGNOSIS — K219 Gastro-esophageal reflux disease without esophagitis: Secondary | ICD-10-CM

## 2018-03-23 MED ORDER — OMEPRAZOLE 20 MG PO CPDR: 20.0000 mg | DELAYED_RELEASE_CAPSULE | Freq: Every day | ORAL | 3 refills | Status: DC

## 2018-05-09 ENCOUNTER — Ambulatory Visit (INDEPENDENT_AMBULATORY_CARE_PROVIDER_SITE_OTHER): Payer: BC Managed Care – PPO

## 2018-05-09 DIAGNOSIS — Z23 Encounter for immunization: Secondary | ICD-10-CM

## 2018-06-08 ENCOUNTER — Other Ambulatory Visit: Payer: Self-pay | Admitting: Internal Medicine

## 2018-06-08 DIAGNOSIS — Z1231 Encounter for screening mammogram for malignant neoplasm of breast: Secondary | ICD-10-CM

## 2018-07-13 ENCOUNTER — Ambulatory Visit
Admission: RE | Admit: 2018-07-13 | Discharge: 2018-07-13 | Disposition: A | Discharge status: Home / Self Care | Payer: BC Managed Care – PPO | Source: Ambulatory Visit | Attending: Internal Medicine | Admitting: Internal Medicine

## 2018-07-13 DIAGNOSIS — Z1231 Encounter for screening mammogram for malignant neoplasm of breast: Secondary | ICD-10-CM | POA: Insufficient documentation

## 2018-08-01 ENCOUNTER — Other Ambulatory Visit: Payer: Self-pay | Admitting: Internal Medicine

## 2018-09-24 ENCOUNTER — Other Ambulatory Visit: Payer: Self-pay | Admitting: Internal Medicine

## 2018-09-24 DIAGNOSIS — E785 Hyperlipidemia, unspecified: Secondary | ICD-10-CM

## 2018-09-24 DIAGNOSIS — T783XXS Angioneurotic edema, sequela: Secondary | ICD-10-CM

## 2018-10-04 ENCOUNTER — Other Ambulatory Visit (INDEPENDENT_AMBULATORY_CARE_PROVIDER_SITE_OTHER): Payer: BC Managed Care – PPO

## 2018-10-04 DIAGNOSIS — T783XXS Angioneurotic edema, sequela: Secondary | ICD-10-CM

## 2018-10-04 DIAGNOSIS — E785 Hyperlipidemia, unspecified: Secondary | ICD-10-CM | POA: Diagnosis not present

## 2018-10-04 LAB — COMPREHENSIVE METABOLIC PANEL
ALT: 8 U/L (ref 0–35)
AST: 20 U/L (ref 0–37)
Albumin: 4.7 g/dL (ref 3.5–5.2)
Alkaline Phosphatase: 51 U/L (ref 39–117)
BILIRUBIN TOTAL: 0.7 mg/dL (ref 0.2–1.2)
BUN: 13 mg/dL (ref 6–23)
CALCIUM: 10.1 mg/dL (ref 8.4–10.5)
CO2: 28 meq/L (ref 19–32)
CREATININE: 0.63 mg/dL (ref 0.40–1.20)
Chloride: 100 mEq/L (ref 96–112)
GFR: 95.82 mL/min (ref >60.00)
GLUCOSE: 97 mg/dL (ref 70–99)
Potassium: 4.9 mEq/L (ref 3.5–5.1)
Sodium: 136 mEq/L (ref 135–145)
Total Protein: 7.3 g/dL (ref 6.0–8.3)

## 2018-10-04 LAB — CBC WITH DIFFERENTIAL/PLATELET
BASOS ABS: 0 10*3/uL (ref 0.0–0.1)
Basophils Relative: 0.4 % (ref 0.0–3.0)
EOS ABS: 0.1 10*3/uL (ref 0.0–0.7)
Eosinophils Relative: 1.2 % (ref 0.0–5.0)
HCT: 42 % (ref 36.0–46.0)
Hemoglobin: 13.6 g/dL (ref 12.0–15.0)
Lymphocytes Relative: 42.9 % (ref 12.0–46.0)
Lymphs Abs: 2.2 10*3/uL (ref 0.7–4.0)
MCHC: 32.3 g/dL (ref 30.0–36.0)
MCV: 89.9 fl (ref 78.0–100.0)
Monocytes Absolute: 0.5 10*3/uL (ref 0.1–1.0)
Monocytes Relative: 8.8 % (ref 3.0–12.0)
NEUTROS ABS: 2.4 10*3/uL (ref 1.4–7.7)
NEUTROS PCT: 46.7 % (ref 43.0–77.0)
PLATELETS: 296 10*3/uL (ref 150.0–400.0)
RBC: 4.67 Mil/uL (ref 3.87–5.11)
RDW: 13.4 % (ref 11.5–15.5)
WBC: 5.2 10*3/uL (ref 4.0–10.5)

## 2018-10-04 LAB — TSH: TSH: 1.52 u[IU]/mL (ref 0.35–4.50)

## 2018-10-04 LAB — LIPID PANEL
CHOL/HDL RATIO: 2
Cholesterol: 197 mg/dL (ref 0–200)
HDL: 82.5 mg/dL (ref >39.00)
LDL Cholesterol: 101 mg/dL — ABNORMAL HIGH (ref 0–99)
NONHDL: 114.63
TRIGLYCERIDES: 69 mg/dL (ref 0.0–149.0)
VLDL: 13.8 mg/dL (ref 0.0–40.0)

## 2018-10-05 ENCOUNTER — Other Ambulatory Visit: Payer: BC Managed Care – PPO

## 2018-10-07 ENCOUNTER — Ambulatory Visit (INDEPENDENT_AMBULATORY_CARE_PROVIDER_SITE_OTHER): Payer: BC Managed Care – PPO | Admitting: Internal Medicine

## 2018-10-07 ENCOUNTER — Encounter: Payer: Self-pay | Admitting: Internal Medicine

## 2018-10-07 ENCOUNTER — Other Ambulatory Visit (HOSPITAL_COMMUNITY)
Admission: RE | Admit: 2018-10-07 | Discharge: 2018-10-07 | Disposition: A | Discharge status: Home / Self Care | Payer: BC Managed Care – PPO | Source: Ambulatory Visit | Attending: Internal Medicine | Admitting: Internal Medicine

## 2018-10-07 VITALS — BP 132/90 | HR 62 | Temp 97.6°F | Resp 14 | Ht 62.0 in | Wt 133.0 lb

## 2018-10-07 DIAGNOSIS — N959 Unspecified menopausal and perimenopausal disorder: Secondary | ICD-10-CM | POA: Diagnosis not present

## 2018-10-07 DIAGNOSIS — Z1151 Encounter for screening for human papillomavirus (HPV): Secondary | ICD-10-CM | POA: Insufficient documentation

## 2018-10-07 DIAGNOSIS — Z0000 Encounter for general adult medical examination without abnormal findings: Secondary | ICD-10-CM

## 2018-10-07 DIAGNOSIS — Z124 Encounter for screening for malignant neoplasm of cervix: Secondary | ICD-10-CM | POA: Diagnosis not present

## 2018-10-07 DIAGNOSIS — Z90710 Acquired absence of both cervix and uterus: Secondary | ICD-10-CM | POA: Diagnosis not present

## 2018-10-07 DIAGNOSIS — E785 Hyperlipidemia, unspecified: Secondary | ICD-10-CM

## 2018-10-07 DIAGNOSIS — R030 Elevated blood-pressure reading, without diagnosis of hypertension: Secondary | ICD-10-CM | POA: Diagnosis not present

## 2018-10-07 DIAGNOSIS — Z23 Encounter for immunization: Secondary | ICD-10-CM

## 2018-10-07 MED ORDER — ZOSTER VAC RECOMB ADJUVANTED 50 MCG/0.5ML IM SUSR
0.5000 mL | Freq: Once | INTRAMUSCULAR | 1 refills | Status: AC
Start: 2018-10-07 — End: 2018-10-07

## 2018-10-07 NOTE — Patient Instructions (Addendum)
Try taking magnesium 410m and 100 mg colace for your constipation; if too strong,  You can also can try using  citrucel instead of colace.   [facility] [place] [facility] hormone therapy .  Your cholesterol, thyroid, liver and kidney function are excellent!  You do not need any medication changes. Please plan to repeat the labs in 6 months.    Health Maintenance for Postmenopausal Women Menopause is a normal process in which your reproductive ability comes to an end. This process happens gradually over a span of months to years, usually between the ages of 426and 53 Menopause is complete when you have missed 12 consecutive menstrual periods. It is important to talk with your health care provider about some of the most common conditions that affect postmenopausal women, such as heart disease, cancer, and bone loss (osteoporosis). Adopting a healthy lifestyle and getting preventive care can help to promote your health and wellness. Those actions can also lower your chances of developing some of these common conditions. What should I know about menopause? During menopause, you may experience a number of symptoms, such as:  Moderate-to-severe hot flashes.  Night sweats.  Decrease in sex drive.  Mood swings.  Headaches.  Tiredness.  Irritability.  Memory problems.  Insomnia. Choosing to treat or not to treat menopausal changes is an individual decision that you make with your health care provider. What should I know about hormone replacement therapy and supplements? Hormone therapy products are effective for treating symptoms that are associated with menopause, such as hot flashes and night sweats. Hormone replacement carries certain risks, especially as you become older. If you are thinking about using estrogen or estrogen with progestin treatments, discuss the benefits and risks with your health care provider. What should I know about heart  disease and stroke? Heart disease, heart attack, and stroke become more likely as you age. This may be due, in part, to the hormonal changes that your body experiences during menopause. These can affect how your body processes dietary fats, triglycerides, and cholesterol. Heart attack and stroke are both medical emergencies. There are many things that you can do to help prevent heart disease and stroke:  Have your blood pressure checked at least every 1-2 years. High blood pressure causes heart disease and increases the risk of stroke.  If you are [age]years old, ask your health care provider if you should take aspirin to prevent a heart attack or a stroke.  Do not use any tobacco products, including cigarettes, chewing tobacco, or electronic cigarettes. If you need help quitting, ask your health care provider.  It is important to eat a healthy diet and maintain a healthy weight. ? Be sure to include plenty of vegetables, fruits, low-fat dairy products, and lean protein. ? Avoid eating foods that are high in solid fats, added sugars, or salt (sodium).  Get regular exercise. This is one of the most important things that you can do for your health. ? Try to exercise for at least 150 minutes each week. The type of exercise that you do should increase your heart rate and make you sweat. This is known as moderate-intensity exercise. ? Try to do strengthening exercises at least twice each week. Do these in addition to the moderate-intensity exercise.  Know your numbers.Ask your health care provider to check your cholesterol and your blood glucose. Continue to have your blood tested as directed by your health care provider.  What should I  know about cancer screening? There are several types of cancer. Take the following steps to reduce your risk and to catch any cancer development as early as possible. Breast Cancer  Practice breast self-awareness. ? This means understanding how your breasts  normally appear and feel. ? It also means doing regular breast self-exams. Let your health care provider know about any changes, no matter how small.  If you are 34 or older, have a clinician do a breast exam (clinical breast exam or CBE) every year. Depending on your age, family history, and medical history, it may be recommended that you also have a yearly breast X-ray (mammogram).  If you have a family history of breast cancer, talk with your health care provider about genetic screening.  If you are at high risk for breast cancer, talk with your health care provider about having an MRI and a mammogram every year.  Breast cancer (BRCA) gene test is recommended for women who have family members with BRCA-related cancers. Results of the assessment will determine the need for genetic counseling and BRCA1 and for BRCA2 testing. BRCA-related cancers include these types: ? Breast. This occurs in males or females. ? Ovarian. ? Tubal. This may also be called fallopian tube cancer. ? Cancer of the abdominal or pelvic lining (peritoneal cancer). ? Prostate. ? Pancreatic. Cervical, Uterine, and Ovarian Cancer Your health care provider may recommend that you be screened regularly for cancer of the pelvic organs. These include your ovaries, uterus, and vagina. This screening involves a pelvic exam, which includes checking for microscopic changes to the surface of your cervix (Pap test).  For women ages 21-65, health care providers may recommend a pelvic exam and a Pap test every three years. For women ages 38-65, they may recommend the Pap test and pelvic exam, combined with testing for human papilloma virus (HPV), every five years. Some types of HPV increase your risk of cervical cancer. Testing for HPV may also be done on women of any age who have unclear Pap test results.  Other health care providers may not recommend any screening for nonpregnant women who are considered low risk for pelvic cancer and  have no symptoms. Ask your health care provider if a screening pelvic exam is right for you.  If you have had past treatment for cervical cancer or a condition that could lead to cancer, you need Pap tests and screening for cancer for at least 20 years after your treatment. If Pap tests have been discontinued for you, your risk factors (such as having a new sexual partner) need to be reassessed to determine if you should start having screenings again. Some women have medical problems that increase the chance of getting cervical cancer. In these cases, your health care provider may recommend that you have screening and Pap tests more often.  If you have a family history of uterine cancer or ovarian cancer, talk with your health care provider about genetic screening.  If you have vaginal bleeding after reaching menopause, tell your health care provider.  There are currently no reliable tests available to screen for ovarian cancer. Lung Cancer Lung cancer screening is recommended for adults 46-93 years old who are at high risk for lung cancer because of a history of smoking. A yearly low-dose CT scan of the lungs is recommended if you:  Currently smoke.  Have a history of at least 30 pack-years of smoking and you currently smoke or have quit within the past 15 years. A pack-year is  smoking an average of one pack of cigarettes per day for one year. Yearly screening should:  Continue until it has been 15 years since you quit.  Stop if you develop a health problem that would prevent you from having lung cancer treatment. Colorectal Cancer  This type of cancer can be detected and can often be prevented.  Routine colorectal cancer screening usually begins at age 68 and continues through age 68.  If you have risk factors for colon cancer, your health care provider may recommend that you be screened at an earlier age.  If you have a family history of colorectal cancer, talk with your health care  provider about genetic screening.  Your health care provider may also recommend using home test kits to check for hidden blood in your stool.  A small camera at the end of a tube can be used to examine your colon directly (sigmoidoscopy or colonoscopy). This is done to check for the earliest forms of colorectal cancer.  Direct examination of the colon should be repeated every 5-10 years until age 70. However, if early forms of precancerous polyps or small growths are found or if you have a family history or genetic risk for colorectal cancer, you may need to be screened more often. Skin Cancer  Check your skin from head to toe regularly.  Monitor any moles. Be sure to tell your health care provider: ? About any new moles or changes in moles, especially if there is a change in a mole's shape or color. ? If you have a mole that is larger than the size of a pencil eraser.  If any of your family members has a history of skin cancer, especially at a young age, talk with your health care provider about genetic screening.  Always use sunscreen. Apply sunscreen liberally and repeatedly throughout the day.  Whenever you are outside, protect yourself by wearing long sleeves, pants, a wide-brimmed hat, and sunglasses. What should I know about osteoporosis? Osteoporosis is a condition in which bone destruction happens more quickly than new bone creation. After menopause, you may be at an increased risk for osteoporosis. To help prevent osteoporosis or the bone fractures that can happen because of osteoporosis, the following is recommended:  If you are 28-21 years old, get at least 1,000 mg of calcium and at least 600 mg of vitamin D per day.  If you are older than age 66 but younger than age 61, get at least 1,200 mg of calcium and at least 600 mg of vitamin D per day.  If you are older than age 65, get at least 1,200 mg of calcium and at least 800 mg of vitamin D per day. Smoking and excessive  alcohol intake increase the risk of osteoporosis. Eat foods that are rich in calcium and vitamin D, and do weight-bearing exercises several times each week as directed by your health care provider. What should I know about how menopause affects my mental health? Depression may occur at any age, but it is more common as you become older. Common symptoms of depression include:  Low or sad mood.  Changes in sleep patterns.  Changes in appetite or eating patterns.  Feeling an overall lack of motivation or enjoyment of activities that you previously enjoyed.  Frequent crying spells. Talk with your health care provider if you think that you are experiencing depression. What should I know about immunizations? It is important that you get and maintain your immunizations. These include:  Tetanus,  diphtheria, and pertussis (Tdap) booster vaccine.  Influenza every year before the flu season begins.  Pneumonia vaccine.  Shingles vaccine. Your health care provider may also recommend other immunizations. This information is not intended to replace advice given to you by your health care provider. Make sure you discuss any questions you have with your health care provider. Document Released: 09/11/2005 Document Revised: 02/07/2016 Document Reviewed: 04/23/2015 Elsevier Interactive Patient Education  2019 [employer].

## 2018-10-07 NOTE — Progress Notes (Signed)
8

## 2018-10-07 NOTE — Progress Notes (Signed)
Patient ID: Michele Hess, female    DOB: 09/18/1955  Age: 63 y.o. MRN: [id number]  The patient is here for annual preventive  examination and management of other chronic and acute problems.  colonoscopy July 2019 3 yr follow up needed  Skin exam by tara stewart annually Eye exam  annually  Mammogram annually PAP smear due    The risk factors are reflected in the social history.  The roster of all physicians providing medical care to patient - is listed in the Snapshot section of the chart.  Activities of daily living:  The patient is 100% independent in all ADLs: dressing, toileting, feeding as well as independent mobility  Home safety : The patient has smoke detectors in the home. They wear seatbelts.  There are no firearms at home. There is no violence in the home.   There is no risks for hepatitis, STDs or HIV. There is no   history of blood transfusion. They have no travel history to infectious disease endemic areas of the world.  The patient has seen their dentist in the last six month. They have seen their eye doctor in the last year. She denies  hearing difficulty with regard to whispered voices and some television programs.  They have deferred audiologic testing in the last year.  They do not  have excessive sun exposure. Discussed the need for sun protection: hats, long sleeves and use of sunscreen if there is significant sun exposure.   Diet: the importance of a healthy diet is discussed. They do have a healthy diet.  The benefits of regular aerobic exercise were discussed. She runs 5 days per week ,  60 minutes and ran a half marathon recently..   Depression screen: there are no signs or vegative symptoms of depression- irritability, change in appetite, anhedonia, sadness/tearfullness.   The following portions of the patient's history were reviewed and updated as appropriate: allergies, current medications, past family history, past medical history,  past surgical history,  past social history  and problem list.  Visual acuity was not assessed per patient preference since she has regular follow up with her ophthalmologist. Hearing and body mass index were assessed and reviewed.   During the course of the visit the patient was educated and counseled about appropriate screening and preventive services including : fall prevention , diabetes screening, nutrition counseling, colorectal cancer screening, and recommended immunizations.    CC: The primary encounter diagnosis was Cervical cancer screening. Diagnoses of Need for vaccine for Td (tetanus-diphtheria), Menopausal and postmenopausal disorder, White coat syndrome with high blood pressure but without hypertension, Encounter for preventive health examination, and Hyperlipidemia with target LDL less than 100 were also pertinent to this visit.  Vaginal dryness ,  Wants to start estrogen /progesterone vaginal  Cream  Had prolonged period of left hip pain after running a half marathon.  Now improved.  Chronic recurrence with long distance running   Wants to use [facility]    Needs Tetanus vaccine  History Michele Hess has a past medical history of Allergy, GERD (gastroesophageal reflux disease), History of ETT, HLD (hyperlipidemia), Menopausal and postmenopausal disorder, Seizure ([place]) (01/2016), and Syncope and collapse (01/27/2016).   She has a past surgical history that includes Partial hysterectomy; echo (other) (9/10); Total abdominal hysterectomy (1993); Anterior cruciate ligament repair (Left, 2011); Tonsillectomy (1961); and Colonoscopy.   Her family history includes Colon polyps in her father; Diabetes in her brother and mother; Heart attack (age of onset: 73) in her mother; Heart  attack (age of onset: 63) in her father; Lung cancer in her brother.She reports that she quit smoking about 31 years ago. Her smoking use included cigarettes. She has a 17.00 pack-year smoking history. She has never used smokeless tobacco.  She reports current alcohol use of about 3.0 standard drinks of alcohol per week. She reports that she does not use drugs.  Outpatient Medications Prior to Visit  Medication Sig Dispense Refill  . calcium carbonate (CALCIUM 600) 600 MG TABS tablet Take 1,200 mg by mouth daily with breakfast.    . Multiple Vitamin (MULTIVITAMIN) tablet Take 1 tablet by mouth daily.    . Multiple Vitamins-Minerals (EMERGEN-C VITAMIN C) PACK Take 1 packet by mouth daily.    . Omega-3 Fatty Acids (FISH OIL) 1000 MG CAPS Take by mouth 2 (two) times daily.    Marland Kitchen omeprazole (PRILOSEC) 20 MG capsule Take 1 capsule (20 mg total) by mouth daily. 90 capsule 3  . pravastatin (PRAVACHOL) 20 MG tablet TAKE 1 TABLET BY MOUTH EVERY DAY IN THE EVENING 90 tablet 1  . ranitidine (ZANTAC) 150 MG tablet Take 1 tablet (150 mg total) by mouth 2 (two) times daily. May take 1-2 times a day. (Patient not taking: Reported on 02/09/2018) 60 tablet 2   Facility-Administered Medications Prior to Visit  Medication Dose Route Frequency Provider Last Rate Last Dose  . 0.9 %  sodium chloride infusion  500 mL Intravenous Once Armbruster, Carlota Raspberry, MD        Review of Systems  Patient denies headache, fevers, malaise, unintentional weight loss, skin rash, eye pain, sinus congestion and sinus pain, sore throat, dysphagia,  hemoptysis , cough, dyspnea, wheezing, chest pain, palpitations, orthopnea, edema, abdominal pain, nausea, melena, diarrhea, constipation, flank pain, dysuria, hematuria, urinary  Frequency, nocturia, numbness, tingling, seizures,  Focal weakness, Loss of consciousness,  Tremor, insomnia, depression, anxiety, and suicidal ideation.    Objective:  BP 132/90 (BP Location: Left Arm, Patient Position: Sitting, Cuff Size: Normal)   Pulse 62   Temp 97.6 F (36.4 C) (Oral)   Resp 14   Ht 5\' 2"  (1.575 m)   Wt 133 lb (60.3 kg)   SpO2 99%   BMI 24.33 kg/m   Physical Exam  General Appearance:    Alert, cooperative, no distress,  appears stated age  Head:    Normocephalic, without obvious abnormality, atraumatic  Eyes:    PERRL, conjunctiva/corneas clear, EOM's intact, fundi    benign, both eyes  Ears:    Normal TM's and external ear canals, both ears  Nose:   Nares normal, septum midline, mucosa normal, no drainage    or sinus tenderness  Throat:   Lips, mucosa, and tongue normal; teeth and gums normal  Neck:   Supple, symmetrical, trachea midline, no adenopathy;    thyroid:  no enlargement/tenderness/nodules; no carotid   bruit or JVD  Back:     Symmetric, no curvature, ROM normal, no CVA tenderness  Lungs:     Clear to auscultation bilaterally, respirations unlabored  Chest Wall:    No tenderness or deformity   Heart:    Regular rate and rhythm, S1 and S2 normal, no murmur, rub   or gallop  Breast Exam:    No tenderness, masses, or nipple abnormality  Abdomen:     Soft, non-tender, bowel sounds active all four quadrants,    no masses, no organomegaly  Genitalia:    Pelvic: cervix normal in appearance, external genitalia normal, no adnexal masses or tenderness,  no cervical motion tenderness, rectovaginal septum normal, uterus normal size, shape, and consistency and vagina normal without discharge  Extremities:   Extremities normal, atraumatic, no cyanosis or edema  Pulses:   2+ and symmetric all extremities  Skin:   Skin color, texture, turgor normal, no rashes or lesions  Lymph nodes:   Cervical, supraclavicular, and axillary nodes normal  Neurologic:   CNII-XII intact, normal strength, sensation and reflexes    throughout     Assessment & Plan:   Problem List Items Addressed This Visit    Hyperlipidemia with target LDL less than 100    Lipids at  goal on  pravastatin  And LFTS are normal. No changes today    Lab Results  Component Value Date   CHOL 197 10/04/2018   HDL 82.50 10/04/2018   LDLCALC 101 (H) 10/04/2018   LDLDIRECT 137.0 06/05/2015   TRIG 69.0 10/04/2018   CHOLHDL 2 10/04/2018   Lab  Results  Component Value Date   ALT 8 10/04/2018   AST 20 10/04/2018   ALKPHOS 51 10/04/2018   BILITOT 0.7 10/04/2018         Menopausal and postmenopausal disorder   Encounter for preventive health examination    age appropriate education and counseling updated, referrals for preventative services and immunizations addressed, dietary and smoking counseling addressed, most recent labs reviewed.  I have personally reviewed and have noted:  1) the patient's medical and social history 2) The pt's use of alcohol, tobacco, and illicit drugs 3) The patient's current medications and supplements 4) Functional ability including ADL's, fall risk, home safety risk, hearing and visual impairment 5) Diet and physical activities 6) Evidence for depression or mood disorder 7) The patient's height, weight, and BMI have been recorded in the chart  I have made referrals, and provided counseling and education based on review of the above      White coat syndrome with high blood pressure but without hypertension    She has no history of hypertension but has had  mild elevations in the office.   She haso checked  her pressures at home and reading have been 120/70 or less .  Renal function is normal .  Lab Results  Component Value Date   CREATININE 0.63 10/04/2018   Lab Results  Component Value Date   NA 136 10/04/2018   K 4.9 10/04/2018   CL 100 10/04/2018   CO2 28 10/04/2018          Other Visit Diagnoses    Cervical cancer screening    -  Primary   Relevant Orders   Cytology - PAP( [place])   Need for vaccine for Td (tetanus-diphtheria)       Relevant Orders   Td : Tetanus/diphtheria >7yo Preservative  free (Completed)      I have discontinued Keirston L. Colgan's ranitidine. I am also having her start on Zoster Vaccine Adjuvanted. Additionally, I am having her maintain her Fish Oil, calcium carbonate, multivitamin, Emergen-C Vitamin C, omeprazole, and pravastatin. We will  continue to administer sodium chloride.  Meds ordered this encounter  Medications  . Zoster Vaccine Adjuvanted [facility]) injection    Sig: Inject 0.5 mLs into the muscle once for 1 dose.    Dispense:  1 each    Refill:  1    Medications Discontinued During This Encounter  Medication Reason  . ranitidine (ZANTAC) 150 MG tablet Change in therapy    Follow-up: No follow-ups on file.   Helene Kelp  Ether Griffins, MD

## 2018-10-09 NOTE — Assessment & Plan Note (Signed)

## 2018-10-09 NOTE — Assessment & Plan Note (Signed)
Lipids at  goal on  pravastatin  And LFTS are normal. No changes today    Lab Results  Component Value Date   CHOL 197 10/04/2018   HDL 82.50 10/04/2018   LDLCALC 101 (H) 10/04/2018   LDLDIRECT 137.0 06/05/2015   TRIG 69.0 10/04/2018   CHOLHDL 2 10/04/2018   Lab Results  Component Value Date   ALT 8 10/04/2018   AST 20 10/04/2018   ALKPHOS 51 10/04/2018   BILITOT 0.7 10/04/2018

## 2018-10-09 NOTE — Assessment & Plan Note (Signed)
She has no history of hypertension but has had  mild elevations in the office.   She haso checked  her pressures at home and reading have been 120/70 or less .  Renal function is normal .  Lab Results  Component Value Date   CREATININE 0.63 10/04/2018   Lab Results  Component Value Date   NA 136 10/04/2018   K 4.9 10/04/2018   CL 100 10/04/2018   CO2 28 10/04/2018

## 2018-10-11 LAB — CYTOLOGY - PAP
Diagnosis: NEGATIVE
HPV: NOT DETECTED

## 2019-01-18 ENCOUNTER — Telehealth: Payer: Self-pay

## 2019-01-18 NOTE — Telephone Encounter (Signed)
It has to be a hand written rx and then faxed to [facility].

## 2019-01-18 NOTE — Telephone Encounter (Signed)
Received a refill request for Progesterone 2% versa and Estriol 1mg  vaginal tablet  Refilled: 10/07/2018 Last OV: 10/07/2018 Next OV: not scheduled

## 2019-01-18 NOTE — Telephone Encounter (Signed)
Ok to refill for one year  

## 2019-01-19 NOTE — Telephone Encounter (Signed)
PLEASE PRINT OUT  THE PREVIOSU ONE

## 2019-01-19 NOTE — Telephone Encounter (Signed)
Written per Dr. Derrel Nip verbal order and placed on desk for signature.

## 2019-02-03 ENCOUNTER — Other Ambulatory Visit: Payer: Self-pay | Admitting: Internal Medicine

## 2019-04-04 ENCOUNTER — Telehealth: Payer: Self-pay | Admitting: *Deleted

## 2019-04-04 DIAGNOSIS — E785 Hyperlipidemia, unspecified: Secondary | ICD-10-CM

## 2019-04-04 DIAGNOSIS — R7301 Impaired fasting glucose: Secondary | ICD-10-CM

## 2019-04-04 DIAGNOSIS — R030 Elevated blood-pressure reading, without diagnosis of hypertension: Secondary | ICD-10-CM

## 2019-04-04 NOTE — Telephone Encounter (Signed)
Please place future orders for lab appt.  

## 2019-04-05 ENCOUNTER — Other Ambulatory Visit (INDEPENDENT_AMBULATORY_CARE_PROVIDER_SITE_OTHER): Payer: BC Managed Care – PPO

## 2019-04-05 ENCOUNTER — Other Ambulatory Visit: Payer: Self-pay

## 2019-04-05 ENCOUNTER — Ambulatory Visit: Payer: BC Managed Care – PPO

## 2019-04-05 DIAGNOSIS — Z23 Encounter for immunization: Secondary | ICD-10-CM

## 2019-04-05 DIAGNOSIS — R030 Elevated blood-pressure reading, without diagnosis of hypertension: Secondary | ICD-10-CM | POA: Diagnosis not present

## 2019-04-05 DIAGNOSIS — R7301 Impaired fasting glucose: Secondary | ICD-10-CM | POA: Diagnosis not present

## 2019-04-05 DIAGNOSIS — E785 Hyperlipidemia, unspecified: Secondary | ICD-10-CM

## 2019-04-05 LAB — LIPID PANEL
Cholesterol: 216 mg/dL — ABNORMAL HIGH (ref 0–200)
HDL: 80 mg/dL (ref >39.00)
LDL Cholesterol: 115 mg/dL — ABNORMAL HIGH (ref 0–99)
NonHDL: 136.23
Total CHOL/HDL Ratio: 3
Triglycerides: 105 mg/dL (ref 0.0–149.0)
VLDL: 21 mg/dL (ref 0.0–40.0)

## 2019-04-05 LAB — COMPREHENSIVE METABOLIC PANEL
ALT: 9 U/L (ref 0–35)
AST: 19 U/L (ref 0–37)
Albumin: 4.8 g/dL (ref 3.5–5.2)
Alkaline Phosphatase: 50 U/L (ref 39–117)
BUN: 19 mg/dL (ref 6–23)
CO2: 28 mEq/L (ref 19–32)
Calcium: 9.8 mg/dL (ref 8.4–10.5)
Chloride: 101 mEq/L (ref 96–112)
Creatinine, Ser: 0.74 mg/dL (ref 0.40–1.20)
GFR: 79.45 mL/min (ref >60.00)
Glucose, Bld: 108 mg/dL — ABNORMAL HIGH (ref 70–99)
Potassium: 4.1 mEq/L (ref 3.5–5.1)
Sodium: 139 mEq/L (ref 135–145)
Total Bilirubin: 0.6 mg/dL (ref 0.2–1.2)
Total Protein: 7.6 g/dL (ref 6.0–8.3)

## 2019-04-05 LAB — HEMOGLOBIN A1C: Hgb A1c MFr Bld: 5.9 % (ref 4.6–6.5)

## 2019-06-08 ENCOUNTER — Other Ambulatory Visit: Payer: Self-pay | Admitting: Internal Medicine

## 2019-06-08 DIAGNOSIS — Z1231 Encounter for screening mammogram for malignant neoplasm of breast: Secondary | ICD-10-CM

## 2019-07-20 ENCOUNTER — Ambulatory Visit
Admission: RE | Admit: 2019-07-20 | Discharge: 2019-07-20 | Disposition: A | Discharge status: Home / Self Care | Payer: BC Managed Care – PPO | Source: Ambulatory Visit | Attending: Internal Medicine | Admitting: Internal Medicine

## 2019-07-20 DIAGNOSIS — Z1231 Encounter for screening mammogram for malignant neoplasm of breast: Secondary | ICD-10-CM

## 2019-08-22 ENCOUNTER — Other Ambulatory Visit: Payer: Self-pay | Admitting: Internal Medicine

## 2019-10-02 DIAGNOSIS — Z0000 Encounter for general adult medical examination without abnormal findings: Secondary | ICD-10-CM

## 2019-10-05 NOTE — Telephone Encounter (Signed)
Spoke with pt on the phone and scheduled her for both a follow up with Dr. Derrel Nip and a fasting lab appt.   I have ordered A1c, cmp, lipid, tsh, and cbc. Is there anything else that needs to be ordered for her lab appt?

## 2019-10-27 ENCOUNTER — Other Ambulatory Visit: Payer: Self-pay

## 2019-10-27 ENCOUNTER — Other Ambulatory Visit (INDEPENDENT_AMBULATORY_CARE_PROVIDER_SITE_OTHER): Payer: BC Managed Care – PPO

## 2019-10-27 DIAGNOSIS — Z0000 Encounter for general adult medical examination without abnormal findings: Secondary | ICD-10-CM | POA: Diagnosis not present

## 2019-10-27 LAB — HEMOGLOBIN A1C: Hgb A1c MFr Bld: 5.7 % (ref 4.6–6.5)

## 2019-10-27 LAB — COMPREHENSIVE METABOLIC PANEL
ALT: 9 U/L (ref 0–35)
AST: 22 U/L (ref 0–37)
Albumin: 4.6 g/dL (ref 3.5–5.2)
Alkaline Phosphatase: 51 U/L (ref 39–117)
BUN: 14 mg/dL (ref 6–23)
CO2: 29 mEq/L (ref 19–32)
Calcium: 9.6 mg/dL (ref 8.4–10.5)
Chloride: 100 mEq/L (ref 96–112)
Creatinine, Ser: 0.69 mg/dL (ref 0.40–1.20)
GFR: 85.98 mL/min (ref >60.00)
Glucose, Bld: 101 mg/dL — ABNORMAL HIGH (ref 70–99)
Potassium: 4.1 mEq/L (ref 3.5–5.1)
Sodium: 135 mEq/L (ref 135–145)
Total Bilirubin: 0.6 mg/dL (ref 0.2–1.2)
Total Protein: 7.3 g/dL (ref 6.0–8.3)

## 2019-10-27 LAB — CBC WITH DIFFERENTIAL/PLATELET
Basophils Absolute: 0 10*3/uL (ref 0.0–0.1)
Basophils Relative: 0.7 % (ref 0.0–3.0)
Eosinophils Absolute: 0.1 10*3/uL (ref 0.0–0.7)
Eosinophils Relative: 2.3 % (ref 0.0–5.0)
HCT: 39.7 % (ref 36.0–46.0)
Hemoglobin: 13.3 g/dL (ref 12.0–15.0)
Lymphocytes Relative: 43.6 % (ref 12.0–46.0)
Lymphs Abs: 2.3 10*3/uL (ref 0.7–4.0)
MCHC: 33.5 g/dL (ref 30.0–36.0)
MCV: 90.5 fl (ref 78.0–100.0)
Monocytes Absolute: 0.6 10*3/uL (ref 0.1–1.0)
Monocytes Relative: 10.6 % (ref 3.0–12.0)
Neutro Abs: 2.3 10*3/uL (ref 1.4–7.7)
Neutrophils Relative %: 42.8 % — ABNORMAL LOW (ref 43.0–77.0)
Platelets: 308 10*3/uL (ref 150.0–400.0)
RBC: 4.39 Mil/uL (ref 3.87–5.11)
RDW: 13.8 % (ref 11.5–15.5)
WBC: 5.3 10*3/uL (ref 4.0–10.5)

## 2019-10-27 LAB — LIPID PANEL
Cholesterol: 215 mg/dL — ABNORMAL HIGH (ref 0–200)
HDL: 78.2 mg/dL (ref >39.00)
LDL Cholesterol: 117 mg/dL — ABNORMAL HIGH (ref 0–99)
NonHDL: 136.52
Total CHOL/HDL Ratio: 3
Triglycerides: 100 mg/dL (ref 0.0–149.0)
VLDL: 20 mg/dL (ref 0.0–40.0)

## 2019-10-27 LAB — TSH: TSH: 3.21 u[IU]/mL (ref 0.35–4.50)

## 2019-10-30 ENCOUNTER — Encounter: Payer: Self-pay | Admitting: Internal Medicine

## 2019-10-30 ENCOUNTER — Ambulatory Visit: Payer: BC Managed Care – PPO | Admitting: Internal Medicine

## 2019-10-30 ENCOUNTER — Other Ambulatory Visit: Payer: Self-pay

## 2019-10-30 DIAGNOSIS — Z0000 Encounter for general adult medical examination without abnormal findings: Secondary | ICD-10-CM

## 2019-10-30 DIAGNOSIS — R030 Elevated blood-pressure reading, without diagnosis of hypertension: Secondary | ICD-10-CM

## 2019-10-30 DIAGNOSIS — E785 Hyperlipidemia, unspecified: Secondary | ICD-10-CM | POA: Diagnosis not present

## 2019-10-30 NOTE — Progress Notes (Signed)
Patient ID: Michele Hess, female    DOB: [birth year]  Age: 64 y.o. MRN: [id number]  The patient is here for annual preventive examination and management of other chronic and acute problems.   The risk factors are reflected in the social history.  The roster of all physicians providing medical care to patient - is listed in the Snapshot section of the chart.  Activities of daily living:  The patient is 100% independent in all ADLs: dressing, toileting, feeding as well as independent mobility  Home safety : The patient has smoke detectors in the home. They wear seatbelts.  There are no firearms at home. There is no violence in the home.   There is no risks for hepatitis, STDs or HIV. There is no   history of blood transfusion. They have no travel history to infectious disease endemic areas of the world.  The patient has seen their dentist in the last six month. They have seen their eye doctor in the last year. They admit to slight hearing difficulty with regard to whispered voices and some television programs.  They have deferred audiologic testing in the last year.  They do not  have excessive sun exposure. Discussed the need for sun protection: hats, long sleeves and use of sunscreen if there is significant sun exposure.   Diet: the importance of a healthy diet is discussed. They do have a healthy diet.  The benefits of regular aerobic exercise were discussed. She exercises 5 times per week ,  45 minutes.   Depression screen: there are no signs or vegative symptoms of depression- irritability, change in appetite, anhedonia, sadness/tearfullness.  The following portions of the patient's history were reviewed and updated as appropriate: allergies, current medications, past family history, past medical history,  past surgical history, past social history  and problem list.  Visual acuity was not assessed per patient preference since she has regular follow up with her ophthalmologist. Hearing and  body mass index were assessed and reviewed.   During the course of the visit the patient was educated and counseled about appropriate screening and preventive services including : fall prevention , diabetes screening, nutrition counseling, colorectal cancer screening, and recommended immunizations.    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  follow up on  PREDIABETES.  She has intentionally lost  5 lbs and lowered a1c to 5.7 from 5.9.  Husband had COVID IN January so she ate less.  Drinking more water   Patient has received both doses of the [place] 19 vaccine without complications, as well as the Shingrx series . Marland Kitchen  Patient continues to mask when outside of the home except when walking in yard or at safe distances from others .  Patient denies any change in mood or development of unhealthy behaviors resuting from the pandemic's restriction of activities and socialization.     History Michele Hess has a past medical history of Allergy, GERD (gastroesophageal reflux disease), History of ETT, HLD (hyperlipidemia), Menopausal and postmenopausal disorder, Seizure ([place]) (01/2016), and Syncope and collapse (01/27/2016).   She has a past surgical history that includes Partial hysterectomy; echo (other) (9/10); Total abdominal hysterectomy (1993); Anterior cruciate ligament repair (Left, 2011); Tonsillectomy (1961); and Colonoscopy.   Her family history includes Colon polyps in her father; Diabetes in her brother and mother; Heart attack (age of onset: 57) in her mother; Heart  attack (age of onset: 50) in her father; Lung cancer in her brother.She reports that she quit smoking about 32 years ago. Her smoking use included cigarettes. She has a 17.00 pack-year smoking history. She has never used smokeless tobacco. She reports current alcohol use of about  3.0 standard drinks of alcohol per week. She reports that she does not use drugs.  Outpatient Medications Prior to Visit  Medication Sig Dispense Refill  . estradiol (ESTRACE VAGINAL) 0.1 MG/GM vaginal cream Place 1 Applicatorful vaginally daily.    . Multiple Vitamins-Minerals (EMERGEN-C VITAMIN C) PACK Take 1 packet by mouth daily.    . Omega-3 Fatty Acids (FISH OIL) 1000 MG CAPS Take by mouth 2 (two) times daily.    . pravastatin (PRAVACHOL) 20 MG tablet TAKE 1 TABLET BY MOUTH EVERY DAY IN THE EVENING 90 tablet 0  . PROGESTERONE, VAGINAL, 4 % GEL Place 0.5 mLs vaginally. 6 days a week.    . calcium carbonate (CALCIUM 600) 600 MG TABS tablet Take 1,200 mg by mouth daily with breakfast.    . omeprazole (PRILOSEC) 20 MG capsule Take 1 capsule (20 mg total) by mouth daily. (Patient not taking: Reported on 10/30/2019) 90 capsule 3  . Multiple Vitamin (MULTIVITAMIN) tablet Take 1 tablet by mouth daily.     Facility-Administered Medications Prior to Visit  Medication Dose Route Frequency Provider Last Rate Last Admin  . 0.9 %  sodium chloride infusion  500 mL Intravenous Once Armbruster, Carlota Raspberry, MD        Review of Systems   Patient denies headache, fevers, malaise, unintentional weight loss, skin rash, eye pain, sinus congestion and sinus pain, sore throat, dysphagia,  hemoptysis , cough, dyspnea, wheezing, chest pain, palpitations, orthopnea, edema, abdominal pain, nausea, melena, diarrhea, constipation, flank pain, dysuria, hematuria, urinary  Frequency, nocturia, numbness, tingling, seizures,  Focal weakness, Loss of consciousness,  Tremor, insomnia, depression, anxiety, and suicidal ideation.      Objective:  BP 122/82   Pulse 75   Temp (!) 97.2 F (36.2 C) (Temporal)   Ht 5\' 2"  (1.575 m)   Wt 128 lb 12.8 oz (58.4 kg)   SpO2 97%   BMI 23.56 kg/m   Physical Exam  General appearance: alert, cooperative and appears stated age Head: Normocephalic, without obvious abnormality,  atraumatic Eyes: conjunctivae/corneas clear. PERRL, EOM's intact. Fundi benign. Ears: normal TM's and external ear canals both ears Nose: Nares normal. Septum midline. Mucosa normal. No drainage or sinus tenderness. Throat: lips, mucosa, and tongue normal; teeth and gums normal Neck: no adenopathy, no carotid bruit, no JVD, supple, symmetrical, trachea midline and thyroid not enlarged, symmetric, no tenderness/mass/nodules Lungs: clear to auscultation bilaterally Breasts: deferred by patient Heart: regular rate and rhythm, S1, S2 normal, no murmur, click, rub or gallop Abdomen: soft, non-tender; bowel sounds normal; no masses,  no organomegaly Extremities: extremities normal, atraumatic, no cyanosis or edema Pulses: 2+ and symmetric Skin: Skin color, texture, turgor normal. No rashes or lesions Neurologic: Alert and oriented X 3, normal strength and tone. Normal symmetric reflexes. Normal coordination and gait.     Assessment & Plan:   Problem List Items Addressed This Visit      Unprioritized   Hyperlipidemia LDL goal <130    Lipids at  goal on  pravastatin  And LFTS are normal. No changes today    Lab Results  Component Value Date   CHOL 215 (H) 10/27/2019   HDL 78.20 10/27/2019   LDLCALC 117 (H) 10/27/2019  LDLDIRECT 137.0 06/05/2015   TRIG 100.0 10/27/2019   CHOLHDL 3 10/27/2019   Lab Results  Component Value Date   ALT 9 10/27/2019   AST 22 10/27/2019   ALKPHOS 51 10/27/2019   BILITOT 0.6 10/27/2019         Encounter for preventive health examination    age appropriate education and counseling updated, referrals for preventative services and immunizations addressed, dietary and smoking counseling addressed, most recent labs reviewed.  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   She haso checked  her pressures at home and reading have been 120/70 or less .  Renal function is normal .  Lab Results  Component Value Date   CREATININE 0.69 10/27/2019   Lab Results  Component Value Date   NA 135 10/27/2019   K 4.1 10/27/2019   CL 100 10/27/2019   CO2 29 10/27/2019            I am having Michele Hess maintain her Fish Oil, calcium carbonate, Emergen-C Vitamin C, omeprazole, pravastatin, PROGESTERONE (VAGINAL), and estradiol. We will continue to administer sodium chloride.  No orders of the defined types were placed in this encounter.   Medications Discontinued During This Encounter  Medication Reason  . Multiple Vitamin (MULTIVITAMIN) tablet Duplicate    Follow-up: No follow-ups on file.   Crecencio Mc, MD

## 2019-10-30 NOTE — Patient Instructions (Signed)
Your labs are fine!  Continue the pravastatin.   You've lowered your A1c into the "normal" range!  Goal calcium is 1200 mg daily    Health Maintenance for Postmenopausal Women Menopause is a normal process in which your ability to get pregnant comes to an end. This process happens slowly over many months or years, usually between the ages of 29 and 66. Menopause is complete when you have missed your menstrual periods for 12 months. It is important to talk with your health care provider about some of the most common conditions that affect women after menopause (postmenopausal women). These include heart disease, cancer, and bone loss (osteoporosis). Adopting a healthy lifestyle and getting preventive care can help to promote your health and wellness. The actions you take can also lower your chances of developing some of these common conditions. What should I know about menopause? During menopause, you may get a number of symptoms, such as:  Hot flashes. These can be moderate or severe.  Night sweats.  Decrease in sex drive.  Mood swings.  Headaches.  Tiredness.  Irritability.  Memory problems.  Insomnia. Choosing to treat or not to treat these symptoms is a decision that you make with your health care provider. Do I need hormone replacement therapy?  Hormone replacement therapy is effective in treating symptoms that are caused by menopause, such as hot flashes and night sweats.  Hormone replacement carries certain risks, especially as you become older. If you are thinking about using estrogen or estrogen with progestin, discuss the benefits and risks with your health care provider. What is my risk for heart disease and stroke? The risk of heart disease, heart attack, and stroke increases as you age. One of the causes may be a change in the body's hormones during menopause. This can affect how your body uses dietary fats, triglycerides, and cholesterol. Heart attack and stroke are  medical emergencies. There are many things that you can do to help prevent heart disease and stroke. Watch your blood pressure  High blood pressure causes heart disease and increases the risk of stroke. This is more likely to develop in people who have high blood pressure readings, are of African descent, or are overweight.  Have your blood pressure checked: ? Every 3-5 years if you are 64-35 years of age. ? Every year if you are 87 years old or older. Eat a healthy diet   Eat a diet that includes plenty of vegetables, fruits, low-fat dairy products, and lean protein.  Do not eat a lot of foods that are high in solid fats, added sugars, or sodium. Get regular exercise Get regular exercise. This is one of the most important things you can do for your health. Most adults should:  Try to exercise for at least 150 minutes each week. The exercise should increase your heart rate and make you sweat (moderate-intensity exercise).  Try to do strengthening exercises at least twice each week. Do these in addition to the moderate-intensity exercise.  Spend less time sitting. Even light physical activity can be beneficial. Other tips  Work with your health care provider to achieve or maintain a healthy weight.  Do not use any products that contain nicotine or tobacco, such as cigarettes, e-cigarettes, and chewing tobacco. If you need help quitting, ask your health care provider.  Know your numbers. Ask your health care provider to check your cholesterol and your blood sugar (glucose). Continue to have your blood tested as directed by your health care provider.  Do I need screening for cancer? Depending on your health history and family history, you may need to have cancer screening at different stages of your life. This may include screening for:  Breast cancer.  Cervical cancer.  Lung cancer.  Colorectal cancer. What is my risk for osteoporosis? After menopause, you may be at increased  risk for osteoporosis. Osteoporosis is a condition in which bone destruction happens more quickly than new bone creation. To help prevent osteoporosis or the bone fractures that can happen because of osteoporosis, you may take the following actions:  If you are 28-53 years old, get at least 1,000 mg of calcium and at least 600 mg of vitamin D per day.  If you are older than age 46 but younger than age 63, get at least 1,200 mg of calcium and at least 600 mg of vitamin D per day.  If you are older than age 40, get at least 1,200 mg of calcium and at least 800 mg of vitamin D per day. Smoking and drinking excessive alcohol increase the risk of osteoporosis. Eat foods that are rich in calcium and vitamin D, and do weight-bearing exercises several times each week as directed by your health care provider. How does menopause affect my mental health? Depression may occur at any age, but it is more common as you become older. Common symptoms of depression include:  Low or sad mood.  Changes in sleep patterns.  Changes in appetite or eating patterns.  Feeling an overall lack of motivation or enjoyment of activities that you previously enjoyed.  Frequent crying spells. Talk with your health care provider if you think that you are experiencing depression. General instructions See your health care provider for regular wellness exams and vaccines. This may include:  Scheduling regular health, dental, and eye exams.  Getting and maintaining your vaccines. These include: ? Influenza vaccine. Get this vaccine each year before the flu season begins. ? Pneumonia vaccine. ? Shingles vaccine. ? Tetanus, diphtheria, and pertussis (Tdap) booster vaccine. Your health care provider may also recommend other immunizations. Tell your health care provider if you have ever been abused or do not feel safe at home. Summary  Menopause is a normal process in which your ability to get pregnant comes to an  end.  This condition causes hot flashes, night sweats, decreased interest in sex, mood swings, headaches, or lack of sleep.  Treatment for this condition may include hormone replacement therapy.  Take actions to keep yourself healthy, including exercising regularly, eating a healthy diet, watching your weight, and checking your blood pressure and blood sugar levels.  Get screened for cancer and depression. Make sure that you are up to date with all your vaccines. This information is not intended to replace advice given to you by your health care provider. Make sure you discuss any questions you have with your health care provider. Document Revised: 07/13/2018 Document Reviewed: 07/13/2018 Elsevier Patient Education  2020 [employer].

## 2019-10-31 MED ORDER — CALCIUM CARBONATE 600 MG PO TABS: 600.0000 mg | ORAL_TABLET | Freq: Every day | ORAL | 1 refills | Status: DC

## 2019-10-31 NOTE — Assessment & Plan Note (Signed)
Lipids at  goal on  pravastatin  And LFTS are normal. No changes today    Lab Results  Component Value Date   CHOL 215 (H) 10/27/2019   HDL 78.20 10/27/2019   LDLCALC 117 (H) 10/27/2019   LDLDIRECT 137.0 06/05/2015   TRIG 100.0 10/27/2019   CHOLHDL 3 10/27/2019   Lab Results  Component Value Date   ALT 9 10/27/2019   AST 22 10/27/2019   ALKPHOS 51 10/27/2019   BILITOT 0.6 10/27/2019

## 2019-10-31 NOTE — Assessment & Plan Note (Signed)

## 2019-10-31 NOTE — Addendum Note (Signed)
Addended by: Crecencio Mc on: 10/31/2019 11:42 AM   Modules accepted: Orders

## 2019-10-31 NOTE — Assessment & Plan Note (Signed)
She has no history of hypertension but has had  mild elevations in the office.   She haso checked  her pressures at home and reading have been 120/70 or less .  Renal function is normal .  Lab Results  Component Value Date   CREATININE 0.69 10/27/2019   Lab Results  Component Value Date   NA 135 10/27/2019   K 4.1 10/27/2019   CL 100 10/27/2019   CO2 29 10/27/2019

## 2019-11-15 ENCOUNTER — Other Ambulatory Visit: Payer: Self-pay | Admitting: Internal Medicine

## 2020-02-19 DIAGNOSIS — E785 Hyperlipidemia, unspecified: Secondary | ICD-10-CM

## 2020-02-19 MED ORDER — PRAVASTATIN SODIUM 20 MG PO TABS: 20.0000 mg | ORAL_TABLET | Freq: Every day | ORAL | 0 refills | Status: DC

## 2020-02-19 MED ORDER — KETOCONAZOLE 2 % EX SHAM: 1.0000 "application " | MEDICATED_SHAMPOO | CUTANEOUS | 0 refills | Status: DC

## 2020-02-19 NOTE — Telephone Encounter (Signed)
Fasting Labs that are due in September for 6 month surveillance  Have been ordered   Ketoconazole and pravastatin have  been refilled and sent to [facility] [address]

## 2020-02-19 NOTE — Addendum Note (Signed)
Addended by: Crecencio Mc on: 02/19/2020 02:35 PM   Modules accepted: Orders

## 2020-02-19 NOTE — Telephone Encounter (Signed)
I have refilled the pravastatin. The ketoconazole was not in pt's current medication list. Also pt just had lads done in March is she due for them again?

## 2020-02-22 ENCOUNTER — Other Ambulatory Visit: Payer: Self-pay | Admitting: Internal Medicine

## 2020-02-27 IMAGING — MG DIGITAL SCREENING BILAT W/ TOMO W/ CAD
6 of 10 series · 6 of 30 positions shown · non-contrast
Comparison: Previous exam(s).

CLINICAL DATA: Screening.

EXAM:
DIGITAL SCREENING BILATERAL MAMMOGRAM WITH TOMO AND CAD

[R MLO synth-2D (1 of 2)]
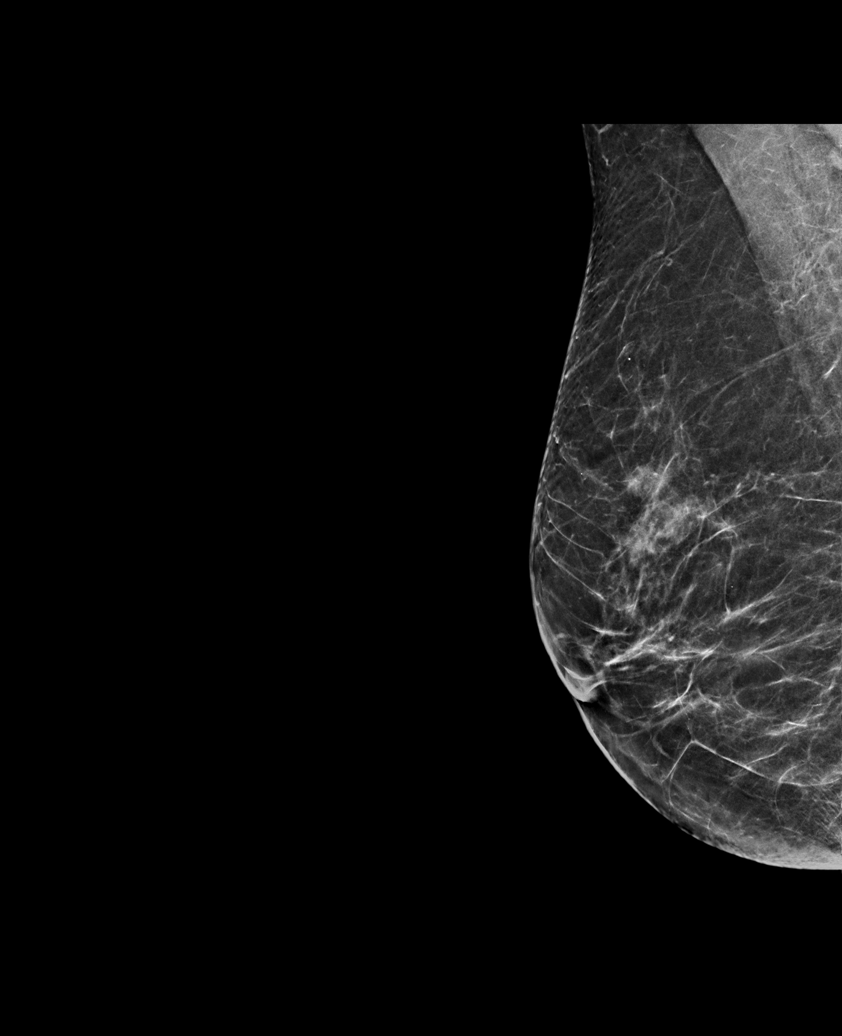

[R MLO synth-2D (2 of 2)]
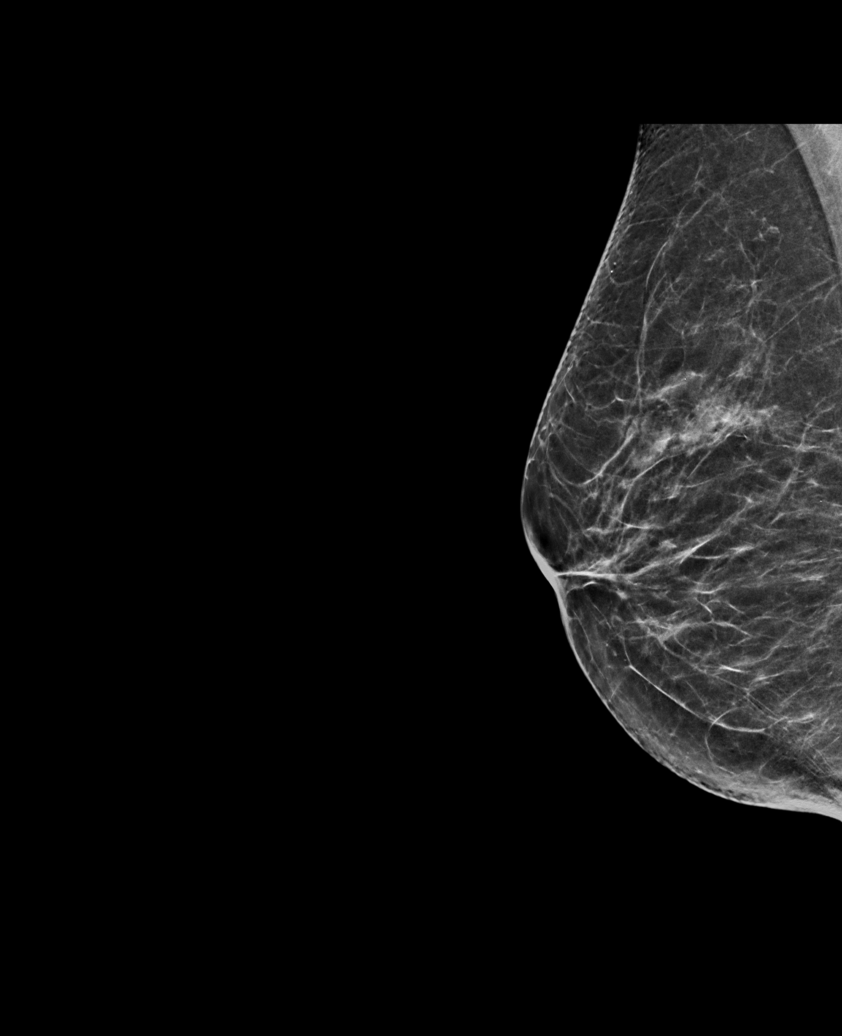

[L CC synth-2D]
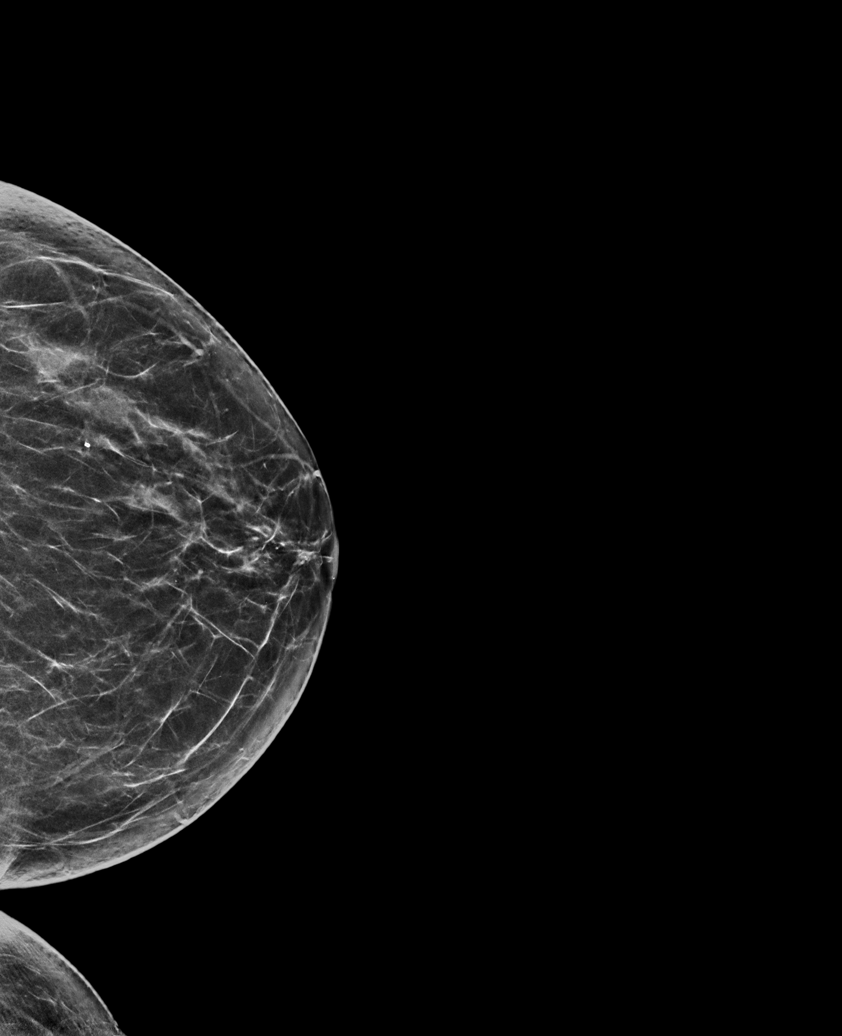

[L MLO synth-2D]
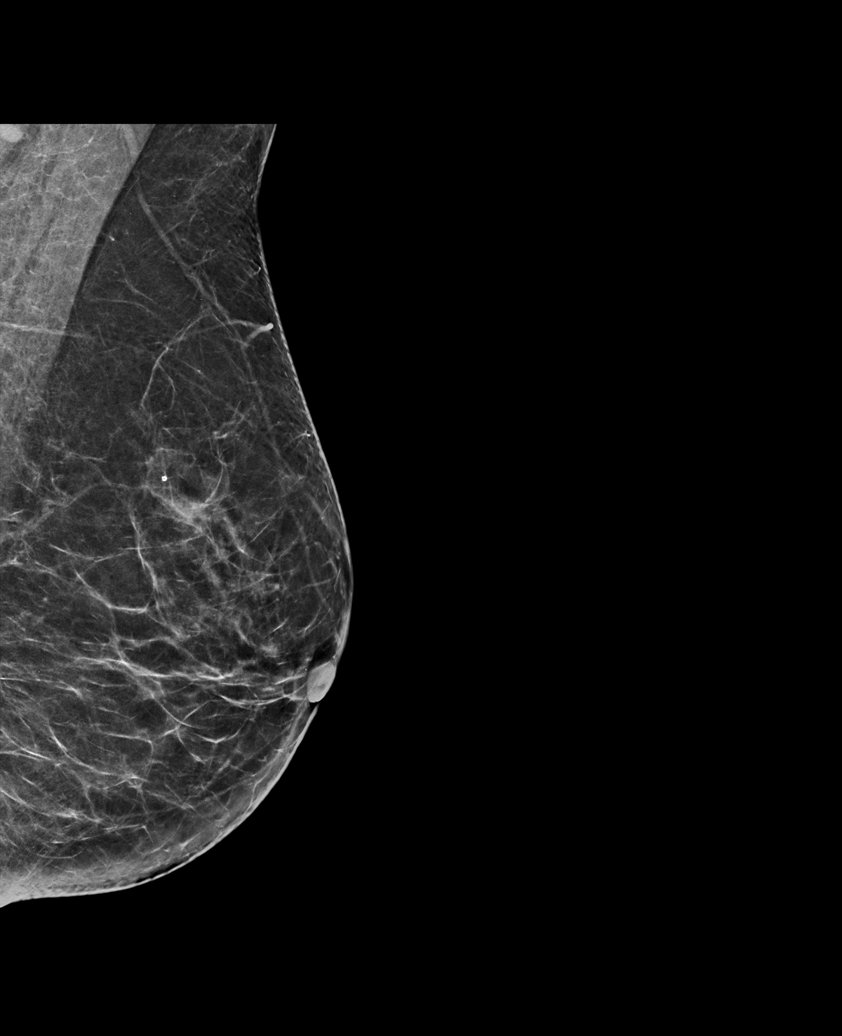

[R CC synth-2D]
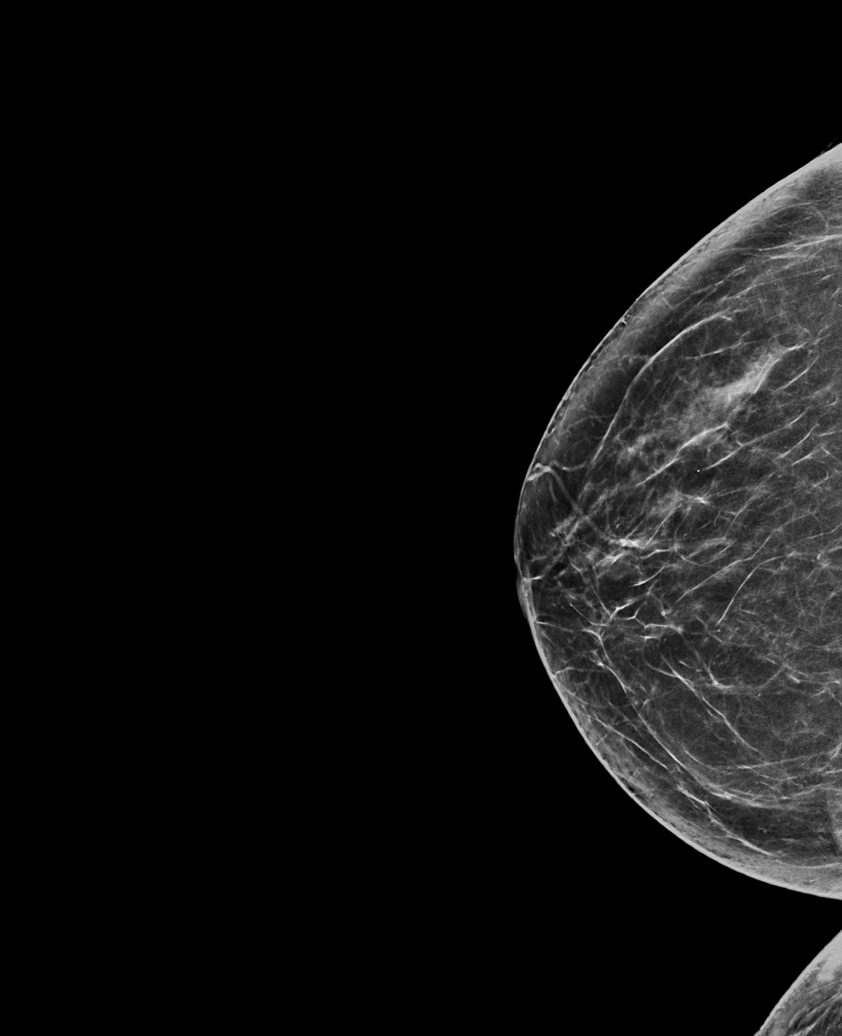

[R MLO tomo · tomo slice 33/65.0]
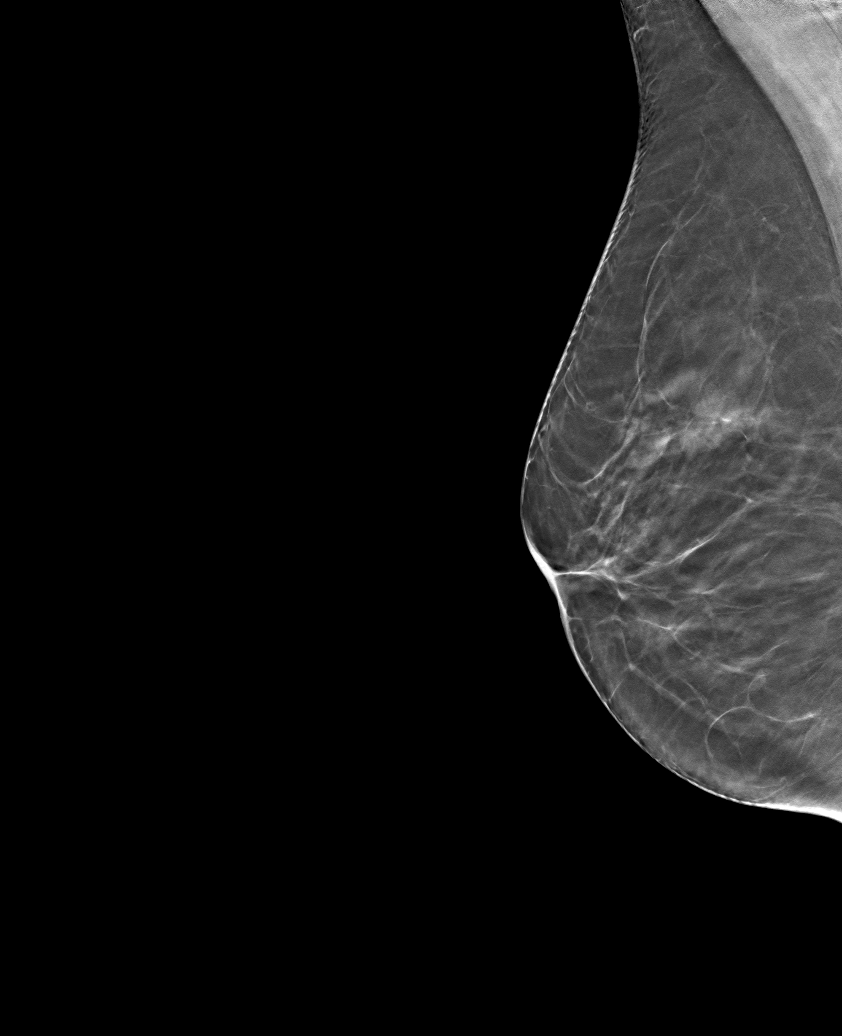

[6 of 30 positions shown; findings below may reference images not displayed]

ACR Breast Density Category b: There are scattered areas of
fibroglandular density.
FINDINGS: There are no findings suspicious for malignancy. Images were
processed with CAD.
IMPRESSION: No mammographic evidence of malignancy. A result letter of this
screening mammogram will be mailed directly to the patient.

RECOMMENDATION:
Screening mammogram in one year. (Code:[id number])

BI-RADS CATEGORY  1: Negative.

## 2020-03-14 ENCOUNTER — Other Ambulatory Visit: Payer: Self-pay | Admitting: Internal Medicine

## 2020-04-05 NOTE — Telephone Encounter (Signed)
Rx has been placed in quick sign folder for signature.

## 2020-04-19 ENCOUNTER — Other Ambulatory Visit: Payer: Self-pay

## 2020-04-19 ENCOUNTER — Ambulatory Visit (INDEPENDENT_AMBULATORY_CARE_PROVIDER_SITE_OTHER): Payer: BC Managed Care – PPO

## 2020-04-19 ENCOUNTER — Other Ambulatory Visit (INDEPENDENT_AMBULATORY_CARE_PROVIDER_SITE_OTHER): Payer: BC Managed Care – PPO

## 2020-04-19 DIAGNOSIS — Z23 Encounter for immunization: Secondary | ICD-10-CM | POA: Diagnosis not present

## 2020-04-19 DIAGNOSIS — E785 Hyperlipidemia, unspecified: Secondary | ICD-10-CM | POA: Diagnosis not present

## 2020-04-19 LAB — LIPID PANEL
Cholesterol: 199 mg/dL (ref 0–200)
HDL: 77 mg/dL (ref >39.00)
LDL Cholesterol: 106 mg/dL — ABNORMAL HIGH (ref 0–99)
NonHDL: 122.23
Total CHOL/HDL Ratio: 3
Triglycerides: 83 mg/dL (ref 0.0–149.0)
VLDL: 16.6 mg/dL (ref 0.0–40.0)

## 2020-04-19 LAB — COMPREHENSIVE METABOLIC PANEL
ALT: 7 U/L (ref 0–35)
AST: 20 U/L (ref 0–37)
Albumin: 4.6 g/dL (ref 3.5–5.2)
Alkaline Phosphatase: 55 U/L (ref 39–117)
BUN: 16 mg/dL (ref 6–23)
CO2: 28 mEq/L (ref 19–32)
Calcium: 9.5 mg/dL (ref 8.4–10.5)
Chloride: 102 mEq/L (ref 96–112)
Creatinine, Ser: 0.63 mg/dL (ref 0.40–1.20)
GFR: 95.35 mL/min (ref >60.00)
Glucose, Bld: 94 mg/dL (ref 70–99)
Potassium: 4 mEq/L (ref 3.5–5.1)
Sodium: 138 mEq/L (ref 135–145)
Total Bilirubin: 0.6 mg/dL (ref 0.2–1.2)
Total Protein: 7 g/dL (ref 6.0–8.3)

## 2020-04-22 ENCOUNTER — Other Ambulatory Visit: Payer: BC Managed Care – PPO

## 2020-04-26 ENCOUNTER — Other Ambulatory Visit: Payer: BC Managed Care – PPO

## 2020-05-02 ENCOUNTER — Telehealth: Payer: Self-pay | Admitting: Internal Medicine

## 2020-05-02 NOTE — Progress Notes (Signed)
Patient called in stated that her 2nd dosage for covid vaccine lot number was entered in wrong the correct lot number is on the card

## 2020-05-02 NOTE — Telephone Encounter (Signed)
Patient called in stated that the lot number on her 2nd dosage for covid was entered wrong the correct lot number is on the card

## 2020-05-06 NOTE — Telephone Encounter (Signed)
noted 

## 2020-05-12 ENCOUNTER — Other Ambulatory Visit: Payer: Self-pay | Admitting: Internal Medicine

## 2020-05-29 ENCOUNTER — Other Ambulatory Visit: Payer: Self-pay

## 2020-05-29 MED ORDER — PRAVASTATIN SODIUM 20 MG PO TABS
20.0000 mg | ORAL_TABLET | Freq: Every day | ORAL | 0 refills | Status: DC
Start: 2020-05-29 — End: 2020-11-18

## 2020-07-02 ENCOUNTER — Other Ambulatory Visit: Payer: Self-pay | Admitting: Internal Medicine

## 2020-07-02 DIAGNOSIS — Z1231 Encounter for screening mammogram for malignant neoplasm of breast: Secondary | ICD-10-CM

## 2020-07-31 ENCOUNTER — Other Ambulatory Visit: Payer: Self-pay

## 2020-07-31 ENCOUNTER — Ambulatory Visit
Admission: RE | Admit: 2020-07-31 | Discharge: 2020-07-31 | Disposition: A | Discharge status: Home / Self Care | Payer: BC Managed Care – PPO | Source: Ambulatory Visit | Attending: Internal Medicine | Admitting: Internal Medicine

## 2020-07-31 DIAGNOSIS — Z1231 Encounter for screening mammogram for malignant neoplasm of breast: Secondary | ICD-10-CM

## 2020-08-08 ENCOUNTER — Telehealth: Payer: Self-pay | Admitting: Internal Medicine

## 2020-08-08 NOTE — Telephone Encounter (Signed)
Patient called in wanted to know if she could get a x-ray of her rib under her breast she had been to the chiropractor for the pain but would feel better if she could get it x-ray

## 2020-08-08 NOTE — Telephone Encounter (Signed)
Called to schedule Patient with another provider for next week. Patient declined as she will be traveling back to [place].  Informed the Patient of urgent care or walk in hours at [facility]. Patient states she is a Patient with [facility] and she will go to their walk in hours for evaluation

## 2020-09-30 LAB — HM DEXA SCAN

## 2020-10-01 HISTORY — PX: LUMBAR LAMINECTOMY: SHX95

## 2020-10-03 LAB — COMPREHENSIVE METABOLIC PANEL
Albumin: 4.6 (ref 3.5–5.0)
Calcium: 9.7 (ref 8.7–10.7)
GFR calc Af Amer: 112
GFR calc non Af Amer: 97
Globulin: 2.6

## 2020-10-03 LAB — BASIC METABOLIC PANEL
BUN: 12 (ref 4–21)
CO2: 27 — AB (ref 13–22)
Chloride: 102 (ref 99–108)
Creatinine: 0.6 (ref 0.5–1.1)
Glucose: 93
Potassium: 4.3 (ref 3.4–5.3)
Sodium: 138 (ref 137–147)

## 2020-10-03 LAB — HEPATIC FUNCTION PANEL
ALT: 13 (ref 7–35)
AST: 23 (ref 13–35)
Alkaline Phosphatase: 62 (ref 25–125)
Bilirubin, Total: 0.5

## 2020-10-03 LAB — VITAMIN D 25 HYDROXY (VIT D DEFICIENCY, FRACTURES): Vit D, 25-Hydroxy: 68

## 2020-10-03 LAB — LIPID PANEL
Cholesterol: 222 — AB (ref 0–200)
HDL: 68 (ref 35–70)
LDL Cholesterol: 134
Triglycerides: 102 (ref 40–160)

## 2020-10-03 LAB — TSH: TSH: 1.89 (ref 0.41–5.90)

## 2020-11-18 ENCOUNTER — Other Ambulatory Visit: Payer: Self-pay | Admitting: Internal Medicine

## 2020-12-18 ENCOUNTER — Encounter: Payer: Self-pay | Admitting: Gastroenterology

## 2021-02-17 ENCOUNTER — Other Ambulatory Visit: Payer: Self-pay | Admitting: Internal Medicine

## 2021-03-10 ENCOUNTER — Other Ambulatory Visit: Payer: Self-pay

## 2021-03-10 ENCOUNTER — Ambulatory Visit (AMBULATORY_SURGERY_CENTER): Payer: Self-pay

## 2021-03-10 VITALS — Ht 63.0 in | Wt 135.0 lb

## 2021-03-10 DIAGNOSIS — Z86010 Personal history of colonic polyps: Secondary | ICD-10-CM

## 2021-03-10 MED ORDER — PLENVU 140 G PO SOLR: 1.0000 | ORAL | 0 refills | Status: DC

## 2021-03-10 NOTE — Progress Notes (Signed)
No egg or soy allergy known to patient  No issues with past sedation with any surgeries or procedures Patient denies ever being told they had issues or difficulty with intubation  No FH of Malignant Hyperthermia No diet pills per patient No home 02 use per patient  No blood thinners per patient  Pt denies issues with constipation at this time; uses Miralax if gets constipation and increases fluids/exercise/fruits/veggies No A fib or A flutter  EMMI video via MyChart  COVID 19 guidelines implemented in [place] today with Pt and RN  Pt is fully vaccinated  for Covid  Coupon given to pt in PV today, Code to Pharmacy and NO PA's for preps discussed with pt In PV today  Discussed with pt there will be an out-of-pocket cost for prep and that varies from $0 to 70 dollars  Due to the COVID-19 pandemic we are asking patients to follow certain guidelines.  Pt aware of COVID protocols and [facility] guidelines

## 2021-03-24 ENCOUNTER — Encounter: Payer: Self-pay | Admitting: Gastroenterology

## 2021-03-24 ENCOUNTER — Ambulatory Visit (AMBULATORY_SURGERY_CENTER): Payer: BC Managed Care – PPO | Admitting: Gastroenterology

## 2021-03-24 ENCOUNTER — Other Ambulatory Visit: Payer: Self-pay

## 2021-03-24 VITALS — BP 113/72 | HR 78 | Temp 98.9°F | Resp 16 | Ht 63.0 in | Wt 135.0 lb

## 2021-03-24 DIAGNOSIS — D128 Benign neoplasm of rectum: Secondary | ICD-10-CM

## 2021-03-24 DIAGNOSIS — K635 Polyp of colon: Secondary | ICD-10-CM | POA: Diagnosis not present

## 2021-03-24 DIAGNOSIS — D124 Benign neoplasm of descending colon: Secondary | ICD-10-CM

## 2021-03-24 DIAGNOSIS — Z86010 Personal history of colonic polyps: Secondary | ICD-10-CM

## 2021-03-24 DIAGNOSIS — K621 Rectal polyp: Secondary | ICD-10-CM

## 2021-03-24 DIAGNOSIS — D123 Benign neoplasm of transverse colon: Secondary | ICD-10-CM

## 2021-03-24 MED ORDER — SODIUM CHLORIDE 0.9 % IV SOLN: 500.0000 mL | Freq: Once | INTRAVENOUS | Status: DC

## 2021-03-24 NOTE — Progress Notes (Signed)
[facility] History and Physical   Primary Care Physician:  Crecencio Mc, MD   Reason for Procedure:   History of colon polyps - advanced adenoma 2019  Plan:    colonoscopy     HPI: Michele Hess is a 65 y.o. female with a history of colon polyps, 57m adenoma removed in 01/2018. Here for surveillance colonoscopy. No new symptoms, otherwise feeling well. No FH of CRC.   Past Medical History:  Diagnosis Date   Anxiety    "white coat syndrome"   GERD (gastroesophageal reflux disease)    History of ETT    myoview 7/09: 9 min 31 sec, EF 71%, nov evidence for ischemia. ETT 99/10)" 1030", excellent exercise tolerance, no abnormal rhythms, no evidence for ischemia   HLD (hyperlipidemia)    Menopausal and postmenopausal disorder    Osteopenia    Seasonal allergies    Seizure ([facility] 01/2016   Dr. WJannifer Franklin per pt- MD told her is was not a seizure   Syncope and collapse 01/27/2016    Past Surgical History:  Procedure Laterality Date   ANTERIOR CRUCIATE LIGAMENT REPAIR Left 2011   COLONOSCOPY  2019   SA-MAC-prep good-TA's-recall 3 yrs   echo (other)  04/2009   EF 60%, lv normal, RV normal, mild MR, trivial pericardial effusion    LUMBAR LAMINECTOMY  10/2020   PARTIAL HYSTERECTOMY     PELVIC LAPAROSCOPY  1987   POLYPECTOMY  2019   TA's   THalf MoonEXTRACTION      Prior to Admission medications   Medication Sig Start Date End Date Taking? Authorizing Provider  calcium carbonate (CALCIUM 600) 600 MG TABS tablet Take 1 tablet (600 mg total) by mouth daily with breakfast. 10/31/19  Yes TCrecencio Mc MD  pravastatin (PRAVACHOL) 20 MG tablet TAKE 1 TABLET BY MOUTH EVERY DAY 02/17/21  Yes TCrecencio Mc MD  PROGESTERONE, VAGINAL, 4 % GEL Place 0.5 mLs vaginally. 6 days a week.   Yes [provider]  loratadine (CLARITIN) 10 MG tablet Take 10 mg by mouth daily as needed for allergies.     [provider]  Multiple Vitamins-Minerals (EMERGEN-C VITAMIN C) PACK Take 1 packet by mouth daily as needed.    [provider]  Pseudoephedrine HCl (SUDAFED PO) Take 1 tablet by mouth daily as needed.    [provider]    Current Outpatient Medications  Medication Sig Dispense Refill   calcium carbonate (CALCIUM 600) 600 MG TABS tablet Take 1 tablet (600 mg total) by mouth daily with breakfast. 90 tablet 1   pravastatin (PRAVACHOL) 20 MG tablet TAKE 1 TABLET BY MOUTH EVERY DAY 90 tablet 0   PROGESTERONE, VAGINAL, 4 % GEL Place 0.5 mLs vaginally. 6 days a week.     loratadine (CLARITIN) 10 MG tablet Take 10 mg by mouth daily as needed for allergies.     Multiple Vitamins-Minerals (EMERGEN-C VITAMIN C) PACK Take 1 packet by mouth daily as needed.     Pseudoephedrine HCl (SUDAFED PO) Take 1 tablet by mouth daily as needed.     Current Facility-Administered Medications  Medication Dose Route Frequency Provider Last Rate Last Admin   0.9 %  sodium chloride infusion  500 mL Intravenous Once Mindy Behnken, SCarlota Raspberry MD       0.9 %  sodium chloride infusion  500 mL Intravenous Once Ariannie Penaloza, SCarlota Raspberry MD  Allergies as of 03/24/2021 - Review Complete 03/24/2021  Allergen Reaction Noted   Neomycin  04/01/2009    Family History  Problem Relation Age of Onset   Heart attack Mother 24   Diabetes Mother    Heart attack Father 20   Lung cancer Brother    Diabetes Brother    Colon cancer Neg Hx    Stomach cancer Neg Hx    Esophageal cancer Neg Hx    Rectal cancer Neg Hx    Colon polyps Neg Hx     Social History   Socioeconomic History   Marital status: Married    Spouse name: Michele Hess   Number of children: 2   Years of education: 12   Highest education level: Not on file  Occupational History   Occupation: Retired  Tobacco Use   Smoking status: Former    Packs/day: 1.00    Years: 17.00    Pack years: 17.00    Types: Cigarettes    Quit date:  05/15/1987    Years since quitting: 33.8   Smokeless tobacco: Never   Tobacco comments:    quit 27 years ago   Vaping Use   Vaping Use: Never used  Substance and Sexual Activity   Alcohol use: Not Currently    Alcohol/week: 5.0 standard drinks    Types: 5 Standard drinks or equivalent per week    Comment: socially   Drug use: No   Sexual activity: Yes    Birth control/protection: Post-menopausal  Other Topics Concern   Not on file  Social History Narrative   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    Lives in [place] w/ her husband   Right-handed   Drinks 1/2 caf coffee 2-3 times per day   Social Determinants of Health   Financial Resource Strain: Not on file  Food Insecurity: Not on file  Transportation Needs: Not on file  Physical Activity: Not on file  Stress: Not on file  Social Connections: Not on file  Intimate Partner Violence: Not on file    Review of Systems: All other review of systems negative except as mentioned in the HPI.  Physical Exam: Vital signs BP (!) 182/100   Pulse (!) 112   Temp 98.9 F (37.2 C)   Resp 15   Ht _0  (1.6 m)   Wt 135 lb (61.2 kg)   SpO2 100%   BMI 23.91 kg/m   General:   Alert,  Well-developed, well-nourished, pleasant and cooperative in NAD Lungs:  Clear throughout to auscultation.   Heart:  Regular rate and rhythm;  Abdomen:  Soft, nontender and nondistended.  Neuro/Psych:  Alert and cooperative. Normal mood and affect. A and O x 3  Jolly Mango, MD [facility]

## 2021-03-24 NOTE — Progress Notes (Signed)
Report to PACU, RN, vss, BBS= Clear.  

## 2021-03-24 NOTE — Progress Notes (Signed)
No problems noted in the recovery room. maw 

## 2021-03-24 NOTE — Progress Notes (Signed)
VS by CW  Pt's states no medical or surgical changes since previsit or office visit.  

## 2021-03-24 NOTE — Progress Notes (Signed)
Called to room to assist during endoscopic procedure.  Patient ID and intended procedure confirmed with present staff. Received instructions for my participation in the procedure from the performing physician.  

## 2021-03-24 NOTE — Patient Instructions (Signed)
Handouts given:  polyps, diverticulosis Resume previous diet Continue current medications Await pathology results  YOU HAD AN ENDOSCOPIC PROCEDURE TODAY AT [facility]:   Refer to the procedure report that was given to you for any specific questions about what was found during the examination.  If the procedure report does not answer your questions, please call your gastroenterologist to clarify.  If you requested that your care partner not be given the details of your procedure findings, then the procedure report has been included in a sealed envelope for you to review at your convenience later.  YOU SHOULD EXPECT: Some feelings of bloating in the abdomen. Passage of more gas than usual.  Walking can help get rid of the air that was put into your GI tract during the procedure and reduce the bloating. If you had a lower endoscopy (such as a colonoscopy or flexible sigmoidoscopy) you may notice spotting of blood in your stool or on the toilet paper. If you underwent a bowel prep for your procedure, you may not have a normal bowel movement for a few days.  Please Note:  You might notice some irritation and congestion in your nose or some drainage.  This is from the oxygen used during your procedure.  There is no need for concern and it should clear up in a day or so.  SYMPTOMS TO REPORT IMMEDIATELY:  Following lower endoscopy (colonoscopy or flexible sigmoidoscopy):  Excessive amounts of blood in the stool  Significant tenderness or worsening of abdominal pains  Swelling of the abdomen that is new, acute  Fever of 100F or higher  For urgent or emergent issues, a gastroenterologist can be reached at any hour by calling [phone]. Do not use MyChart messaging for urgent concerns.   DIET:  We do recommend a small meal at first, but then you may proceed to your regular diet.  Drink plenty of fluids but you should avoid alcoholic beverages for 24 hours.  ACTIVITY:  You should  plan to take it easy for the rest of today and you should NOT DRIVE or use heavy machinery until tomorrow (because of the sedation medicines used during the test).    FOLLOW UP: Our staff will call the number listed on your records 48-72 hours following your procedure to check on you and address any questions or concerns that you may have regarding the information given to you following your procedure. If we do not reach you, we will leave a message.  We will attempt to reach you two times.  During this call, we will ask if you have developed any symptoms of COVID 19. If you develop any symptoms (ie: fever, flu-like symptoms, shortness of breath, cough etc.) before then, please call [phone].  If you test positive for Covid 19 in the 2 weeks post procedure, please call and report this information to [place].    If any biopsies were taken you will be contacted by phone or by letter within the next 1-3 weeks.  Please call us at [phone] if you have not heard about the biopsies in 3 weeks.   SIGNATURES/CONFIDENTIALITY: You and/or your care partner have signed paperwork which will be entered into your electronic medical record.  These signatures attest to the fact that that the information above on your After Visit Summary has been reviewed and is understood.  Full responsibility of the confidentiality of this discharge information lies with you and/or your care-partner.

## 2021-03-24 NOTE — Op Note (Signed)
[facility] Patient Name: Michele Hess Procedure Date: 03/24/2021 11:21 AM MRN: [id number] Endoscopist: Remo Lipps P. Havery Moros , MD Age: 65 Referring MD:  Date of Birth: 12/22/1955 Gender: Female Account #: [id number] Procedure:                Colonoscopy Indications:              High risk colon cancer surveillance: Personal                            history of colonic polyps - advanced adenoma                            removed 01/2018 Medicines:                Monitored Anesthesia Care Procedure:                Pre-Anesthesia Assessment:                           - Prior to the procedure, a History and Physical                            was performed, and patient medications and                            allergies were reviewed. The patient's tolerance of                            previous anesthesia was also reviewed. The risks                            and benefits of the procedure and the sedation                            options and risks were discussed with the patient.                            All questions were answered, and informed consent                            was obtained. Prior Anticoagulants: The patient has                            taken no previous anticoagulant or antiplatelet                            agents. ASA Grade Assessment: II - A patient with                            mild systemic disease. After reviewing the risks                            and benefits, the patient was deemed in  satisfactory condition to undergo the procedure.                           After obtaining informed consent, the colonoscope                            was passed under direct vision. Throughout the                            procedure, the patient's blood pressure, pulse, and                            oxygen saturations were monitored continuously. The                            PCF-HQ190L Colonoscope was introduced through  the                            anus and advanced to the the cecum, identified by                            appendiceal orifice and ileocecal valve. The                            colonoscopy was performed without difficulty. The                            patient tolerated the procedure well. The quality                            of the bowel preparation was good. The ileocecal                            valve, appendiceal orifice, and rectum were                            photographed. Scope In: 11:34:59 AM Scope Out: 11:59:15 AM Scope Withdrawal Time: 0 hours 18 minutes 6 seconds  Total Procedure Duration: 0 hours 24 minutes 16 seconds  Findings:                 The perianal and digital rectal examinations were                            normal.                           A 5 mm polyp was found in the splenic flexure. The                            polyp was flat. The polyp was removed with a cold                            snare. Resection and retrieval were complete.  A 3 mm polyp was found in the descending colon. The                            polyp was flat. The polyp was removed with a cold                            snare. Resection and retrieval were complete.                           Two sessile polyps were found in the proximal                            rectum. The polyps were 2 to 3 mm in size.                           A few small-mouthed diverticula were found in the                            sigmoid colon.                           The exam was otherwise without abnormality. Complications:            No immediate complications. Estimated blood loss:                            Minimal. Estimated Blood Loss:     Estimated blood loss was minimal. Impression:               - One 5 mm polyp at the splenic flexure, removed                            with a cold snare. Resected and retrieved.                           - One 3 mm polyp in the  descending colon, removed                            with a cold snare. Resected and retrieved.                           - Two 2 to 3 mm polyps in the proximal rectum.                           - Diverticulosis in the sigmoid colon.                           - The examination was otherwise normal. Recommendation:           - Patient has a contact number available for                            emergencies. The signs and symptoms of potential  delayed complications were discussed with the                            patient. Return to normal activities tomorrow.                            Written discharge instructions were provided to the                            patient.                           - Resume previous diet.                           - Continue present medications.                           - Await pathology results. Remo Lipps P. Arya Boxley, MD 03/24/2021 12:04:09 PM This report has been signed electronically.

## 2021-03-25 DIAGNOSIS — S52509A Unspecified fracture of the lower end of unspecified radius, initial encounter for closed fracture: Secondary | ICD-10-CM | POA: Insufficient documentation

## 2021-03-25 DIAGNOSIS — S52613A Displaced fracture of unspecified ulna styloid process, initial encounter for closed fracture: Secondary | ICD-10-CM | POA: Insufficient documentation

## 2021-03-26 ENCOUNTER — Telehealth: Payer: Self-pay

## 2021-03-26 NOTE — Telephone Encounter (Signed)
  Follow up Call-  Call back number 03/24/2021  Post procedure Call Back phone  # [phone]  Permission to leave phone message Yes  Some recent data might be hidden     Patient questions:  Do you have a fever, pain , or abdominal swelling? No. Pain Score  0 *  Have you tolerated food without any problems? Yes.    Have you been able to return to your normal activities? Yes.    Do you have any questions about your discharge instructions: Diet   No. Medications  No. Follow up visit  No.  Do you have questions or concerns about your Care? No.  Actions: * If pain score is 4 or above: No action needed, pain <4.

## 2021-04-03 ENCOUNTER — Telehealth: Payer: Self-pay

## 2021-04-03 DIAGNOSIS — Z0000 Encounter for general adult medical examination without abnormal findings: Secondary | ICD-10-CM

## 2021-04-03 DIAGNOSIS — E785 Hyperlipidemia, unspecified: Secondary | ICD-10-CM

## 2021-04-03 NOTE — Telephone Encounter (Signed)
Pt would like to have her labs done prior to her physical on 05/21/2021. I have ordered CBC, TSH, A1c, CMP and lipid panel. Is there anything else that needs to be ordered?

## 2021-04-03 NOTE — Telephone Encounter (Signed)
Pt would like to have labs done prior to CPE in October.

## 2021-04-08 NOTE — Telephone Encounter (Signed)
error 

## 2021-04-15 ENCOUNTER — Other Ambulatory Visit: Payer: Self-pay

## 2021-04-15 ENCOUNTER — Ambulatory Visit (INDEPENDENT_AMBULATORY_CARE_PROVIDER_SITE_OTHER): Payer: BC Managed Care – PPO

## 2021-04-15 DIAGNOSIS — Z23 Encounter for immunization: Secondary | ICD-10-CM

## 2021-05-17 ENCOUNTER — Other Ambulatory Visit: Payer: Self-pay | Admitting: Internal Medicine

## 2021-05-19 ENCOUNTER — Other Ambulatory Visit (INDEPENDENT_AMBULATORY_CARE_PROVIDER_SITE_OTHER): Payer: BC Managed Care – PPO

## 2021-05-19 ENCOUNTER — Other Ambulatory Visit: Payer: Self-pay

## 2021-05-19 DIAGNOSIS — Z0000 Encounter for general adult medical examination without abnormal findings: Secondary | ICD-10-CM | POA: Diagnosis not present

## 2021-05-19 DIAGNOSIS — E785 Hyperlipidemia, unspecified: Secondary | ICD-10-CM

## 2021-05-19 LAB — LIPID PANEL
Cholesterol: 206 mg/dL — ABNORMAL HIGH (ref 0–200)
HDL: 80.2 mg/dL (ref >39.00)
LDL Cholesterol: 106 mg/dL — ABNORMAL HIGH (ref 0–99)
NonHDL: 125.44
Total CHOL/HDL Ratio: 3
Triglycerides: 99 mg/dL (ref 0.0–149.0)
VLDL: 19.8 mg/dL (ref 0.0–40.0)

## 2021-05-19 LAB — HEMOGLOBIN A1C: Hgb A1c MFr Bld: 5.7 % (ref 4.6–6.5)

## 2021-05-19 LAB — CBC WITH DIFFERENTIAL/PLATELET
Basophils Absolute: 0 10*3/uL (ref 0.0–0.1)
Basophils Relative: 0.5 % (ref 0.0–3.0)
Eosinophils Absolute: 0.1 10*3/uL (ref 0.0–0.7)
Eosinophils Relative: 1.6 % (ref 0.0–5.0)
HCT: 40.9 % (ref 36.0–46.0)
Hemoglobin: 13.4 g/dL (ref 12.0–15.0)
Lymphocytes Relative: 44.6 % (ref 12.0–46.0)
Lymphs Abs: 2.2 10*3/uL (ref 0.7–4.0)
MCHC: 32.7 g/dL (ref 30.0–36.0)
MCV: 89.9 fl (ref 78.0–100.0)
Monocytes Absolute: 0.6 10*3/uL (ref 0.1–1.0)
Monocytes Relative: 12.1 % — ABNORMAL HIGH (ref 3.0–12.0)
Neutro Abs: 2.1 10*3/uL (ref 1.4–7.7)
Neutrophils Relative %: 41.2 % — ABNORMAL LOW (ref 43.0–77.0)
Platelets: 363 10*3/uL (ref 150.0–400.0)
RBC: 4.54 Mil/uL (ref 3.87–5.11)
RDW: 13.4 % (ref 11.5–15.5)
WBC: 5 10*3/uL (ref 4.0–10.5)

## 2021-05-19 LAB — COMPREHENSIVE METABOLIC PANEL
ALT: 12 U/L (ref 0–35)
AST: 27 U/L (ref 0–37)
Albumin: 4.5 g/dL (ref 3.5–5.2)
Alkaline Phosphatase: 68 U/L (ref 39–117)
BUN: 12 mg/dL (ref 6–23)
CO2: 27 mEq/L (ref 19–32)
Calcium: 9.7 mg/dL (ref 8.4–10.5)
Chloride: 101 mEq/L (ref 96–112)
Creatinine, Ser: 0.66 mg/dL (ref 0.40–1.20)
GFR: 92.72 mL/min (ref >60.00)
Glucose, Bld: 97 mg/dL (ref 70–99)
Potassium: 4.1 mEq/L (ref 3.5–5.1)
Sodium: 136 mEq/L (ref 135–145)
Total Bilirubin: 0.4 mg/dL (ref 0.2–1.2)
Total Protein: 7.3 g/dL (ref 6.0–8.3)

## 2021-05-19 LAB — TSH: TSH: 2.02 u[IU]/mL (ref 0.35–5.50)

## 2021-05-21 ENCOUNTER — Encounter: Payer: Self-pay | Admitting: Internal Medicine

## 2021-05-21 ENCOUNTER — Ambulatory Visit (INDEPENDENT_AMBULATORY_CARE_PROVIDER_SITE_OTHER): Payer: BC Managed Care – PPO | Admitting: Internal Medicine

## 2021-05-21 ENCOUNTER — Other Ambulatory Visit: Payer: Self-pay

## 2021-05-21 VITALS — BP 148/82 | HR 100 | Temp 96.0°F | Ht 63.0 in | Wt 136.8 lb

## 2021-05-21 DIAGNOSIS — S52324D Nondisplaced transverse fracture of shaft of right radius, subsequent encounter for closed fracture with routine healing: Secondary | ICD-10-CM

## 2021-05-21 DIAGNOSIS — R030 Elevated blood-pressure reading, without diagnosis of hypertension: Secondary | ICD-10-CM | POA: Diagnosis not present

## 2021-05-21 DIAGNOSIS — M85859 Other specified disorders of bone density and structure, unspecified thigh: Secondary | ICD-10-CM | POA: Diagnosis not present

## 2021-05-21 DIAGNOSIS — Z1231 Encounter for screening mammogram for malignant neoplasm of breast: Secondary | ICD-10-CM | POA: Diagnosis not present

## 2021-05-21 DIAGNOSIS — E785 Hyperlipidemia, unspecified: Secondary | ICD-10-CM

## 2021-05-21 DIAGNOSIS — Z0000 Encounter for general adult medical examination without abnormal findings: Secondary | ICD-10-CM | POA: Diagnosis not present

## 2021-05-21 LAB — MICROALBUMIN / CREATININE URINE RATIO
Creatinine,U: 12.4 mg/dL
Microalb Creat Ratio: 5.7 mg/g (ref 0.0–30.0)
Microalb, Ur: <0.7 mg/dL (ref 0.0–1.9)

## 2021-05-21 NOTE — Progress Notes (Signed)
Patient ID: Michele Hess, female    DOB: 1956/06/13  Age: 65 y.o. MRN: [id number]  The patient is here for annual  preventive  examination and management of other chronic and acute problems.   The risk factors are reflected in the social history.  The roster of all physicians providing medical care to patient - is listed in the Snapshot section of the chart.  Activities of daily living:  The patient is 100% independent in all ADLs: dressing, toileting, feeding as well as independent mobility  Home safety : The patient has smoke detectors in the home. They wear seatbelts.  There are no firearms at home. There is no violence in the home.   There is no risks for hepatitis, STDs or HIV. There is no   history of blood transfusion. They have no travel history to infectious disease endemic areas of the world.  The patient has seen their dentist in the last six month. They have seen their eye doctor in the last year. They admit to slight hearing difficulty with regard to whispered voices and some television programs.  They have deferred audiologic testing in the last year.  They do not  have excessive sun exposure. Discussed the need for sun protection: hats, long sleeves and use of sunscreen if there is significant sun exposure.   Diet: the importance of a healthy diet is discussed. They do have a healthy diet.  The benefits of regular aerobic exercise were discussed. She walks 4 times per week ,  20 minutes.   Depression screen: there are no signs or vegative symptoms of depression- irritability, change in appetite, anhedonia, sadness/tearfullness.  Cognitive assessment: the patient manages all their financial and personal affairs and is actively engaged. They could relate day,date,year and events; recalled 2/3 objects at 3 minutes; performed clock-face test normally.  The following portions of the patient's history were reviewed and updated as appropriate: allergies, current medications, past  family history, past medical history,  past surgical history, past social history  and problem list.  Visual acuity was not assessed per patient preference since she has regular follow up with her ophthalmologist. Hearing and body mass index were assessed and reviewed.   During the course of the visit the patient was educated and counseled about appropriate screening and preventive services including : fall prevention , diabetes screening, nutrition counseling, colorectal cancer screening, and recommended immunizations.    CC: The primary encounter diagnosis was Encounter for screening mammogram for malignant neoplasm of breast. Diagnoses of White coat syndrome with high blood pressure but without hypertension, Encounter for preventive health examination, Hyperlipidemia LDL goal <130, Osteopenia of neck of femur, unspecified laterality, and Closed nondisplaced transverse fracture of shaft of right radius with routine healing, subsequent encounter were also pertinent to this visit.  Reviewed labs from [place] from right wrist fracture from a pickle ball fall  Recovering from Lumbar spine surgery   Using progesterone tabs, waiting for the estriol tablets to be refilled.   History Michele Hess has a past medical history of Anxiety, GERD (gastroesophageal reflux disease), History of ETT, HLD (hyperlipidemia), Menopausal and postmenopausal disorder, Osteopenia, Seasonal allergies, Seizure ([place]) (01/2016), and Syncope and collapse (01/27/2016).   She has a past surgical history that includes Partial hysterectomy; echo (other) (04/2009); Total abdominal hysterectomy (1993); Anterior cruciate ligament repair (Left, 2011); Tonsillectomy (1961); Colonoscopy (2019); Polypectomy (2019); Wisdom tooth extraction; Pelvic laparoscopy (7062); and Lumbar laminectomy (10/2020).   Her family history includes Diabetes in her brother  and mother; Heart attack (age of onset: 13) in her mother; Heart  attack (age of onset: 64) in her father; Lung cancer in her brother.She reports that she quit smoking about 34 years ago. Her smoking use included cigarettes. She has a 17.00 pack-year smoking history. She has never used smokeless tobacco. She reports that she does not currently use alcohol after a past usage of about 5.0 standard drinks per week. She reports that she does not use drugs.  Outpatient Medications Prior to Visit  Medication Sig Dispense Refill   calcium carbonate (CALCIUM 600) 600 MG TABS tablet Take 1 tablet (600 mg total) by mouth daily with breakfast. 90 tablet 1   loratadine (CLARITIN) 10 MG tablet Take 10 mg by mouth daily as needed for allergies.     Multiple Vitamins-Minerals (EMERGEN-C VITAMIN C) PACK Take 1 packet by mouth daily as needed.     pravastatin (PRAVACHOL) 20 MG tablet TAKE 1 TABLET BY MOUTH EVERY DAY 90 tablet 1   PROGESTERONE, VAGINAL, 4 % GEL Place 0.5 mLs vaginally. 6 days a week.     Pseudoephedrine HCl (SUDAFED PO) Take 1 tablet by mouth daily as needed.     Facility-Administered Medications Prior to Visit  Medication Dose Route Frequency Provider Last Rate Last Admin   0.9 %  sodium chloride infusion  500 mL Intravenous Once Armbruster, Carlota Raspberry, MD        Review of Systems  Patient denies headache, fevers, malaise, unintentional weight loss, skin rash, eye pain, sinus congestion and sinus pain, sore throat, dysphagia,  hemoptysis , cough, dyspnea, wheezing, chest pain, palpitations, orthopnea, edema, abdominal pain, nausea, melena, diarrhea, constipation, flank pain, dysuria, hematuria, urinary  Frequency, nocturia, numbness, tingling, seizures,  Focal weakness, Loss of consciousness,  Tremor, insomnia, depression, anxiety, and suicidal ideation.     Objective:  BP (!) 148/82 (BP Location: Left Arm, Patient Position: Sitting, Cuff Size: Normal)   Pulse 100   Temp (!) 96 F (35.6 C) (Temporal)   Ht 5\' 3"  (1.6 m)   Wt 136 lb 12.8 oz (62.1 kg)   SpO2  99%   BMI 24.23 kg/m   Physical Exam . General appearance: alert, cooperative and appears stated age Head: Normocephalic, without obvious abnormality, atraumatic Eyes: conjunctivae/corneas clear. PERRL, EOM's intact. Fundi benign. Ears: normal TM's and external ear canals both ears Nose: Nares normal. Septum midline. Mucosa normal. No drainage or sinus tenderness. Throat: lips, mucosa, and tongue normal; teeth and gums normal Neck: no adenopathy, no carotid bruit, no JVD, supple, symmetrical, trachea midline and thyroid not enlarged, symmetric, no tenderness/mass/nodules Lungs: clear to auscultation bilaterally Breasts: normal appearance, no masses or tenderness Heart: regular rate and rhythm, S1, S2 normal, no murmur, click, rub or gallop Abdomen: soft, non-tender; bowel sounds normal; no masses,  no organomegaly Extremities: extremities normal, atraumatic, no cyanosis or edema Pulses: 2+ and symmetric Skin: Skin color, texture, turgor normal. No rashes or lesions Neurologic: Alert and oriented X 3, normal strength and tone. Normal symmetric reflexes. Normal coordination and gait.     Assessment & Plan:   Problem List Items Addressed This Visit     Hyperlipidemia LDL goal <130    Lipids at  goal on  pravastatin  And LFTS are normal. No changes today    Lab Results  Component Value Date   CHOL 206 (H) 05/19/2021   HDL 80.20 05/19/2021   LDLCALC 106 (H) 05/19/2021   LDLDIRECT 137.0 06/05/2015   TRIG 99.0 05/19/2021  CHOLHDL 3 05/19/2021   Lab Results  Component Value Date   ALT 12 05/19/2021   AST 27 05/19/2021   ALKPHOS 68 05/19/2021   BILITOT 0.4 05/19/2021        Osteopenia    Repeat DEXA needed given recent radial fracture.  Last dexa was in 2014      Relevant Orders   DG Bone Density   White coat syndrome with high blood pressure but without hypertension    She has no history of hypertension but has had  mild elevations in the office.   She haso checked  her  pressures at home and reading have been 120/70 or less .  Renal function is normal .  Lab Results  Component Value Date   CREATININE 0.66 05/19/2021   Lab Results  Component Value Date   NA 136 05/19/2021   K 4.1 05/19/2021   CL 101 05/19/2021   CO2 27 05/19/2021         Relevant Orders   Microalbumin / creatinine urine ratio (Completed)   Comprehensive metabolic panel   Encounter for preventive health examination    age appropriate education and counseling updated, referrals for preventative services and immunizations addressed, dietary and smoking counseling addressed, most recent labs reviewed.  I have personally reviewed and have noted:   1) the patient's medical and social history 2) The pt's use of alcohol, tobacco, and illicit drugs 3) The patient's current medications and supplements 4) Functional ability including ADL's, fall risk, home safety risk, hearing and visual impairment 5) Diet and physical activities 6) Evidence for depression or mood disorder 7) The patient's height, weight, and BMI have been recorded in the chart   I have made referrals, and provided counseling and education based on review of the above      Other Visit Diagnoses     Encounter for screening mammogram for malignant neoplasm of breast    -  Primary   Relevant Orders   MM 3D SCREEN BREAST BILATERAL   Closed nondisplaced transverse fracture of shaft of right radius with routine healing, subsequent encounter           I am having Michele Hess maintain her Emergen-C Vitamin C, PROGESTERONE (VAGINAL), calcium carbonate, loratadine, Pseudoephedrine HCl (SUDAFED PO), and pravastatin. We will continue to administer sodium chloride.  No orders of the defined types were placed in this encounter.   There are no discontinued medications.  Follow-up: Return in about 1 year (around 05/21/2022).   Crecencio Mc, MD

## 2021-05-21 NOTE — Patient Instructions (Addendum)
Your vitamin D  thyroid ,cholesterol,  liver and kidney function are normal or at goal.       Please check BP  5 times over the next 2 weeks and send me readings   You need a CMET (metabolic panel ) in 6 months    I recommend getting the majority of your calcium and Vitamin D  through diet rather than supplements given the recent association of calcium supplements with increased coronary artery calcium scores (You need 1200 to 1800 mg of calcium daily )   Unsweetened almond/coconut milk and soy milk are low carb, cholesterol free  ways to increase your dietary calcium and vitamin D.   Vitamin D intake should be 1000 to 2000 Ius  Daily unless otherwise instructed

## 2021-05-22 NOTE — Assessment & Plan Note (Addendum)
Repeat DEXA has been done ,  No change form prior

## 2021-05-22 NOTE — Assessment & Plan Note (Signed)

## 2021-05-22 NOTE — Assessment & Plan Note (Signed)
She has no history of hypertension but has had  mild elevations in the office.   She haso checked  her pressures at home and reading have been 120/70 or less .  Renal function is normal .  Lab Results  Component Value Date   CREATININE 0.66 05/19/2021   Lab Results  Component Value Date   NA 136 05/19/2021   K 4.1 05/19/2021   CL 101 05/19/2021   CO2 27 05/19/2021

## 2021-05-22 NOTE — Assessment & Plan Note (Signed)
Lipids at  goal on  pravastatin  And LFTS are normal. No changes today    Lab Results  Component Value Date   CHOL 206 (H) 05/19/2021   HDL 80.20 05/19/2021   LDLCALC 106 (H) 05/19/2021   LDLDIRECT 137.0 06/05/2015   TRIG 99.0 05/19/2021   CHOLHDL 3 05/19/2021   Lab Results  Component Value Date   ALT 12 05/19/2021   AST 27 05/19/2021   ALKPHOS 68 05/19/2021   BILITOT 0.4 05/19/2021

## 2021-05-23 NOTE — Addendum Note (Signed)
Addended by: Crecencio Mc on: 05/23/2021 08:22 AM   Modules accepted: Orders

## 2021-05-29 ENCOUNTER — Other Ambulatory Visit: Payer: Self-pay

## 2021-07-23 ENCOUNTER — Ambulatory Visit
Admission: RE | Admit: 2021-07-23 | Discharge: 2021-07-23 | Disposition: A | Discharge status: Home / Self Care | Payer: BC Managed Care – PPO | Source: Ambulatory Visit | Attending: Internal Medicine | Admitting: Internal Medicine

## 2021-07-23 ENCOUNTER — Other Ambulatory Visit: Payer: Self-pay

## 2021-07-23 DIAGNOSIS — Z1231 Encounter for screening mammogram for malignant neoplasm of breast: Secondary | ICD-10-CM | POA: Diagnosis not present

## 2021-08-03 DIAGNOSIS — I10 Essential (primary) hypertension: Secondary | ICD-10-CM

## 2021-08-03 HISTORY — DX: Essential (primary) hypertension: I10

## 2021-10-17 ENCOUNTER — Telehealth: Payer: Self-pay | Admitting: Internal Medicine

## 2021-10-17 DIAGNOSIS — R7301 Impaired fasting glucose: Secondary | ICD-10-CM

## 2021-10-17 DIAGNOSIS — E785 Hyperlipidemia, unspecified: Secondary | ICD-10-CM

## 2021-10-17 DIAGNOSIS — R030 Elevated blood-pressure reading, without diagnosis of hypertension: Secondary | ICD-10-CM

## 2021-10-17 MED ORDER — AMLODIPINE BESYLATE 2.5 MG PO TABS: 2.5000 mg | ORAL_TABLET | Freq: Every day | ORAL | 0 refills | Status: DC

## 2021-10-17 NOTE — Telephone Encounter (Signed)
Pt called in requesting for lab orders... No lab orders have been placed... Pt requesting to have lab done before her appt next week. Pt is schedule on 10/24/2021 at 10:30am... Pt would like to check her A1C as well... Pt requesting callback...  ?

## 2021-10-17 NOTE — Telephone Encounter (Signed)
Pt would like to have labs done prior to her appt on 10/24/2021 for elevated bp. I have ordered CMP, Microalbumin, Lipid panel and A1c per pt request. Is there anything else that needs to be ordered?  ?

## 2021-10-17 NOTE — Telephone Encounter (Signed)
Spoke with pt and she stated that her blood pressure has been fluctuating up and down but today has been the worse. 136/93 at 7:30 am, 149/103 at 10:00 am, and 170/111 at 2:15 pm. Pt stated that she is under some stress right now because they are selling their house in [place] and moving back to [place]. Pt has an appt scheduled for 10/24/2021 and labs prior to that.   ?

## 2021-10-17 NOTE — Telephone Encounter (Signed)
Medication has been sent to pharmacy and pt is aware.  ?

## 2021-10-17 NOTE — Telephone Encounter (Signed)
Her her start amlodipine 2.5 mg daily  I ordered but   Did not send to pharmacy .  Please determine where to send it  ?

## 2021-10-17 NOTE — Telephone Encounter (Signed)
Pt has questions about her BP and would like someone to call her back ?

## 2021-10-22 ENCOUNTER — Other Ambulatory Visit: Payer: Self-pay

## 2021-10-22 ENCOUNTER — Other Ambulatory Visit (INDEPENDENT_AMBULATORY_CARE_PROVIDER_SITE_OTHER): Payer: BC Managed Care – PPO

## 2021-10-22 DIAGNOSIS — R030 Elevated blood-pressure reading, without diagnosis of hypertension: Secondary | ICD-10-CM | POA: Diagnosis not present

## 2021-10-22 DIAGNOSIS — E785 Hyperlipidemia, unspecified: Secondary | ICD-10-CM | POA: Diagnosis not present

## 2021-10-22 DIAGNOSIS — R7301 Impaired fasting glucose: Secondary | ICD-10-CM | POA: Diagnosis not present

## 2021-10-22 LAB — COMPREHENSIVE METABOLIC PANEL
ALT: 8 U/L (ref 0–35)
AST: 18 U/L (ref 0–37)
Albumin: 4.6 g/dL (ref 3.5–5.2)
Alkaline Phosphatase: 54 U/L (ref 39–117)
BUN: 16 mg/dL (ref 6–23)
CO2: 27 mEq/L (ref 19–32)
Calcium: 9.3 mg/dL (ref 8.4–10.5)
Chloride: 102 mEq/L (ref 96–112)
Creatinine, Ser: 0.59 mg/dL (ref 0.40–1.20)
GFR: 94.97 mL/min (ref >60.00)
Glucose, Bld: 96 mg/dL (ref 70–99)
Potassium: 4.2 mEq/L (ref 3.5–5.1)
Sodium: 138 mEq/L (ref 135–145)
Total Bilirubin: 0.5 mg/dL (ref 0.2–1.2)
Total Protein: 7.3 g/dL (ref 6.0–8.3)

## 2021-10-22 LAB — MICROALBUMIN / CREATININE URINE RATIO
Creatinine,U: 66 mg/dL
Microalb Creat Ratio: 1.3 mg/g (ref 0.0–30.0)
Microalb, Ur: 0.9 mg/dL (ref 0.0–1.9)

## 2021-10-22 LAB — LIPID PANEL
Cholesterol: 200 mg/dL (ref 0–200)
HDL: 81.7 mg/dL (ref >39.00)
LDL Cholesterol: 102 mg/dL — ABNORMAL HIGH (ref 0–99)
NonHDL: 118.36
Total CHOL/HDL Ratio: 2
Triglycerides: 81 mg/dL (ref 0.0–149.0)
VLDL: 16.2 mg/dL (ref 0.0–40.0)

## 2021-10-22 LAB — HEMOGLOBIN A1C: Hgb A1c MFr Bld: 5.9 % (ref 4.6–6.5)

## 2021-10-24 ENCOUNTER — Telehealth: Payer: Self-pay | Admitting: Internal Medicine

## 2021-10-24 ENCOUNTER — Ambulatory Visit (INDEPENDENT_AMBULATORY_CARE_PROVIDER_SITE_OTHER): Payer: BC Managed Care – PPO | Admitting: Internal Medicine

## 2021-10-24 ENCOUNTER — Encounter: Payer: Self-pay | Admitting: Internal Medicine

## 2021-10-24 ENCOUNTER — Other Ambulatory Visit: Payer: Self-pay

## 2021-10-24 VITALS — BP 160/90 | HR 92 | Temp 98.0°F | Ht 63.0 in | Wt 135.6 lb

## 2021-10-24 DIAGNOSIS — R030 Elevated blood-pressure reading, without diagnosis of hypertension: Secondary | ICD-10-CM | POA: Diagnosis not present

## 2021-10-24 DIAGNOSIS — E785 Hyperlipidemia, unspecified: Secondary | ICD-10-CM

## 2021-10-24 DIAGNOSIS — Z1329 Encounter for screening for other suspected endocrine disorder: Secondary | ICD-10-CM | POA: Diagnosis not present

## 2021-10-24 DIAGNOSIS — R7301 Impaired fasting glucose: Secondary | ICD-10-CM

## 2021-10-24 NOTE — Patient Instructions (Addendum)
Continue amlodipine 2.5 mg at night  for now .  Ok to take with pravastatin. ? ? Check BP once daily  or no more frequently than 12 hours apart ? ?Stop amlodipine when readings are all < 120/70. Your machine reads higher than ours  so take 15 pts off the readings you are getting at home on your current [employer] machine   ? ?Schedule your "Welcome to Medicare"  exam  this summer ?

## 2021-10-24 NOTE — Progress Notes (Signed)
? ?Subjective:  ?Patient ID: Michele Hess, female    DOB: 03-01-1956  Age: 66 y.o. MRN: [id number] ? ?CC: There were no encounter diagnoses. ? ? ?This visit occurred during the SARS-CoV-2 public health emergency.  Safety protocols were in place, including screening questions prior to the visit, additional usage of staff PPE, and extensive cleaning of exam room while observing appropriate contact time as indicated for disinfecting solutions.   ? ?HPI ?Michele Hess presents for follow up on hypertension managed with amlodipine 2.5 mg daily since March 17.  Home readings reviewed  ?Chief Complaint  ?Patient presents with  ? Follow-up  ?  Follow up on elevated blood pressure. Pt was started on Amlodipine 2.5 mg.   ? ?Prior to relocating,  in the fall,  the readings were all normal  (<120/70)  ? ?Relocated back from [place] several days ago..  recently called on March 17 with 3 readings ALL FROM THE SAME DAY) that were > 130/80. Has been under a lot of financial stress  due to the move,  selling the house.  ? ?She also notes that readings are high at doctor's office .  ? ?HLD :  taking pravastatin at night  ? ?Taking MVI and calcium once daily and a probiotic for women . Working on losing the ten lbs  ? ? ?Outpatient Medications Prior to Visit  ?Medication Sig Dispense Refill  ? amLODipine (NORVASC) 2.5 MG tablet Take 1 tablet (2.5 mg total) by mouth daily. 90 tablet 0  ? calcium carbonate (CALCIUM 600) 600 MG TABS tablet Take 1 tablet (600 mg total) by mouth daily with breakfast. 90 tablet 1  ? loratadine (CLARITIN) 10 MG tablet Take 10 mg by mouth daily as needed for allergies.    ? Multiple Vitamins-Minerals (EMERGEN-C VITAMIN C) PACK Take 1 packet by mouth daily as needed.    ? pravastatin (PRAVACHOL) 20 MG tablet TAKE 1 TABLET BY MOUTH EVERY DAY 90 tablet 1  ? PROGESTERONE, VAGINAL, 4 % GEL Place 0.5 mLs vaginally. 6 days a week.    ? Pseudoephedrine HCl (SUDAFED PO) Take 1 tablet by mouth daily as needed.     ? ?Facility-Administered Medications Prior to Visit  ?Medication Dose Route Frequency Provider Last Rate Last Admin  ? 0.9 %  sodium chloride infusion  500 mL Intravenous Once Armbruster, Carlota Raspberry, MD      ? ? ?Review of Systems; ? ?Patient denies headache, fevers, malaise, unintentional weight loss, skin rash, eye pain, sinus congestion and sinus pain, sore throat, dysphagia,  hemoptysis , cough, dyspnea, wheezing, chest pain, palpitations, orthopnea, edema, abdominal pain, nausea, melena, diarrhea, constipation, flank pain, dysuria, hematuria, urinary  Frequency, nocturia, numbness, tingling, seizures,  Focal weakness, Loss of consciousness,  Tremor, insomnia, depression, anxiety, and suicidal ideation.   ? ? ? ?Objective:  ?There were no vitals taken for this visit. ? ?BP Readings from Last 3 Encounters:  ?05/21/21 (!) 148/82  ?03/24/21 113/72  ?10/30/19 122/82  ? ? ?Wt Readings from Last 3 Encounters:  ?05/21/21 136 lb 12.8 oz (62.1 kg)  ?03/24/21 135 lb (61.2 kg)  ?03/10/21 135 lb (61.2 kg)  ? ? ?General appearance: alert, cooperative and appears stated age ?Ears: normal TM's and external ear canals both ears ?Throat: lips, mucosa, and tongue normal; teeth and gums normal ?Neck: no adenopathy, no carotid bruit, supple, symmetrical, trachea midline and thyroid not enlarged, symmetric, no tenderness/mass/nodules ?Back: symmetric, no curvature. ROM normal. No CVA tenderness. ?Lungs: clear to auscultation  bilaterally ?Heart: regular rate and rhythm, S1, S2 normal, no murmur, click, rub or gallop ?Abdomen: soft, non-tender; bowel sounds normal; no masses,  no organomegaly ?Pulses: 2+ and symmetric ?Skin: Skin color, texture, turgor normal. No rashes or lesions ?Lymph nodes: Cervical, supraclavicular, and axillary nodes normal. ? ?Lab Results  ?Component Value Date  ? HGBA1C 5.9 10/22/2021  ? HGBA1C 5.7 05/19/2021  ? HGBA1C 5.7 10/27/2019  ? ? ?Lab Results  ?Component Value Date  ? CREATININE 0.59 10/22/2021  ?  CREATININE 0.66 05/19/2021  ? CREATININE 0.6 10/03/2020  ? ? ?Lab Results  ?Component Value Date  ? WBC 5.0 05/19/2021  ? HGB 13.4 05/19/2021  ? HCT 40.9 05/19/2021  ? PLT 363.0 05/19/2021  ? GLUCOSE 96 10/22/2021  ? CHOL 200 10/22/2021  ? TRIG 81.0 10/22/2021  ? HDL 81.70 10/22/2021  ? LDLDIRECT 137.0 06/05/2015  ? LDLCALC 102 (H) 10/22/2021  ? ALT 8 10/22/2021  ? AST 18 10/22/2021  ? NA 138 10/22/2021  ? K 4.2 10/22/2021  ? CL 102 10/22/2021  ? CREATININE 0.59 10/22/2021  ? BUN 16 10/22/2021  ? CO2 27 10/22/2021  ? TSH 2.02 05/19/2021  ? HGBA1C 5.9 10/22/2021  ? MICROALBUR 0.9 10/22/2021  ? ? ?MM 3D SCREEN BREAST BILATERAL ? ?Result Date: 07/24/2021 ?CLINICAL DATA:  Screening. EXAM: DIGITAL SCREENING BILATERAL MAMMOGRAM WITH TOMOSYNTHESIS AND CAD TECHNIQUE: Bilateral screening digital craniocaudal and mediolateral oblique mammograms were obtained. Bilateral screening digital breast tomosynthesis was performed. The images were evaluated with computer-aided detection. COMPARISON:  Previous exam(s). ACR Breast Density Category b: There are scattered areas of fibroglandular density. FINDINGS: There are no findings suspicious for malignancy. IMPRESSION: No mammographic evidence of malignancy. A result letter of this screening mammogram will be mailed directly to the patient. RECOMMENDATION: Screening mammogram in one year. (Code:SM-B-01Y) BI-RADS CATEGORY  1: Negative. Electronically Signed   By: Abelardo Diesel M.D.   On: 07/24/2021 07:19 ? ? ?Assessment & Plan:  ? ?Problem List Items Addressed This Visit   ?None ? ? ?I spent 30 minutes dedicated to the care of this patient on the date of this encounter to include pre-visit review of patient's medical history,  most recent imaging studies, Face-to-face time with the patient , and post visit ordering of testing and therapeutics.   ? ?Follow-up: No follow-ups on file. ? ? ?Crecencio Mc, MD ?

## 2021-10-24 NOTE — Telephone Encounter (Signed)
Pt is transferring over to Welcome To Medicare.Marland Kitchen  ?

## 2021-10-25 NOTE — Assessment & Plan Note (Signed)
Lipids at  goal on  pravastatin  And LFTS are normal. No changes today    Lab Results  Component Value Date   CHOL 200 10/22/2021   HDL 81.70 10/22/2021   LDLCALC 102 (H) 10/22/2021   LDLDIRECT 137.0 06/05/2015   TRIG 81.0 10/22/2021   CHOLHDL 2 10/22/2021   Lab Results  Component Value Date   ALT 8 10/22/2021   AST 18 10/22/2021   ALKPHOS 54 10/22/2021   BILITOT 0.5 10/22/2021    

## 2021-10-25 NOTE — Assessment & Plan Note (Addendum)
Advised that her home machine is reading 15-20 pts higher than manual readings repeated by me.  Continue 2.5 mg amldopine for now and suspend when adjusted readings are < 120/70 on consecutive days  ?

## 2021-11-07 ENCOUNTER — Encounter: Payer: Self-pay | Admitting: Internal Medicine

## 2021-11-10 ENCOUNTER — Encounter: Payer: Self-pay | Admitting: Internal Medicine

## 2021-11-13 ENCOUNTER — Other Ambulatory Visit: Payer: Self-pay | Admitting: Internal Medicine

## 2021-11-17 ENCOUNTER — Other Ambulatory Visit: Payer: Self-pay

## 2021-11-18 MED ORDER — PROGESTERONE 4 % VA GEL: VAGINAL | 11 refills | Status: DC

## 2021-11-18 NOTE — Telephone Encounter (Signed)
Michele Hess from [facility] has questions about the medication PROGESTERONE, VAGINAL ?[phone] ?

## 2021-11-18 NOTE — Telephone Encounter (Signed)
Please ask the pharmacy to send a fax to the office for the estrogen tab refill . It is not in your chart so I can't order it the usual way ? ?The progesterone liquid has been refilled  ?

## 2021-11-19 ENCOUNTER — Telehealth: Payer: Self-pay | Admitting: Internal Medicine

## 2021-11-19 NOTE — Telephone Encounter (Signed)
Seth Bake From [facility] called in stating to disregard refill request for prescriptions for pt... Seth Bake stated it was sent by accident...  ?

## 2021-11-20 NOTE — Telephone Encounter (Signed)
I have placed the rx's in quick sign folder for signature.  ?

## 2021-11-21 NOTE — Telephone Encounter (Addendum)
Seth Bake from [facility] called stating the prescription came back as vaginal and pt been getting topical. Seth Bake stated the dose is the same but the quantity is  different  ?[phone] ?

## 2021-11-24 NOTE — Telephone Encounter (Signed)
Rx sent 

## 2021-11-25 NOTE — Telephone Encounter (Signed)
Verbals were given to [facility] yesterday afternoon.  ?

## 2021-11-27 NOTE — Telephone Encounter (Signed)
Welcome to medicare 02/02/2022 I can assist if you need help. ?

## 2021-12-31 ENCOUNTER — Encounter: Payer: Self-pay | Admitting: Internal Medicine

## 2022-01-01 MED ORDER — AMLODIPINE BESYLATE 2.5 MG PO TABS: 2.5000 mg | ORAL_TABLET | Freq: Every day | ORAL | 3 refills | Status: DC

## 2022-01-19 ENCOUNTER — Encounter: Payer: BC Managed Care – PPO | Admitting: Internal Medicine

## 2022-02-02 ENCOUNTER — Encounter: Payer: Self-pay | Admitting: Internal Medicine

## 2022-02-02 ENCOUNTER — Ambulatory Visit: Payer: Medicare PPO | Admitting: Internal Medicine

## 2022-02-02 VITALS — BP 112/74 | HR 86 | Temp 98.0°F | Ht 63.0 in | Wt 138.8 lb

## 2022-02-02 DIAGNOSIS — E785 Hyperlipidemia, unspecified: Secondary | ICD-10-CM

## 2022-02-02 DIAGNOSIS — I10 Essential (primary) hypertension: Secondary | ICD-10-CM | POA: Diagnosis not present

## 2022-02-02 DIAGNOSIS — Z0000 Encounter for general adult medical examination without abnormal findings: Secondary | ICD-10-CM

## 2022-02-02 DIAGNOSIS — Z23 Encounter for immunization: Secondary | ICD-10-CM

## 2022-02-02 DIAGNOSIS — Z9289 Personal history of other medical treatment: Secondary | ICD-10-CM

## 2022-02-02 DIAGNOSIS — Z8249 Family history of ischemic heart disease and other diseases of the circulatory system: Secondary | ICD-10-CM | POA: Diagnosis not present

## 2022-02-02 NOTE — Assessment & Plan Note (Signed)
Normal August 2017,  Done for workup of chest pain now attributed to GERD.  Her brother has recently had a 4 vessel CABG at the age of 45 and she is concerned that she may have developed occult disease.  She denies exertional dyspnea,  Chest pain at rest and with exertion.

## 2022-02-02 NOTE — Patient Instructions (Addendum)
Given your Family history  of  (not early , however) heart disease,  I recommend a referral to cardiology to assess your risk  . This may include  some form of testing .  You received the Prevnar 20 pneumonia vaccine today

## 2022-02-02 NOTE — Assessment & Plan Note (Signed)
Lipids at  goal on  pravastatin  And LFTS are normal. No changes today    Lab Results  Component Value Date   CHOL 200 10/22/2021   HDL 81.70 10/22/2021   LDLCALC 102 (H) 10/22/2021   LDLDIRECT 137.0 06/05/2015   TRIG 81.0 10/22/2021   CHOLHDL 2 10/22/2021   Lab Results  Component Value Date   ALT 8 10/22/2021   AST 18 10/22/2021   ALKPHOS 54 10/22/2021   BILITOT 0.5 10/22/2021

## 2022-02-02 NOTE — Assessment & Plan Note (Signed)

## 2022-02-02 NOTE — Progress Notes (Signed)
The patient is here for the Welcome to  Medicare  preventive visit     has a past medical history of Anxiety, GERD (gastroesophageal reflux disease), History of ETT, HLD (hyperlipidemia), Menopausal and postmenopausal disorder, Osteopenia, Seasonal allergies, Seizure ([place]) (01/2016), and Syncope and collapse (01/27/2016).    reports that she quit smoking about 34 years ago. Her smoking use included cigarettes. She has a 17.00 pack-year smoking history. She has never used smokeless tobacco. She reports that she does not currently use alcohol after a past usage of about 5.0 standard drinks of alcohol per week. She reports that she does not use drugs.   The roster of all physicians providing medical care to patient : Patient Care Team: Crecencio Mc, MD as PCP - General (Internal Medicine)  Activities of daily living:  The patient is 100% independent in all ADLs: dressing, toileting, feeding as well as independent mobility Fall risk was assessed by direct patient evaluation of patient's balance, gait and ability to risk from a chair and from a kneeling position. Home safety : The patient has smoke detectors in the home. They wear seatbelts.  There are no firearms at home. There is no violence in the home.  Patient has seen their eye doctor in the last year.   Visual acuity was assessed today  and was 20/20 with correction lenses. Patient denies hearing difficulty with regard to whispered voices and some television programs and has  deferred audiologic testing in the last year.   There is no risks for hepatitis, STDs or HIV. There is no   history of blood transfusion. They have no travel history to infectious disease endemic areas of the world.  The patient has seen their dentist in the last six month.  They do not  have excessive sun exposure. Discussed the need for sun protection: hats, long sleeves and use of sunscreen if there is significant sun exposure.   Diet: the importance of a healthy  diet is discussed. They do have a healthy diet.  The benefits of regular aerobic exercise were discussed. Patient exercises  3 to 5 times weekly using aeroobics and yoga.   Depression screen:      02/02/2022    8:58 AM 10/24/2021   10:35 AM 05/21/2021   10:12 AM 10/30/2019   11:38 AM 08/19/2017   11:07 AM  Depression screen PHQ 2/9  Decreased Interest 0 0 0 0 0  Down, Depressed, Hopeless 0 0 0 0 0  PHQ - 2 Score 0 0 0 0 0  Altered sleeping     1  Tired, decreased energy     0  Change in appetite     0  Feeling bad or failure about yourself      0  Trouble concentrating     0  Moving slowly or fidgety/restless     0  Suicidal thoughts     0  PHQ-9 Score     1  Difficult doing work/chores     Not difficult at all       Cognitive assessment: the patient manages all their financial and personal affairs and is actively engaged. They could relate day,date,year and events; recalled 2/3 objects at 3 minutes; performed clock-face test normally.  The following portions of the patient's history were reviewed and updated as appropriate: allergies, current medications, past family history, past medical history,  past surgical history, past social history  and problem list.  During the course of the visit the patient  was educated and counseled about appropriate screening and preventive services including : fall prevention , diabetes screening, nutrition counseling, colorectal cancer screening, and recommended immunizations   Immunization History  Administered Date(s) Administered   Hepatitis A, Adult 10/17/2014, 04/30/2015   Hepatitis B 04/19/1997, 05/17/1997, 10/18/1997   Influenza Split 05/05/2011, 05/03/2013, 05/30/2014   Influenza,inj,Quad PF,6+ Mos 06/12/2015, 05/09/2018, 04/05/2019, 04/19/2020, 04/15/2021   Influenza-Unspecified 04/22/2016, 05/05/2017   MMR 09/11/2015, 10/10/2015   Moderna Sars-Covid-2 Vaccination 10/18/2019, 11/15/2019, 06/03/2020, 03/27/2021   PNEUMOCOCCAL  CONJUGATE-20 02/02/2022   Td 10/07/2018   Tdap 09/04/2008   Zoster Recombinat (Shingrix) 07/10/2019, 10/02/2019  HMLISTPATIENTFRIENDLY@ Health Maintenance Due  Topic Date Due   COVID-19 Vaccine (5 - Moderna series) 05/22/2021   PAP SMEAR-Modifier  10/06/2021    Last skin cancer screening : Nov 2022.  No skin cancers   HAS RESUMED exercise after slight weight gain during a ten day cruise to [place].  Resuming exercise has aggravated her lower back issues (sp laminectomy)  which resolves if she stretches appropriately   Taking claritin prn,  suggested using it every daily    Vital Signs: BP 112/74 (BP Location: Left Arm, Patient Position: Sitting, Cuff Size: Normal)   Pulse 86   Temp 98 F (36.7 C) (Oral)   Ht '5\' 3"'  (1.6 m)   Wt 138 lb 12.8 oz (63 kg)   SpO2 98%   BMI 24.59 kg/m    Exam: General appearance: alert, cooperative and appears stated age Head: Normocephalic, without obvious abnormality, atraumatic Eyes: conjunctivae/corneas clear. PERRL, EOM's intact. Fundi benign. Ears: normal TM's and external ear canals both ears Nose: Nares normal. Septum midline. Mucosa normal. No drainage or sinus tenderness. Throat: lips, mucosa, and tongue normal; teeth and gums normal Neck: no adenopathy, no carotid bruit, no JVD, supple, symmetrical, trachea midline and thyroid not enlarged, symmetric, no tenderness/mass/nodules Lungs: clear to auscultation bilaterally Breasts: normal appearance, no masses or tenderness Heart: regular rate and rhythm, S1, S2 normal, no murmur, click, rub or gallop Abdomen: soft, non-tender; bowel sounds normal; no masses,  no organomegaly Extremities: extremities normal, atraumatic, no cyanosis or edema Pulses: 2+ and symmetric Skin: Skin color, texture, turgor normal. No rashes or lesions Neurologic: Alert and oriented X 3, normal strength and tone. Normal symmetric reflexes. Normal coordination and gait.     End of Life Discussion and Planning    During the course of the visit , End of Life objectives were discussed at length.  Patient does have a living will in place and  a healthcare power of attorney.  Reviewed the difference between Living wills and DNR orders   Review of Opioid Prescriptions    Patient does not have a current opioid prescription Patient's risk factors for opioid use disorder was reviewed and includes NO ISSUES  Treatment of pain using non-opioid alternatives was reviewed   Patient risk for potential substance abuse was assessed and addressed with counselling.    Assessment and Plan  White coat syndrome with hypertension HOME READINGS HAVE BEEN > 140/90  ON AMLODIPINE 2.5 MG DAILY USING HER   NEW OMRON MACHINE  WHICH HAS BEEN CALIBRATED TODAY AND FOUND TO BE ACCURATE.   HOME  READING TODAY WAS 112/74    Continue low dose amlodipine   Encounter for preventive health examination age appropriate education and counseling updated, referrals for preventative services and immunizations addressed, dietary and smoking counseling addressed, most recent labs reviewed.  I have personally reviewed and have noted:   1) the patient's medical  and social history 2) The pt's use of alcohol, tobacco, and illicit drugs 3) The patient's current medications and supplements 4) Functional ability including ADL's, fall risk, home safety risk, hearing and visual impairment 5) Diet and physical activities 6) Evidence for depression or mood disorder 7) The patient's height, weight, and BMI have been recorded in the chart 8) I have ordered and reviewed a 12 lead EKG and find that there are no acute changes and patient is in sinus rhythm.     I have made referrals, and provided counseling and education based on review of the above  History of nuclear stress test Normal August 2017,  Done for workup of chest pain now attributed to GERD.  Her brother has recently had a 4 vessel CABG at the age of 2 and she is concerned that she may  have developed occult disease.  She denies exertional dyspnea,  Chest pain at rest and with exertion.   Family history of early CAD Not early,  (brother was 77) , but she requests cardiology consult for risk stratification. Referral in progress  Hyperlipidemia LDL goal <130 Lipids at  goal on  pravastatin  And LFTS are normal. No changes today    Lab Results  Component Value Date   CHOL 200 10/22/2021   HDL 81.70 10/22/2021   LDLCALC 102 (H) 10/22/2021   LDLDIRECT 137.0 06/05/2015   TRIG 81.0 10/22/2021   CHOLHDL 2 10/22/2021   Lab Results  Component Value Date   ALT 8 10/22/2021   AST 18 10/22/2021   ALKPHOS 54 10/22/2021   BILITOT 0.5 10/22/2021    Updated Medication List Outpatient Encounter Medications as of 02/02/2022  Medication Sig   amLODipine (NORVASC) 2.5 MG tablet Take 1 tablet (2.5 mg total) by mouth daily.   calcium carbonate (CALCIUM 600) 600 MG TABS tablet Take 1 tablet (600 mg total) by mouth daily with breakfast.   Lactobacillus (PROBIOTIC ACIDOPHILUS PO) Take 1 capsule by mouth daily.   loratadine (CLARITIN) 10 MG tablet Take 10 mg by mouth daily as needed for allergies.   Multiple Vitamins-Minerals (EMERGEN-C VITAMIN C) PACK Take 1 packet by mouth daily as needed.   pravastatin (PRAVACHOL) 20 MG tablet TAKE 1 TABLET BY MOUTH EVERY DAY   PROGESTERONE, VAGINAL, 4 % GEL Place 0. 5  ml  intravaginally 6 days a week.   [DISCONTINUED] Pseudoephedrine HCl (SUDAFED PO) Take 1 tablet by mouth daily as needed. (Patient not taking: Reported on 10/24/2021)   Facility-Administered Encounter Medications as of 02/02/2022  Medication   0.9 %  sodium chloride infusion

## 2022-02-02 NOTE — Assessment & Plan Note (Addendum)
HOME READINGS HAVE BEEN > 140/90  ON AMLODIPINE 2.5 MG DAILY USING HER   NEW OMRON MACHINE  WHICH HAS BEEN CALIBRATED TODAY AND FOUND TO BE ACCURATE.   HOME  READING TODAY WAS 112/74    Continue low dose amlodipine

## 2022-02-02 NOTE — Assessment & Plan Note (Signed)
Not early,  (brother was 51) , but she requests cardiology consult for risk stratification. Referral in progress

## 2022-02-15 ENCOUNTER — Encounter: Payer: Self-pay | Admitting: Internal Medicine

## 2022-04-02 ENCOUNTER — Ambulatory Visit: Payer: Medicare PPO | Admitting: Internal Medicine

## 2022-04-09 NOTE — Progress Notes (Addendum)
Cardiology Office Note  Date:  04/10/2022   ID:  Michele Hess, DOB 11/18/1955, MRN [id number]  PCP:  Crecencio Mc, MD   Chief Complaint  Patient presents with   New Patient (Initial Visit)    Ref by Dr. Derrel Nip for family Hx of early CAD & PMH of HTN & hyperlipidemia. Medications reviewed by the patient verbally.     HPI:  [name] is a 66 yo woman with PMH of HTN hyperlipidemia Who presents by referral from Dr. Derrel Nip for consultation of her Family history of early CAD  On discussion today reports that she feels well Denies anginal symptoms No significant shortness of breath which is pain on exertion concerning for angina Not exercising currently to the degree that she has in the past  Remote history of near syncope/syncope 2017 Got off cruise ship, had syncope episode of feeling of closing in or tunnel vision Myoview in 2017: Low risk scan  Lab work reviewed A1c 5.9 Total cholesterol 200 LDL 102  EKG personally reviewed by myself on todays visit Nsr rate 89 bpm, no St or Twave changes  Family hx of CAD mother/father, brothers   PMH:   has a past medical history of Anxiety, GERD (gastroesophageal reflux disease), History of ETT, HLD (hyperlipidemia), Menopausal and postmenopausal disorder, Osteopenia, Seasonal allergies, Seizure ([place]) (01/2016), and Syncope and collapse (01/27/2016).  PSH:    Past Surgical History:  Procedure Laterality Date   ANTERIOR CRUCIATE LIGAMENT REPAIR Left 2011   COLONOSCOPY  2019   SA-MAC-prep good-TA's-recall 3 yrs   echo (other)  04/2009   EF 60%, lv normal, RV normal, mild MR, trivial pericardial effusion    LUMBAR LAMINECTOMY  10/2020   PARTIAL HYSTERECTOMY     PELVIC LAPAROSCOPY  1987   POLYPECTOMY  2019   TA's   TONSILLECTOMY  1961   TOTAL ABDOMINAL HYSTERECTOMY  1993   [name]   WISDOM TOOTH EXTRACTION      Current Outpatient Medications  Medication Sig Dispense Refill   amLODipine (NORVASC) 2.5 MG tablet Take 1  tablet (2.5 mg total) by mouth daily. 90 tablet 3   calcium carbonate (CALCIUM 600) 600 MG TABS tablet Take 1 tablet (600 mg total) by mouth daily with breakfast. 90 tablet 1   Lactobacillus (PROBIOTIC ACIDOPHILUS PO) Take 1 capsule by mouth daily.     loratadine (CLARITIN) 10 MG tablet Take 10 mg by mouth daily as needed for allergies.     Multiple Vitamins-Minerals (EMERGEN-C VITAMIN C) PACK Take 1 packet by mouth daily as needed.     pravastatin (PRAVACHOL) 20 MG tablet TAKE 1 TABLET BY MOUTH EVERY DAY 90 tablet 1   PROGESTERONE, VAGINAL, 4 % GEL Place 0. 5  ml  intravaginally 6 days a week. 1.125 g 11   Current Facility-Administered Medications  Medication Dose Route Frequency Provider Last Rate Last Admin   0.9 %  sodium chloride infusion  500 mL Intravenous Once Armbruster, Carlota Raspberry, MD         Allergies:   Neomycin   Social History:  The patient  reports that she quit smoking about 34 years ago. Her smoking use included cigarettes. She has a 17.00 pack-year smoking history. She has never used smokeless tobacco. She reports that she does not currently use alcohol after a past usage of about 5.0 standard drinks of alcohol per week. She reports that she does not use drugs.   Family History:   family history includes Cancer in  her brother; Diabetes in her brother and mother; Familial polyposis in her brother; Heart attack (age of onset: 98) in her mother; Heart attack (age of onset: 2) in her father; Heart attack (age of onset: 96) in her brother; Lung cancer in her brother.    Review of Systems: Review of Systems  Constitutional: Negative.   HENT: Negative.    Respiratory: Negative.    Cardiovascular: Negative.   Gastrointestinal: Negative.   Musculoskeletal: Negative.   Neurological: Negative.   Psychiatric/Behavioral: Negative.    All other systems reviewed and are negative.   PHYSICAL EXAM: VS:  BP (!) 140/84 (BP Location: Right Arm, Patient Position: Sitting, Cuff Size:  Normal)   Pulse 89   Ht _0  (1.6 m)   Wt 138 lb 2 oz (62.7 kg)   SpO2 98%   BMI 24.47 kg/m  , BMI Body mass index is 24.47 kg/m. GEN: Well nourished, well developed, in no acute distress HEENT: normal Neck: no JVD, carotid bruits, or masses Cardiac: RRR; no murmurs, rubs, or gallops,no edema  Respiratory:  clear to auscultation bilaterally, normal work of breathing GI: soft, nontender, nondistended, + BS MS: no deformity or atrophy Skin: warm and dry, no rash Neuro:  Strength and sensation are intact Psych: euthymic mood, full affect   Recent Labs: 05/19/2021: Hemoglobin 13.4; Platelets 363.0; TSH 2.02 10/22/2021: ALT 8; BUN 16; Creatinine, Ser 0.59; Potassium 4.2; Sodium 138    Lipid Panel Lab Results  Component Value Date   CHOL 200 10/22/2021   HDL 81.70 10/22/2021   LDLCALC 102 (H) 10/22/2021   TRIG 81.0 10/22/2021      Wt Readings from Last 3 Encounters:  04/10/22 138 lb 2 oz (62.7 kg)  02/02/22 138 lb 12.8 oz (63 kg)  10/24/21 135 lb 9.6 oz (61.5 kg)     ASSESSMENT AND PLAN:  Problem List Items Addressed This Visit       Cardiology Problems   Hyperlipidemia LDL goal <130   Relevant Orders   EKG 12-Lead     Other   Family history of early CAD   Relevant Orders   EKG 12-Lead   Other Visit Diagnoses     Benign essential HTN    -  Primary   Relevant Orders   EKG 12-Lead      Family history of premature coronary disease Discussed screening studies available for risk stratification We have ordered CT coronary calcium scoring For elevated calcium level, consider more aggressive lipid management  Hyperlipidemia Pravastatin 20 mg daily Additional options for more aggressive management if needed based on calcium scoring above would be to add Zetia, and possibly change to more aggressive statin such as Crestor  Essential hypertension Elevated numbers on today's visit, recommended monitoring blood pressure at home.  No changes  made on today's  visit    Total encounter time more than 50 minutes  Greater than 50% was spent in counseling and coordination of care with the patient    Signed, Esmond Plants, M.D., Ph.D. [name], [place]

## 2022-04-10 ENCOUNTER — Encounter: Payer: Self-pay | Admitting: Cardiovascular Disease

## 2022-04-10 ENCOUNTER — Ambulatory Visit: Payer: Medicare PPO | Attending: Cardiovascular Disease | Admitting: Cardiovascular Disease

## 2022-04-10 VITALS — BP 140/84 | HR 89 | Ht 63.0 in | Wt 138.1 lb

## 2022-04-10 DIAGNOSIS — E785 Hyperlipidemia, unspecified: Secondary | ICD-10-CM

## 2022-04-10 DIAGNOSIS — I10 Essential (primary) hypertension: Secondary | ICD-10-CM

## 2022-04-10 DIAGNOSIS — Z8249 Family history of ischemic heart disease and other diseases of the circulatory system: Secondary | ICD-10-CM | POA: Diagnosis not present

## 2022-04-10 NOTE — Patient Instructions (Addendum)
CT coronary calcium score for family hx   Medication Instructions:  No changes  If you need a refill on your cardiac medications before your next appointment, please call your pharmacy.   Lab work: No new labs needed  Testing/Procedures:  Dr. Rockey Situ has ordered a CT coronary calcium score.   Test locations:  [facility]  [address] [place], [place]  Please call [phone] to schedule  This is $99 out of pocket.   Coronary CalciumScan A coronary calcium scan is an imaging test used to look for deposits of calcium and other fatty materials (plaques) in the inner lining of the blood vessels of the heart (coronary arteries). These deposits of calcium and plaques can partly clog and narrow the coronary arteries without producing any symptoms or warning signs. This puts a person at risk for a heart attack. This test can detect these deposits before symptoms develop. Tell a health care provider about: Any allergies you have. All medicines you are taking, including vitamins, herbs, eye drops, creams, and over-the-counter medicines. Any problems you or family members have had with anesthetic medicines. Any blood disorders you have. Any surgeries you have had. Any medical conditions you have. Whether you are pregnant or may be pregnant. What are the risks? Generally, this is a safe procedure. However, problems may occur, including: Harm to a pregnant woman and her unborn baby. This test involves the use of radiation. Radiation exposure can be dangerous to a pregnant woman and her unborn baby. If you are pregnant, you generally should not have this procedure done. Slight increase in the risk of cancer. This is because of the radiation involved in the test. What happens before the procedure? No preparation is needed for this procedure. What happens during the procedure? You will undress and remove any jewelry around your neck or  chest. You will put on a hospital gown. Sticky electrodes will be placed on your chest. The electrodes will be connected to an electrocardiogram (ECG) machine to record a tracing of the electrical activity of your heart. A CT scanner will take pictures of your heart. During this time, you will be asked to lie still and hold your breath for 2-3 seconds while a picture of your heart is being taken. The procedure may vary among health care providers and hospitals. What happens after the procedure? You can get dressed. You can return to your normal activities. It is up to you to get the results of your test. Ask your health care provider, or the department that is doing the test, when your results will be ready. Summary A coronary calcium scan is an imaging test used to look for deposits of calcium and other fatty materials (plaques) in the inner lining of the blood vessels of the heart (coronary arteries). Generally, this is a safe procedure. Tell your health care provider if you are pregnant or may be pregnant. No preparation is needed for this procedure. A CT scanner will take pictures of your heart. You can return to your normal activities after the scan is done. This information is not intended to replace advice given to you by your health care provider. Make sure you discuss any questions you have with your health care provider. Document Released: 01/16/2008 Document Revised: 06/08/2016 Document Reviewed: 06/08/2016 Elsevier Interactive Patient Education  2017 [place]: At [facility], you and your health needs are our priority.  As part of our continuing  mission to provide you with exceptional heart care, we have created designated Provider Care Teams.  These Care Teams include your primary Cardiologist (physician) and Advanced Practice Providers (APPs -  Physician Assistants and Nurse Practitioners) who all work together to provide you with the care you need, when you  need it.  You will need a follow up appointment as needed  Providers on your designated Care Team:   Murray Hodgkins, NP Christell Faith, PA-C Cadence Kathlen Mody, [place]  COVID-19 Vaccine Information can be found at: ShippingScam.co.uk For questions related to vaccine distribution or appointments, please email [email or web address] or call [phone].

## 2022-04-17 ENCOUNTER — Ambulatory Visit
Admission: RE | Admit: 2022-04-17 | Discharge: 2022-04-17 | Disposition: A | Discharge status: Home / Self Care | Payer: Medicare PPO | Source: Ambulatory Visit | Attending: Cardiovascular Disease | Admitting: Cardiovascular Disease

## 2022-04-17 DIAGNOSIS — E785 Hyperlipidemia, unspecified: Secondary | ICD-10-CM

## 2022-04-17 DIAGNOSIS — Z8249 Family history of ischemic heart disease and other diseases of the circulatory system: Secondary | ICD-10-CM | POA: Insufficient documentation

## 2022-04-20 ENCOUNTER — Encounter: Payer: Self-pay | Admitting: Cardiovascular Disease

## 2022-04-20 ENCOUNTER — Encounter: Payer: Self-pay | Admitting: Internal Medicine

## 2022-04-23 ENCOUNTER — Encounter: Payer: Self-pay | Admitting: Internal Medicine

## 2022-04-29 ENCOUNTER — Other Ambulatory Visit (INDEPENDENT_AMBULATORY_CARE_PROVIDER_SITE_OTHER): Payer: Medicare PPO

## 2022-04-29 DIAGNOSIS — R030 Elevated blood-pressure reading, without diagnosis of hypertension: Secondary | ICD-10-CM | POA: Diagnosis not present

## 2022-04-29 DIAGNOSIS — Z1329 Encounter for screening for other suspected endocrine disorder: Secondary | ICD-10-CM | POA: Diagnosis not present

## 2022-04-29 DIAGNOSIS — E785 Hyperlipidemia, unspecified: Secondary | ICD-10-CM | POA: Diagnosis not present

## 2022-04-29 DIAGNOSIS — R7301 Impaired fasting glucose: Secondary | ICD-10-CM | POA: Diagnosis not present

## 2022-04-29 LAB — COMPREHENSIVE METABOLIC PANEL
ALT: 7 U/L (ref 0–35)
AST: 18 U/L (ref 0–37)
Albumin: 4.7 g/dL (ref 3.5–5.2)
Alkaline Phosphatase: 62 U/L (ref 39–117)
BUN: 17 mg/dL (ref 6–23)
CO2: 30 mEq/L (ref 19–32)
Calcium: 9.8 mg/dL (ref 8.4–10.5)
Chloride: 103 mEq/L (ref 96–112)
Creatinine, Ser: 0.63 mg/dL (ref 0.40–1.20)
GFR: 93.15 mL/min (ref >60.00)
Glucose, Bld: 107 mg/dL — ABNORMAL HIGH (ref 70–99)
Potassium: 4.5 mEq/L (ref 3.5–5.1)
Sodium: 139 mEq/L (ref 135–145)
Total Bilirubin: 0.5 mg/dL (ref 0.2–1.2)
Total Protein: 7.3 g/dL (ref 6.0–8.3)

## 2022-04-29 LAB — CBC WITH DIFFERENTIAL/PLATELET
Basophils Absolute: 0 10*3/uL (ref 0.0–0.1)
Basophils Relative: 0.6 % (ref 0.0–3.0)
Eosinophils Absolute: 0.1 10*3/uL (ref 0.0–0.7)
Eosinophils Relative: 2 % (ref 0.0–5.0)
HCT: 39.4 % (ref 36.0–46.0)
Hemoglobin: 13.6 g/dL (ref 12.0–15.0)
Lymphocytes Relative: 39.8 % (ref 12.0–46.0)
Lymphs Abs: 2 10*3/uL (ref 0.7–4.0)
MCHC: 34.5 g/dL (ref 30.0–36.0)
MCV: 89.3 fl (ref 78.0–100.0)
Monocytes Absolute: 0.5 10*3/uL (ref 0.1–1.0)
Monocytes Relative: 9.5 % (ref 3.0–12.0)
Neutro Abs: 2.4 10*3/uL (ref 1.4–7.7)
Neutrophils Relative %: 48.1 % (ref 43.0–77.0)
Platelets: 300 10*3/uL (ref 150.0–400.0)
RBC: 4.41 Mil/uL (ref 3.87–5.11)
RDW: 13.3 % (ref 11.5–15.5)
WBC: 4.9 10*3/uL (ref 4.0–10.5)

## 2022-04-29 LAB — LIPID PANEL
Cholesterol: 218 mg/dL — ABNORMAL HIGH (ref 0–200)
HDL: 74.1 mg/dL (ref >39.00)
LDL Cholesterol: 126 mg/dL — ABNORMAL HIGH (ref 0–99)
NonHDL: 144.19
Total CHOL/HDL Ratio: 3
Triglycerides: 90 mg/dL (ref 0.0–149.0)
VLDL: 18 mg/dL (ref 0.0–40.0)

## 2022-04-29 LAB — HEMOGLOBIN A1C: Hgb A1c MFr Bld: 6 % (ref 4.6–6.5)

## 2022-04-29 LAB — TSH: TSH: 1.68 u[IU]/mL (ref 0.35–5.50)

## 2022-04-30 ENCOUNTER — Other Ambulatory Visit: Payer: Self-pay | Admitting: Internal Medicine

## 2022-04-30 ENCOUNTER — Ambulatory Visit: Payer: Medicare PPO | Admitting: Internal Medicine

## 2022-04-30 MED ORDER — ROSUVASTATIN CALCIUM 20 MG PO TABS: 20.0000 mg | ORAL_TABLET | Freq: Every day | ORAL | 0 refills | Status: DC

## 2022-05-12 ENCOUNTER — Other Ambulatory Visit (HOSPITAL_COMMUNITY)
Admission: RE | Admit: 2022-05-12 | Discharge: 2022-05-12 | Disposition: A | Discharge status: Home / Self Care | Payer: Medicare PPO | Source: Ambulatory Visit | Attending: Internal Medicine | Admitting: Internal Medicine

## 2022-05-12 ENCOUNTER — Encounter: Payer: Self-pay | Admitting: Internal Medicine

## 2022-05-12 ENCOUNTER — Ambulatory Visit (INDEPENDENT_AMBULATORY_CARE_PROVIDER_SITE_OTHER): Payer: Medicare PPO | Admitting: Internal Medicine

## 2022-05-12 VITALS — BP 136/70 | HR 116 | Temp 97.5°F | Ht 63.0 in | Wt 136.6 lb

## 2022-05-12 DIAGNOSIS — Z90710 Acquired absence of both cervix and uterus: Secondary | ICD-10-CM

## 2022-05-12 DIAGNOSIS — E785 Hyperlipidemia, unspecified: Secondary | ICD-10-CM | POA: Diagnosis not present

## 2022-05-12 DIAGNOSIS — Z23 Encounter for immunization: Secondary | ICD-10-CM | POA: Diagnosis not present

## 2022-05-12 DIAGNOSIS — Z1231 Encounter for screening mammogram for malignant neoplasm of breast: Secondary | ICD-10-CM

## 2022-05-12 DIAGNOSIS — Z01419 Encounter for gynecological examination (general) (routine) without abnormal findings: Secondary | ICD-10-CM | POA: Insufficient documentation

## 2022-05-12 DIAGNOSIS — Z1151 Encounter for screening for human papillomavirus (HPV): Secondary | ICD-10-CM | POA: Diagnosis not present

## 2022-05-12 DIAGNOSIS — Z124 Encounter for screening for malignant neoplasm of cervix: Secondary | ICD-10-CM | POA: Diagnosis not present

## 2022-05-12 NOTE — Patient Instructions (Signed)
That was your last pap smear ever!  Do not start Crestor until after you return from your trips ,  stay on pravastatin for the trips  Return for liver tests  after 3 weeks of Crestor.   Lipids can be  checked any time after 6 weeks

## 2022-05-12 NOTE — Assessment & Plan Note (Signed)
Absence of cervix confirmed during pelvic exam today.  PAP smear done ; vaginal discharge noted,  Sent for cytology

## 2022-05-12 NOTE — Assessment & Plan Note (Signed)
She will continue pravastatin until she returns from her 14 day vacation in November; then she will change to crestor and rtc for lfts 3 weeks after crestor start

## 2022-05-12 NOTE — Progress Notes (Signed)
Subjective:  Patient ID: Michele Hess, female    DOB: [birth year]  Age: 66 y.o. MRN: [id number]  CC: The primary encounter diagnosis was Screening for cervical cancer. Diagnoses of Encounter for screening mammogram for malignant neoplasm of breast, S/P total hysterectomy, Hyperlipidemia LDL goal <130, and Need for immunization against influenza were also pertinent to this visit.   HPI Michele Hess presents for PAP smear .  Seen In 2023/02/16 for welcome to medicare visit     Chief Complaint  Patient presents with   Follow-up    Pap smear and breast exam   Father died in 16-Feb-2023 of metastatic CANCER,   the weekend after she moved him into assisted living in [place].  She has been travelling back and forth all summer   Recent fall this weekedn.  She [place], had been drinkging a little wine while visiting with friends in the mountains this weekend. Bumped head.   Forehead stll sore but no dizziness,  blurred vision, or headache    Outpatient Medications Prior to Visit  Medication Sig Dispense Refill   amLODipine (NORVASC) 2.5 MG tablet Take 1 tablet (2.5 mg total) by mouth daily. 90 tablet 3   calcium carbonate (CALCIUM 600) 600 MG TABS tablet Take 1 tablet (600 mg total) by mouth daily with breakfast. 90 tablet 1   Lactobacillus (PROBIOTIC ACIDOPHILUS PO) Take 1 capsule by mouth daily.     loratadine (CLARITIN) 10 MG tablet Take 10 mg by mouth daily as needed for allergies.     Multiple Vitamins-Minerals (EMERGEN-C VITAMIN C) PACK Take 1 packet by mouth daily as needed.     PROGESTERONE, VAGINAL, 4 % GEL Place 0. 5  ml  intravaginally 6 days a week. 1.125 g 11   rosuvastatin (CRESTOR) 20 MG tablet Take 1 tablet (20 mg total) by mouth daily. (Patient not taking: Reported on 05/12/2022) 90 tablet 0   Facility-Administered Medications Prior to Visit  Medication Dose Route Frequency Provider Last Rate Last Admin   0.9 %  sodium chloride infusion  500  mL Intravenous Once Armbruster, Carlota Raspberry, MD        Review of Systems;  Patient denies headache, fevers, malaise, unintentional weight loss, skin rash, eye pain, sinus congestion and sinus pain, sore throat, dysphagia,  hemoptysis , cough, dyspnea, wheezing, chest pain, palpitations, orthopnea, edema, abdominal pain, nausea, melena, diarrhea, constipation, flank pain, dysuria, hematuria, urinary  Frequency, nocturia, numbness, tingling, seizures,  Focal weakness, Loss of consciousness,  Tremor, insomnia, depression, anxiety, and suicidal ideation.      Objective:  BP 136/70 (BP Location: Left Arm, Patient Position: Sitting, Cuff Size: Normal)   Pulse (!) 116   Temp (!) 97.5 F (36.4 C) (Oral)   Ht '5\' 3"'$  (1.6 m)   Wt 136 lb 9.6 oz (62 kg)   SpO2 98%   BMI 24.20 kg/m   BP Readings from Last 3 Encounters:  05/12/22 136/70  04/10/22 (!) 140/84  02/02/22 112/74    Wt Readings from Last 3 Encounters:  05/12/22 136 lb 9.6 oz (62 kg)  04/10/22 138 lb 2 oz (62.7 kg)  02/02/22 138 lb 12.8 oz (63 kg)   General Appearance:    Alert, cooperative, no distress, appears stated age  Head:    Normocephalic, without obvious abnormality, atraumatic  Eyes:    PERRL, conjunctiva/corneas clear, EOM's intact, fundi    benign, both eyes  Ears:    Normal TM's and  external ear canals, both ears  Nose:   Nares normal, septum midline, mucosa normal, no drainage    or sinus tenderness  Throat:   Lips, mucosa, and tongue normal; teeth and gums normal  Neck:   Supple, symmetrical, trachea midline, no adenopathy;    thyroid:  no enlargement/tenderness/nodules; no carotid   bruit or JVD  Back:     Symmetric, no curvature, ROM normal, no CVA tenderness  Lungs:     Clear to auscultation bilaterally, respirations unlabored  Chest Wall:    No tenderness or deformity   Heart:    Regular rate and rhythm, S1 and S2 normal, no murmur, rub   or gallop  Breast Exam:    No tenderness, masses, or nipple abnormality   Abdomen:     Soft, non-tender, bowel sounds active all four quadrants,    no masses, no organomegaly  Genitalia:    Pelvic: cervix  surgically absent , external genitalia normal, no adnexal masses or tenderness, , uterus absent and vagina normal with moderate amount of yellow/brown discharge  Extremities:   Extremities normal, atraumatic, no cyanosis or edema  Pulses:   2+ and symmetric all extremities  Skin:   Skin color, texture, turgor normal, no rashes or lesions  Lymph nodes:   Cervical, supraclavicular, and axillary nodes normal  Neurologic:   CNII-XII intact, normal strength, sensation and reflexes    throughout    Lab Results  Component Value Date   HGBA1C 6.0 04/29/2022   HGBA1C 5.9 10/22/2021   HGBA1C 5.7 05/19/2021    Lab Results  Component Value Date   CREATININE 0.63 04/29/2022   CREATININE 0.59 10/22/2021   CREATININE 0.66 05/19/2021    Lab Results  Component Value Date   WBC 4.9 04/29/2022   HGB 13.6 04/29/2022   HCT 39.4 04/29/2022   PLT 300.0 04/29/2022   GLUCOSE 107 (H) 04/29/2022   CHOL 218 (H) 04/29/2022   TRIG 90.0 04/29/2022   HDL 74.10 04/29/2022   LDLDIRECT 137.0 06/05/2015   LDLCALC 126 (H) 04/29/2022   ALT 7 04/29/2022   AST 18 04/29/2022   NA 139 04/29/2022   K 4.5 04/29/2022   CL 103 04/29/2022   CREATININE 0.63 04/29/2022   BUN 17 04/29/2022   CO2 30 04/29/2022   TSH 1.68 04/29/2022   HGBA1C 6.0 04/29/2022   MICROALBUR 0.9 10/22/2021    CT CARDIAC SCORING (SELF PAY ONLY)  Addendum Date: 04/17/2022   ADDENDUM REPORT: 04/17/2022 16:20 EXAM: OVER-READ INTERPRETATION  CT CHEST The following report is an over-read performed by radiologist Dr. Yvonne Kendall of [facility] on 04/17/2022. This over-read does not include interpretation of cardiac or coronary anatomy or pathology. The coronary calcium score interpretation by the cardiologist is attached. COMPARISON:  Chest two views 05/31/2005 FINDINGS: Cardiovascular: There are no  significant extracardiac vascular findings. Mediastinum/Nodes: There are no enlarged lymph nodes within the visualized mediastinum. Lungs/Pleura: There is no pleural effusion. Mild central bilateral bronchiectasis. Mild curvilinear subsegmental atelectasis versus scarring within the medial aspect of the middle lobe, medial aspect of the right and left lower lobes, and the lingula. Mild posterior left greater than right lower lobe subpleural densities, compatible with subsegmental atelectasis. Upper abdomen: No significant findings in the visualized upper abdomen. Musculoskeletal/Chest wall: Mild-to-moderate multilevel degenerative disc changes of the adult are thoracic spine. IMPRESSION: No significant extracardiac findings within the visualized chest. Electronically Signed   By: Yvonne Kendall M.D.   On: 04/17/2022 16:20   Result Date: 04/17/2022  CLINICAL DATA:  Risk stratification EXAM: Coronary Calcium Score TECHNIQUE: The patient was scanned on a Siemens Somatom go.Top Scanner. Axial non-contrast 3 mm slices were carried out through the heart. The data set was analyzed on a dedicated work station and scored using the [place]. FINDINGS: Non-cardiac: See separate report from [facility]. Ascending Aorta: Normal size Pericardium: Normal Coronary arteries: Normal origin of left and right coronary arteries. Distribution of arterial calcifications if present, as noted below; LM 0 LAD 129 LCx 0 RCA 0.97 Total 130 IMPRESSION AND RECOMMENDATION: 1. Coronary calcium score of 130. This was 84th percentile for age and sex matched control. 2. CAC 100-299 in LAD, RCA. CAC-DRS A2/N2. 3. Recommend aspirin and statin if no contraindication. 4. Continue heart healthy lifestyle and risk factor modification. Electronically Signed: By: Kate Sable M.D. On: 04/17/2022 15:36    Assessment & Plan:   Problem List Items Addressed This Visit     Hyperlipidemia LDL goal <130    She will continue pravastatin  until she returns from her 17 day vacation in November; then she will change to crestor and rtc for lfts 3 weeks after crestor start       Relevant Orders   Comprehensive metabolic panel   S/P total hysterectomy    Absence of cervix confirmed during pelvic exam today.  PAP smear done ; vaginal discharge noted,  Sent for cytology      Other Visit Diagnoses     Screening for cervical cancer    -  Primary   Relevant Orders   Cytology - PAP( [facility])   Encounter for screening mammogram for malignant neoplasm of breast       Relevant Orders   MM 3D SCREEN BREAST BILATERAL   Need for immunization against influenza       Relevant Orders   Flu Vaccine QUAD High Dose(Fluad) (Completed)      Follow-up: Return in about 6 months (around 11/11/2022).   Crecencio Mc, MD

## 2022-05-14 LAB — CYTOLOGY - PAP
Chlamydia: NEGATIVE
Comment: NEGATIVE
Comment: NEGATIVE
Comment: NEGATIVE
Comment: NORMAL
Diagnosis: NEGATIVE
High risk HPV: NEGATIVE
Neisseria Gonorrhea: NEGATIVE
Trichomonas: NEGATIVE

## 2022-05-14 MED ORDER — PRAVASTATIN SODIUM 20 MG PO TABS: ORAL_TABLET | ORAL | 0 refills | Status: DC

## 2022-05-25 DIAGNOSIS — H524 Presbyopia: Secondary | ICD-10-CM | POA: Diagnosis not present

## 2022-06-09 ENCOUNTER — Other Ambulatory Visit: Payer: Self-pay | Admitting: Internal Medicine

## 2022-06-09 NOTE — Telephone Encounter (Signed)
LMTCB to appears that patient should be on rosuvastatin.

## 2022-06-27 ENCOUNTER — Encounter: Payer: Self-pay | Admitting: Internal Medicine

## 2022-06-27 ENCOUNTER — Encounter: Payer: Self-pay | Admitting: Cardiovascular Disease

## 2022-07-15 ENCOUNTER — Other Ambulatory Visit (INDEPENDENT_AMBULATORY_CARE_PROVIDER_SITE_OTHER): Payer: Medicare PPO

## 2022-07-15 DIAGNOSIS — E785 Hyperlipidemia, unspecified: Secondary | ICD-10-CM

## 2022-07-15 LAB — COMPREHENSIVE METABOLIC PANEL
ALT: 11 U/L (ref 0–35)
AST: 25 U/L (ref 0–37)
Albumin: 4.8 g/dL (ref 3.5–5.2)
Alkaline Phosphatase: 57 U/L (ref 39–117)
BUN: 13 mg/dL (ref 6–23)
CO2: 29 mEq/L (ref 19–32)
Calcium: 9.9 mg/dL (ref 8.4–10.5)
Chloride: 101 mEq/L (ref 96–112)
Creatinine, Ser: 0.6 mg/dL (ref 0.40–1.20)
GFR: 94.11 mL/min (ref >60.00)
Glucose, Bld: 103 mg/dL — ABNORMAL HIGH (ref 70–99)
Potassium: 4.4 mEq/L (ref 3.5–5.1)
Sodium: 139 mEq/L (ref 135–145)
Total Bilirubin: 0.5 mg/dL (ref 0.2–1.2)
Total Protein: 7.3 g/dL (ref 6.0–8.3)

## 2022-07-29 ENCOUNTER — Ambulatory Visit
Admission: RE | Admit: 2022-07-29 | Discharge: 2022-07-29 | Disposition: A | Discharge status: Home / Self Care | Payer: Medicare PPO | Source: Ambulatory Visit | Attending: Internal Medicine | Admitting: Internal Medicine

## 2022-07-29 DIAGNOSIS — Z1231 Encounter for screening mammogram for malignant neoplasm of breast: Secondary | ICD-10-CM | POA: Insufficient documentation

## 2022-07-31 ENCOUNTER — Other Ambulatory Visit: Payer: Self-pay | Admitting: Internal Medicine

## 2022-08-14 ENCOUNTER — Ambulatory Visit: Payer: Medicare PPO | Admitting: Family Medicine

## 2022-08-14 ENCOUNTER — Encounter: Payer: Self-pay | Admitting: Family Medicine

## 2022-08-14 VITALS — BP 130/80 | HR 96 | Temp 97.7°F | Ht 63.0 in | Wt 142.2 lb

## 2022-08-14 DIAGNOSIS — J0100 Acute maxillary sinusitis, unspecified: Secondary | ICD-10-CM

## 2022-08-14 DIAGNOSIS — J329 Chronic sinusitis, unspecified: Secondary | ICD-10-CM | POA: Insufficient documentation

## 2022-08-14 MED ORDER — AMOXICILLIN-POT CLAVULANATE 875-125 MG PO TABS: 1.0000 | ORAL_TABLET | Freq: Two times a day (BID) | ORAL | 0 refills | Status: DC

## 2022-08-14 NOTE — Assessment & Plan Note (Signed)
Symptoms are most likely related to sinusitis.  Will treat with Augmentin 1 tablet twice daily for 7 days.  Discussed it may take 3 to 4 days for symptoms to start to improve.  If they are not improving by then she will let us know.  If they worsen at any point on antibiotics or after she finishes antibiotics she will let us know.  Discussed risk of diarrhea with antibiotics.  She will continue her probiotic.  She will let us know if she has excessively watery stool.

## 2022-08-14 NOTE — Progress Notes (Signed)
Michele Rumps, MD Phone: [phone]  NYSA SARIN is a 67 y.o. female who presents today for same day.   Sinusitis: Patient reports onset of symptoms a little over a week ago.  She has had maxillary and frontal sinus pressure and congestion with some ear fullness.  She has no fevers.  She has had postnasal drip.  No sore throat.  No known COVID exposure or flu exposure.  She has not tested for COVID.  She is blowing some green mucus out of her nose intermittently.  She notes the congestion has progressively worsened.  Social History   Tobacco Use  Smoking Status Former   Packs/day: 1.00   Years: 17.00   Total pack years: 17.00   Types: Cigarettes   Quit date: 05/15/1987   Years since quitting: 35.2  Smokeless Tobacco Never  Tobacco Comments   quit 27 years ago     Current Outpatient Medications on File Prior to Visit  Medication Sig Dispense Refill   amLODipine (NORVASC) 2.5 MG tablet Take 1 tablet (2.5 mg total) by mouth daily. 90 tablet 3   calcium carbonate (CALCIUM 600) 600 MG TABS tablet Take 1 tablet (600 mg total) by mouth daily with breakfast. 90 tablet 1   Lactobacillus (PROBIOTIC ACIDOPHILUS PO) Take 1 capsule by mouth daily.     loratadine (CLARITIN) 10 MG tablet Take 10 mg by mouth daily as needed for allergies.     Multiple Vitamins-Minerals (EMERGEN-C VITAMIN C) PACK Take 1 packet by mouth daily as needed.     rosuvastatin (CRESTOR) 20 MG tablet TAKE 1 TABLET BY MOUTH EVERY DAY 90 tablet 0   pravastatin (PRAVACHOL) 20 MG tablet TAKE 1 TABLET BY MOUTH EVERY DAY IN THE EVENING (Patient not taking: Reported on 08/14/2022) 90 tablet 1   PROGESTERONE, VAGINAL, 4 % GEL Place 0. 5  ml  intravaginally 6 days a week. (Patient not taking: Reported on 08/14/2022) 1.125 g 11   Current Facility-Administered Medications on File Prior to Visit  Medication Dose Route Frequency Provider Last Rate Last Admin   0.9 %  sodium chloride infusion  500 mL Intravenous Once Armbruster,  Carlota Raspberry, MD         ROS see history of present illness  Objective  Physical Exam Vitals:   08/14/22 1513  BP: 130/80  Pulse: 96  Temp: 97.7 F (36.5 C)  SpO2: 98%    BP Readings from Last 3 Encounters:  08/14/22 130/80  05/12/22 136/70  04/10/22 (!) 140/84   Wt Readings from Last 3 Encounters:  08/14/22 142 lb 3.2 oz (64.5 kg)  05/12/22 136 lb 9.6 oz (62 kg)  04/10/22 138 lb 2 oz (62.7 kg)    Physical Exam Constitutional:      General: She is not in acute distress.    Appearance: She is not diaphoretic.  HENT:     Head:     Comments: Slight maxillary sinus tenderness to percussion    Right Ear: Tympanic membrane normal.     Left Ear: Tympanic membrane normal.     Mouth/Throat:     Mouth: Mucous membranes are moist.     Pharynx: Oropharynx is clear.  Cardiovascular:     Rate and Rhythm: Normal rate and regular rhythm.     Heart sounds: Normal heart sounds.  Pulmonary:     Effort: Pulmonary effort is normal.     Breath sounds: Normal breath sounds.  Lymphadenopathy:     Cervical: No cervical adenopathy.  Skin:  General: Skin is warm and dry.  Neurological:     Mental Status: She is alert.      Assessment/Plan: Please see individual problem list.  Acute non-recurrent maxillary sinusitis Assessment & Plan: Symptoms are most likely related to sinusitis.  Will treat with Augmentin 1 tablet twice daily for 7 days.  Discussed it may take 3 to 4 days for symptoms to start to improve.  If they are not improving by then she will let us know.  If they worsen at any point on antibiotics or after she finishes antibiotics she will let us know.  Discussed risk of diarrhea with antibiotics.  She will continue her probiotic.  She will let us know if she has excessively watery stool.  Orders: -     Amoxicillin-Pot Clavulanate; Take 1 tablet by mouth 2 (two) times daily.  Dispense: 14 tablet; Refill: 0    Return if symptoms worsen or fail to improve.   Michele Rumps, MD [place]

## 2022-10-08 ENCOUNTER — Encounter: Payer: Self-pay | Admitting: Nurse Practitioner

## 2022-10-08 ENCOUNTER — Ambulatory Visit: Payer: Medicare PPO | Admitting: Nurse Practitioner

## 2022-10-08 VITALS — BP 124/72 | HR 96 | Temp 98.6°F | Ht 62.5 in | Wt 138.2 lb

## 2022-10-08 DIAGNOSIS — M25512 Pain in left shoulder: Secondary | ICD-10-CM

## 2022-10-08 MED ORDER — DICLOFENAC SODIUM 1 % EX GEL: 4.0000 g | Freq: Three times a day (TID) | CUTANEOUS | 0 refills | Status: AC | PRN

## 2022-10-08 NOTE — Patient Instructions (Signed)
Will send referral to PT  after getting the message from you. Voltaren gel sent to pharmacy. Alternate heat and cold pack.  If symptoms did not improves please call the office for further evaluation.

## 2022-10-08 NOTE — Progress Notes (Signed)
Established Patient Office Visit  Subjective:  Patient ID: Michele Hess, female    DOB: June 15, 1956  Age: 67 y.o. MRN: [id number]  CC:  Chief Complaint  Patient presents with   Arm Pain    Left arm dull ache and tingling in left  hand    HPI  Michele Hess presents for left arm soreness and tingling sensation off and on from a month. But now it is more noticeable.   She denies any weakness of one side, vision issue, headaches, chest pain or shortness of breath. HPI   Past Medical History:  Diagnosis Date   Anxiety    "white coat syndrome"   GERD (gastroesophageal reflux disease)    History of ETT    myoview 7/09: 9 min 31 sec, EF 71%, nov evidence for ischemia. ETT 99/10)" 1030", excellent exercise tolerance, no abnormal rhythms, no evidence for ischemia   HLD (hyperlipidemia)    Menopausal and postmenopausal disorder    Osteopenia    Seasonal allergies    Seizure ([place]) 01/2016   Dr. Jannifer Franklin- per pt- MD told her is was not a seizure   Syncope and collapse 01/27/2016    Past Surgical History:  Procedure Laterality Date   ANTERIOR CRUCIATE LIGAMENT REPAIR Left 2011   COLONOSCOPY  2019   SA-MAC-prep good-TA's-recall 3 yrs   echo (other)  04/2009   EF 60%, lv normal, RV normal, mild MR, trivial pericardial effusion    LUMBAR LAMINECTOMY  10/2020   PARTIAL HYSTERECTOMY     PELVIC LAPAROSCOPY  1987   POLYPECTOMY  2019   TA's   TONSILLECTOMY  1961   TOTAL ABDOMINAL HYSTERECTOMY  1993   Michele Hess   WISDOM TOOTH EXTRACTION      Family History  Problem Relation Age of Onset   Heart attack Mother 68   Diabetes Mother    Heart attack Father 62   Cancer Brother    Lung cancer Brother    Heart attack Brother 31       4 VESSEL BYPASS   Diabetes Brother    Familial polyposis Brother    Colon cancer Neg Hx    Stomach cancer Neg Hx    Esophageal cancer Neg Hx    Rectal cancer Neg Hx    Colon polyps Neg Hx     Social History   Socioeconomic History   Marital  status: Married    Spouse name: Michele Hess   Number of children: 2   Years of education: 12   Highest education level: Not on file  Occupational History   Occupation: Retired  Tobacco Use   Smoking status: Former    Packs/day: 1.00    Years: 17.00    Additional pack years: 0.00    Total pack years: 17.00    Types: Cigarettes    Quit date: 05/15/1987    Years since quitting: 35.4   Smokeless tobacco: Never   Tobacco comments:    quit 27 years ago   Vaping Use   Vaping Use: Never used  Substance and Sexual Activity   Alcohol use: Not Currently    Alcohol/week: 5.0 standard drinks of alcohol    Types: 5 Standard drinks or equivalent per week    Comment: socially   Drug use: No   Sexual activity: Yes    Birth control/protection: Post-menopausal  Other Topics Concern   Not on file  Social History Narrative   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  Lives in [place] w/ her husband   Right-handed   Drinks 1/2 caf coffee 2-3 times per day   Social Determinants of Health   Financial Resource Strain: Not on file  Food Insecurity: Not on file  Transportation Needs: Not on file  Physical Activity: Not on file  Stress: Not on file  Social Connections: Not on file  Intimate Partner Violence: Not on file     Outpatient Medications Prior to Visit  Medication Sig Dispense Refill   amLODipine (NORVASC) 2.5 MG tablet Take 1 tablet (2.5 mg total) by mouth daily. 90 tablet 3   calcium carbonate (CALCIUM 600) 600 MG TABS tablet Take 1 tablet (600 mg total) by mouth daily with breakfast. 90 tablet 1   Lactobacillus (PROBIOTIC ACIDOPHILUS PO) Take 1 capsule by mouth daily.     loratadine (CLARITIN) 10 MG tablet Take 10 mg by mouth daily as needed for allergies.     Multiple Vitamins-Minerals (EMERGEN-C VITAMIN C) PACK Take 1 packet by mouth daily as needed.     rosuvastatin (CRESTOR) 20 MG tablet TAKE 1 TABLET BY MOUTH EVERY DAY 90 tablet 0   amoxicillin-clavulanate (AUGMENTIN)  875-125 MG tablet Take 1 tablet by mouth 2 (two) times daily. (Patient not taking: Reported on 10/08/2022) 14 tablet 0   pravastatin (PRAVACHOL) 20 MG tablet TAKE 1 TABLET BY MOUTH EVERY DAY IN THE EVENING (Patient not taking: Reported on 08/14/2022) 90 tablet 1   PROGESTERONE, VAGINAL, 4 % GEL Place 0. 5  ml  intravaginally 6 days a week. (Patient not taking: Reported on 08/14/2022) 1.125 g 11   Facility-Administered Medications Prior to Visit  Medication Dose Route Frequency Provider Last Rate Last Admin   0.9 %  sodium chloride infusion  500 mL Intravenous Once Armbruster, Carlota Raspberry, MD        Allergies  Allergen Reactions   Neomycin     ROS Review of Systems  Constitutional: Negative.   Respiratory: Negative.    Cardiovascular: Negative.   Musculoskeletal:        Left hand pain, numbness and tingling.  Neurological: Negative.   Psychiatric/Behavioral: Negative.        Objective:    Physical Exam Constitutional:      Appearance: Normal appearance. She is obese.  HENT:     Head: Normocephalic.  Eyes:     Extraocular Movements: Extraocular movements intact.     Conjunctiva/sclera: Conjunctivae normal.     Pupils: Pupils are equal, round, and reactive to light.  Cardiovascular:     Rate and Rhythm: Normal rate and regular rhythm.     Pulses: Normal pulses.     Heart sounds: Normal heart sounds.  Pulmonary:     Effort: Pulmonary effort is normal. No respiratory distress.     Breath sounds: Normal breath sounds. No rhonchi.  Abdominal:     General: Bowel sounds are normal.     Palpations: Abdomen is soft. There is no mass.     Tenderness: There is no abdominal tenderness.     Hernia: No hernia is present.  Musculoskeletal:        General: Tenderness (Left shoulder with movement.) present.     Cervical back: Neck supple. No tenderness.  Skin:    General: Skin is warm.     Capillary Refill: Capillary refill takes less than 2 seconds.  Neurological:     General: No focal  deficit present.     Mental Status: She is alert and oriented to person, place, and  time. Mental status is at baseline.  Psychiatric:        Mood and Affect: Mood normal.        Behavior: Behavior normal.        Thought Content: Thought content normal.        Judgment: Judgment normal.     BP 124/72   Pulse 96   Temp 98.6 F (37 C) (Oral)   Ht 5' 2.5" (1.588 m)   Wt 138 lb 3.2 oz (62.7 kg)   SpO2 96%   BMI 24.87 kg/m  Wt Readings from Last 3 Encounters:  10/08/22 138 lb 3.2 oz (62.7 kg)  08/14/22 142 lb 3.2 oz (64.5 kg)  05/12/22 136 lb 9.6 oz (62 kg)     Health Maintenance  Topic Date Due   COVID-19 Vaccine (7 - 2023-24 season) 07/13/2022   Medicare Annual Wellness (AWV)  02/03/2023   MAMMOGRAM  07/30/2023   PAP SMEAR-Modifier  05/12/2025   COLONOSCOPY (Pts 45-64yrs Insurance coverage will need to be confirmed)  03/24/2026   DTaP/Tdap/Td (3 - Td or Tdap) 10/06/2028   Pneumonia Vaccine 25+ Years old  Completed   INFLUENZA VACCINE  Completed   DEXA SCAN  Completed   Hepatitis C Screening  Completed   HIV Screening  Completed   Zoster Vaccines- Shingrix  Completed   HPV VACCINES  Aged Out    There are no preventive care reminders to display for this patient.  Lab Results  Component Value Date   TSH 1.68 04/29/2022   Lab Results  Component Value Date   WBC 4.9 04/29/2022   HGB 13.6 04/29/2022   HCT 39.4 04/29/2022   MCV 89.3 04/29/2022   PLT 300.0 04/29/2022   Lab Results  Component Value Date   NA 139 07/15/2022   K 4.4 07/15/2022   CO2 29 07/15/2022   GLUCOSE 103 (H) 07/15/2022   BUN 13 07/15/2022   CREATININE 0.60 07/15/2022   BILITOT 0.5 07/15/2022   ALKPHOS 57 07/15/2022   AST 25 07/15/2022   ALT 11 07/15/2022   PROT 7.3 07/15/2022   ALBUMIN 4.8 07/15/2022   CALCIUM 9.9 07/15/2022   GFR 94.11 07/15/2022   Lab Results  Component Value Date   CHOL 218 (H) 04/29/2022   Lab Results  Component Value Date   HDL 74.10 04/29/2022   Lab  Results  Component Value Date   LDLCALC 126 (H) 04/29/2022   Lab Results  Component Value Date   TRIG 90.0 04/29/2022   Lab Results  Component Value Date   CHOLHDL 3 04/29/2022   Lab Results  Component Value Date   HGBA1C 6.0 04/29/2022      Assessment & Plan:  Acute pain of left shoulder Assessment & Plan: Rx Voltaren gel sent to the pharmacy. Encouraged to alternate hot and cold pack. Will send referral to physical therapy after patient decide where she wants to go for therapy.   Other orders -     Diclofenac Sodium; Apply 4 g topically 3 (three) times daily as needed.  Dispense: 50 g; Refill: 0    Follow-up: Return if symptoms worsen or fail to improve.   Theresia Lo, NP

## 2022-10-20 ENCOUNTER — Encounter: Payer: Self-pay | Admitting: Internal Medicine

## 2022-10-20 ENCOUNTER — Encounter: Payer: Self-pay | Admitting: Nurse Practitioner

## 2022-10-20 DIAGNOSIS — R7301 Impaired fasting glucose: Secondary | ICD-10-CM

## 2022-10-20 DIAGNOSIS — E785 Hyperlipidemia, unspecified: Secondary | ICD-10-CM

## 2022-10-20 DIAGNOSIS — M25512 Pain in left shoulder: Secondary | ICD-10-CM | POA: Insufficient documentation

## 2022-10-20 DIAGNOSIS — R5383 Other fatigue: Secondary | ICD-10-CM

## 2022-10-20 DIAGNOSIS — I10 Essential (primary) hypertension: Secondary | ICD-10-CM

## 2022-10-20 NOTE — Assessment & Plan Note (Signed)
Rx Voltaren gel sent to the pharmacy. Encouraged to alternate hot and cold pack. Will send referral to physical therapy after patient decide where she wants to go for therapy.

## 2022-10-26 ENCOUNTER — Other Ambulatory Visit: Payer: Self-pay | Admitting: Internal Medicine

## 2022-10-30 DIAGNOSIS — M542 Cervicalgia: Secondary | ICD-10-CM | POA: Diagnosis not present

## 2022-10-30 DIAGNOSIS — M5412 Radiculopathy, cervical region: Secondary | ICD-10-CM | POA: Diagnosis not present

## 2022-11-09 ENCOUNTER — Other Ambulatory Visit (INDEPENDENT_AMBULATORY_CARE_PROVIDER_SITE_OTHER): Payer: Medicare PPO

## 2022-11-09 DIAGNOSIS — E785 Hyperlipidemia, unspecified: Secondary | ICD-10-CM

## 2022-11-09 DIAGNOSIS — I10 Essential (primary) hypertension: Secondary | ICD-10-CM | POA: Diagnosis not present

## 2022-11-09 DIAGNOSIS — R7301 Impaired fasting glucose: Secondary | ICD-10-CM | POA: Diagnosis not present

## 2022-11-09 DIAGNOSIS — R5383 Other fatigue: Secondary | ICD-10-CM | POA: Diagnosis not present

## 2022-11-09 LAB — LIPID PANEL
Cholesterol: 179 mg/dL (ref 0–200)
HDL: 74.5 mg/dL (ref >39.00)
LDL Cholesterol: 88 mg/dL (ref 0–99)
NonHDL: 104.31
Total CHOL/HDL Ratio: 2
Triglycerides: 84 mg/dL (ref 0.0–149.0)
VLDL: 16.8 mg/dL (ref 0.0–40.0)

## 2022-11-09 LAB — LDL CHOLESTEROL, DIRECT: Direct LDL: 78 mg/dL

## 2022-11-09 LAB — COMPREHENSIVE METABOLIC PANEL
ALT: 6 U/L (ref 0–35)
AST: 18 U/L (ref 0–37)
Albumin: 4.7 g/dL (ref 3.5–5.2)
Alkaline Phosphatase: 60 U/L (ref 39–117)
BUN: 13 mg/dL (ref 6–23)
CO2: 26 mEq/L (ref 19–32)
Calcium: 9.7 mg/dL (ref 8.4–10.5)
Chloride: 102 mEq/L (ref 96–112)
Creatinine, Ser: 0.63 mg/dL (ref 0.40–1.20)
GFR: 92.8 mL/min (ref >60.00)
Glucose, Bld: 89 mg/dL (ref 70–99)
Potassium: 4.8 mEq/L (ref 3.5–5.1)
Sodium: 139 mEq/L (ref 135–145)
Total Bilirubin: 0.4 mg/dL (ref 0.2–1.2)
Total Protein: 7.1 g/dL (ref 6.0–8.3)

## 2022-11-09 LAB — CBC WITH DIFFERENTIAL/PLATELET
Basophils Absolute: 0 10*3/uL (ref 0.0–0.1)
Basophils Relative: 0.6 % (ref 0.0–3.0)
Eosinophils Absolute: 0.3 10*3/uL (ref 0.0–0.7)
Eosinophils Relative: 5 % (ref 0.0–5.0)
HCT: 40 % (ref 36.0–46.0)
Hemoglobin: 13.6 g/dL (ref 12.0–15.0)
Lymphocytes Relative: 35.5 % (ref 12.0–46.0)
Lymphs Abs: 2.3 10*3/uL (ref 0.7–4.0)
MCHC: 34.1 g/dL (ref 30.0–36.0)
MCV: 88.1 fl (ref 78.0–100.0)
Monocytes Absolute: 0.5 10*3/uL (ref 0.1–1.0)
Monocytes Relative: 7.9 % (ref 3.0–12.0)
Neutro Abs: 3.2 10*3/uL (ref 1.4–7.7)
Neutrophils Relative %: 51 % (ref 43.0–77.0)
Platelets: 320 10*3/uL (ref 150.0–400.0)
RBC: 4.54 Mil/uL (ref 3.87–5.11)
RDW: 13.5 % (ref 11.5–15.5)
WBC: 6.4 10*3/uL (ref 4.0–10.5)

## 2022-11-09 LAB — HEMOGLOBIN A1C: Hgb A1c MFr Bld: 5.9 % (ref 4.6–6.5)

## 2022-11-09 LAB — TSH: TSH: 2.18 u[IU]/mL (ref 0.35–5.50)

## 2022-11-11 ENCOUNTER — Encounter: Payer: Self-pay | Admitting: Cardiovascular Disease

## 2022-11-11 ENCOUNTER — Ambulatory Visit: Payer: Medicare PPO | Admitting: Internal Medicine

## 2022-11-11 ENCOUNTER — Encounter: Payer: Self-pay | Admitting: Internal Medicine

## 2022-11-11 VITALS — BP 132/78 | HR 88 | Temp 98.0°F | Resp 16 | Ht 63.0 in | Wt 139.4 lb

## 2022-11-11 DIAGNOSIS — S52501S Unspecified fracture of the lower end of right radius, sequela: Secondary | ICD-10-CM

## 2022-11-11 DIAGNOSIS — E785 Hyperlipidemia, unspecified: Secondary | ICD-10-CM

## 2022-11-11 DIAGNOSIS — Z8249 Family history of ischemic heart disease and other diseases of the circulatory system: Secondary | ICD-10-CM

## 2022-11-11 DIAGNOSIS — I10 Essential (primary) hypertension: Secondary | ICD-10-CM | POA: Diagnosis not present

## 2022-11-11 LAB — MICROALBUMIN / CREATININE URINE RATIO
Creatinine,U: 24.8 mg/dL
Microalb Creat Ratio: 2.8 mg/g (ref 0.0–30.0)
Microalb, Ur: <0.7 mg/dL (ref 0.0–1.9)

## 2022-11-11 MED ORDER — AMLODIPINE BESYLATE 2.5 MG PO TABS: 2.5000 mg | ORAL_TABLET | Freq: Every day | ORAL | 3 refills | Status: DC

## 2022-11-11 MED ORDER — ROSUVASTATIN CALCIUM 20 MG PO TABS: 20.0000 mg | ORAL_TABLET | Freq: Every day | ORAL | 3 refills | Status: DC

## 2022-11-11 NOTE — Assessment & Plan Note (Signed)
LDL is > 70 on  current rosuvastatin dose;  she has been given the option of increasing the dose or adding zetia.

## 2022-11-11 NOTE — Patient Instructions (Signed)
Labs look good,  but LDL is not at goal < 70 (per Dr. Windell Hummingbird recs from Sept 2023)  We an either increase dose of Crestor to 40 mg or add Zetia 10 g daily to current dose  2) BP looks good.  Continue amlodipine for now.  If you have proteinuria (urine in protein) we will change medication to losartan.

## 2022-11-11 NOTE — Progress Notes (Unsigned)
Subjective:  Patient ID: Michele Hess, female    DOB: May 18, 1956  Age: 67 y.o. MRN: [id number]  CC: The primary encounter diagnosis was Family history of early CAD. Diagnoses of White coat syndrome with hypertension, Hyperlipidemia LDL goal <70, and Closed fracture of distal end of right radius, unspecified fracture morphology, sequela were also pertinent to this visit.   HPI Michele Hess presents for  Chief Complaint  Patient presents with   Medical Management of Chronic Issues    She has returned from a Recent 3 week trip to [place] and [place].  No illnesses .  Has resumed exercising 3 days at gym weekly,  pickleball  2 days/week .  Her diabetic overweight Brother passed suddenly  in November  at 77 after having a 4 vessel  CABG 5 months earlier  recovery had been  complicated by ARDS.     1) HTN:  Hypertension: patient checks blood pressure twice weekly at home.  Readings have been for the most part <130/80 at rest . Patient is following a reduced salt diet most days and is taking amlodipine as prescribed   2) HLD:  she has elevated coronary calcium score of 130 and a FH of CAD>.  She is tolerating 20 mg crestor without myalgias.  Currentl LDL 88 ,  (goal is  70 per cardiology)  3) prediabetes :  likely secondary to statin.  Following a high protein low carb diet and exercising.    Outpatient Medications Prior to Visit  Medication Sig Dispense Refill   calcium carbonate (CALCIUM 600) 600 MG TABS tablet Take 1 tablet (600 mg total) by mouth daily with breakfast. 90 tablet 1   diclofenac Sodium (VOLTAREN) 1 % GEL Apply 4 g topically 3 (three) times daily as needed. 50 g 0   Lactobacillus (PROBIOTIC ACIDOPHILUS PO) Take 1 capsule by mouth daily.     loratadine (CLARITIN) 10 MG tablet Take 10 mg by mouth daily as needed for allergies.     Multiple Vitamins-Minerals (EMERGEN-C VITAMIN C) PACK Take 1 packet by mouth daily as needed.     amLODipine (NORVASC) 2.5 MG tablet Take 1  tablet (2.5 mg total) by mouth daily. 90 tablet 3   rosuvastatin (CRESTOR) 20 MG tablet TAKE 1 TABLET BY MOUTH EVERY DAY 90 tablet 0   Facility-Administered Medications Prior to Visit  Medication Dose Route Frequency Provider Last Rate Last Admin   0.9 %  sodium chloride infusion  500 mL Intravenous Once Armbruster, Willaim Rayas, MD        Review of Systems;  Patient denies headache, fevers, malaise, unintentional weight loss, skin rash, eye pain, sinus congestion and sinus pain, sore throat, dysphagia,  hemoptysis , cough, dyspnea, wheezing, chest pain, palpitations, orthopnea, edema, abdominal pain, nausea, melena, diarrhea, constipation, flank pain, dysuria, hematuria, urinary  Frequency, nocturia, numbness, tingling, seizures,  Focal weakness, Loss of consciousness,  Tremor, insomnia, depression, anxiety, and suicidal ideation.      Objective:  BP 132/78   Pulse 88   Temp 98 F (36.7 C)   Resp 16   Ht 5\' 3"  (1.6 m)   Wt 139 lb 6.4 oz (63.2 kg)   SpO2 98%   BMI 24.69 kg/m   BP Readings from Last 3 Encounters:  11/11/22 132/78  10/08/22 124/72  08/14/22 130/80    Wt Readings from Last 3 Encounters:  11/11/22 139 lb 6.4 oz (63.2 kg)  10/08/22 138 lb 3.2 oz (62.7 kg)  08/14/22 142 lb  3.2 oz (64.5 kg)    Physical Exam Vitals reviewed.  Constitutional:      General: She is not in acute distress.    Appearance: Normal appearance. She is normal weight. She is not ill-appearing, toxic-appearing or diaphoretic.  HENT:     Head: Normocephalic.  Eyes:     General: No scleral icterus.       Right eye: No discharge.        Left eye: No discharge.     Conjunctiva/sclera: Conjunctivae normal.  Cardiovascular:     Rate and Rhythm: Normal rate and regular rhythm.     Heart sounds: Normal heart sounds.  Pulmonary:     Effort: Pulmonary effort is normal. No respiratory distress.     Breath sounds: Normal breath sounds.  Musculoskeletal:        General: Normal range of motion.   Skin:    General: Skin is warm and dry.  Neurological:     General: No focal deficit present.     Mental Status: She is alert and oriented to person, place, and time. Mental status is at baseline.  Psychiatric:        Mood and Affect: Mood normal.        Behavior: Behavior normal.        Thought Content: Thought content normal.        Judgment: Judgment normal.    Lab Results  Component Value Date   HGBA1C 5.9 11/09/2022   HGBA1C 6.0 04/29/2022   HGBA1C 5.9 10/22/2021    Lab Results  Component Value Date   CREATININE 0.63 11/09/2022   CREATININE 0.60 07/15/2022   CREATININE 0.63 04/29/2022    Lab Results  Component Value Date   WBC 6.4 11/09/2022   HGB 13.6 11/09/2022   HCT 40.0 11/09/2022   PLT 320.0 11/09/2022   GLUCOSE 89 11/09/2022   CHOL 179 11/09/2022   TRIG 84.0 11/09/2022   HDL 74.50 11/09/2022   LDLDIRECT 78.0 11/09/2022   LDLCALC 88 11/09/2022   ALT 6 11/09/2022   AST 18 11/09/2022   NA 139 11/09/2022   K 4.8 11/09/2022   CL 102 11/09/2022   CREATININE 0.63 11/09/2022   BUN 13 11/09/2022   CO2 26 11/09/2022   TSH 2.18 11/09/2022   HGBA1C 5.9 11/09/2022   MICROALBUR <0.7 11/11/2022    MM 3D SCREEN BREAST BILATERAL  Result Date: 07/30/2022 CLINICAL DATA:  Screening. EXAM: DIGITAL SCREENING BILATERAL MAMMOGRAM WITH TOMOSYNTHESIS AND CAD TECHNIQUE: Bilateral screening digital craniocaudal and mediolateral oblique mammograms were obtained. Bilateral screening digital breast tomosynthesis was performed. The images were evaluated with computer-aided detection. COMPARISON:  Previous exam(s). ACR Breast Density Category b: There are scattered areas of fibroglandular density. FINDINGS: There are no findings suspicious for malignancy. IMPRESSION: No mammographic evidence of malignancy. A result letter of this screening mammogram will be mailed directly to the patient. RECOMMENDATION: Screening mammogram in one year. (Code:SM-B-01Y) BI-RADS CATEGORY  1: Negative.  Electronically Signed   By: Amie Portlandavid  Ormond M.D.   On: 07/30/2022 10:22    Assessment & Plan:  .Family history of early CAD  White coat syndrome with hypertension Assessment & Plan: HOME READINGS HAVE BEEN 130/80 or less   ON AMLODIPINE 2.5 MG DAILY USING HER   NEW OMRON MACHINE  WHICH HAS BEEN CALIBRATED TODAY AND FOUND TO BE ACCURATE.     Continue low dose amlodipine   Orders: -     Microalbumin / creatinine urine ratio  Hyperlipidemia LDL goal <70  Assessment & Plan: LDL is > 70 on  current rosuvastatin dose;  she has been given the option of increasing the dose or adding zetia.     Closed fracture of distal end of right radius, unspecified fracture morphology, sequela  Other orders -     amLODIPine Besylate; Take 1 tablet (2.5 mg total) by mouth daily.  Dispense: 90 tablet; Refill: 3 -     Rosuvastatin Calcium; Take 1 tablet (20 mg total) by mouth daily.  Dispense: 90 tablet; Refill: 3     I provided 31 minutes of face-to-face time during this encounter reviewing patient's last visit with me, patient's  most recent visit with cardiology,  recent surgical and non surgical procedures, previous  labs and imaging studies, counseling on currently addressed issues,  and post visit ordering  of diagnostics and therapeutics .   Follow-up: No follow-ups on file.   Sherlene Shams, MD

## 2022-11-12 ENCOUNTER — Encounter: Payer: Self-pay | Admitting: Internal Medicine

## 2022-11-12 NOTE — Assessment & Plan Note (Signed)
HOME READINGS HAVE BEEN 130/80 or less   ON AMLODIPINE 2.5 MG DAILY USING HER   NEW OMRON MACHINE  WHICH HAS BEEN CALIBRATED TODAY AND FOUND TO BE ACCURATE.     Continue low dose amlodipine

## 2022-11-13 ENCOUNTER — Encounter: Payer: Self-pay | Admitting: Internal Medicine

## 2022-11-16 ENCOUNTER — Other Ambulatory Visit: Payer: Self-pay | Admitting: Internal Medicine

## 2022-11-16 DIAGNOSIS — E785 Hyperlipidemia, unspecified: Secondary | ICD-10-CM

## 2022-11-16 MED ORDER — EZETIMIBE 10 MG PO TABS
10.0000 mg | ORAL_TABLET | Freq: Every day | ORAL | 3 refills | Status: DC
Start: 2022-11-16 — End: 2023-11-05

## 2022-11-16 NOTE — Assessment & Plan Note (Signed)
Adding Zetia to current regimen of Crestor

## 2022-11-18 DIAGNOSIS — M5416 Radiculopathy, lumbar region: Secondary | ICD-10-CM | POA: Diagnosis not present

## 2022-11-18 DIAGNOSIS — M545 Low back pain, unspecified: Secondary | ICD-10-CM | POA: Diagnosis not present

## 2022-11-26 DIAGNOSIS — M5412 Radiculopathy, cervical region: Secondary | ICD-10-CM | POA: Diagnosis not present

## 2022-11-26 DIAGNOSIS — M542 Cervicalgia: Secondary | ICD-10-CM | POA: Diagnosis not present

## 2022-11-30 ENCOUNTER — Encounter: Payer: Self-pay | Admitting: Internal Medicine

## 2022-12-09 DIAGNOSIS — M5412 Radiculopathy, cervical region: Secondary | ICD-10-CM | POA: Diagnosis not present

## 2022-12-09 DIAGNOSIS — M542 Cervicalgia: Secondary | ICD-10-CM | POA: Diagnosis not present

## 2023-01-08 DIAGNOSIS — M5412 Radiculopathy, cervical region: Secondary | ICD-10-CM | POA: Diagnosis not present

## 2023-01-08 DIAGNOSIS — M542 Cervicalgia: Secondary | ICD-10-CM | POA: Diagnosis not present

## 2023-01-14 ENCOUNTER — Telehealth: Payer: Self-pay | Admitting: Internal Medicine

## 2023-01-14 NOTE — Telephone Encounter (Signed)
Copied from CRM [id number]. Topic: Medicare AWV >> Jan 14, 2023  2:13 PM Payton Doughty wrote: Reason for CRM: LM 01/14/2023 to schedule AWV   Verlee Rossetti; Care Guide Ambulatory Clinical Support [facility] Direct Dial: [phone]

## 2023-02-03 DIAGNOSIS — M542 Cervicalgia: Secondary | ICD-10-CM | POA: Diagnosis not present

## 2023-02-03 DIAGNOSIS — M5412 Radiculopathy, cervical region: Secondary | ICD-10-CM | POA: Diagnosis not present

## 2023-02-10 DIAGNOSIS — D2261 Melanocytic nevi of right upper limb, including shoulder: Secondary | ICD-10-CM | POA: Diagnosis not present

## 2023-02-10 DIAGNOSIS — D2272 Melanocytic nevi of left lower limb, including hip: Secondary | ICD-10-CM | POA: Diagnosis not present

## 2023-02-10 DIAGNOSIS — D2262 Melanocytic nevi of left upper limb, including shoulder: Secondary | ICD-10-CM | POA: Diagnosis not present

## 2023-02-10 DIAGNOSIS — L814 Other melanin hyperpigmentation: Secondary | ICD-10-CM | POA: Diagnosis not present

## 2023-02-10 DIAGNOSIS — D2271 Melanocytic nevi of right lower limb, including hip: Secondary | ICD-10-CM | POA: Diagnosis not present

## 2023-02-10 DIAGNOSIS — L570 Actinic keratosis: Secondary | ICD-10-CM | POA: Diagnosis not present

## 2023-02-10 DIAGNOSIS — D225 Melanocytic nevi of trunk: Secondary | ICD-10-CM | POA: Diagnosis not present

## 2023-02-10 DIAGNOSIS — L821 Other seborrheic keratosis: Secondary | ICD-10-CM | POA: Diagnosis not present

## 2023-02-10 DIAGNOSIS — D485 Neoplasm of uncertain behavior of skin: Secondary | ICD-10-CM | POA: Diagnosis not present

## 2023-02-17 ENCOUNTER — Ambulatory Visit (INDEPENDENT_AMBULATORY_CARE_PROVIDER_SITE_OTHER): Payer: Medicare PPO | Admitting: Internal Medicine

## 2023-02-17 ENCOUNTER — Encounter: Payer: Self-pay | Admitting: Internal Medicine

## 2023-02-17 VITALS — BP 103/73 | HR 76 | Temp 97.9°F | Ht 63.0 in | Wt 142.4 lb

## 2023-02-17 DIAGNOSIS — Z7184 Encounter for health counseling related to travel: Secondary | ICD-10-CM

## 2023-02-17 DIAGNOSIS — R200 Anesthesia of skin: Secondary | ICD-10-CM | POA: Diagnosis not present

## 2023-02-17 DIAGNOSIS — R202 Paresthesia of skin: Secondary | ICD-10-CM

## 2023-02-17 DIAGNOSIS — Z1231 Encounter for screening mammogram for malignant neoplasm of breast: Secondary | ICD-10-CM

## 2023-02-17 DIAGNOSIS — E785 Hyperlipidemia, unspecified: Secondary | ICD-10-CM

## 2023-02-17 DIAGNOSIS — Z0000 Encounter for general adult medical examination without abnormal findings: Secondary | ICD-10-CM | POA: Diagnosis not present

## 2023-02-17 DIAGNOSIS — I10 Essential (primary) hypertension: Secondary | ICD-10-CM

## 2023-02-17 MED ORDER — PROMETHAZINE HCL 12.5 MG PO TABS: 12.5000 mg | ORAL_TABLET | Freq: Four times a day (QID) | ORAL | 0 refills | Status: DC | PRN

## 2023-02-17 MED ORDER — LEVOFLOXACIN 500 MG PO TABS: 500.0000 mg | ORAL_TABLET | Freq: Every day | ORAL | 0 refills | Status: AC

## 2023-02-17 MED ORDER — PREDNISONE 10 MG PO TABS: ORAL_TABLET | ORAL | 0 refills | Status: DC

## 2023-02-17 NOTE — Patient Instructions (Addendum)
YOUR MAMMOGRAM IS DUE in December, PLEASE CALL AND GET THIS SCHEDULED! [facility] - call [phone]   Please add one 81 mg aspirin  twice weekly to your drug regimen and continue zetia and crestor     For your upcoming trip:   Flu vaccine prior to travel is  recommended  COVID vaccine optional  (CDC recommends 2 doses of the 2023 newer formulations ,  at least 4 months apart)  The COVID Antivirals are pretty much worthless and overpriced so don't bother .  Take the prednisone taper , levaquin and phenergan with  you.  Use the prednisone taper and phenergan with delsym or robitussin for cough , if you get COVID.   use  tylenol/motrin for aches and fevers.    Add levaquin if you develop sinus infection (facial pain,  gross discharge,  colored phlegm) or UTI symptoms    Benefiber with prebiotics  can be substituted for probiotics as long as you have not had antibiotics . Daily use of a probiotic advised for 3 weeks minimum whenever you take an antibiotic

## 2023-02-17 NOTE — Assessment & Plan Note (Signed)
Improving but not resolved with PT>  strength normal,  no thenar atrophy. Continue PT>  if symptoms progress, refer for EMG/[place] studies

## 2023-02-17 NOTE — Assessment & Plan Note (Signed)
HOME READINGS HAVE BEEN 130/80 or less   ON AMLODIPINE 2.5 MG DAILY USING HER   NEW OMRON MACHINE  WHICH HAS BEEN CALIBRATED  ON PRIOR VISITS AND FOUND TO BE ACCURATE.     Continue low dose amlodipine

## 2023-02-17 NOTE — Assessment & Plan Note (Signed)

## 2023-02-17 NOTE — Progress Notes (Signed)
Patient ID: Michele Hess, female    DOB: March 04, 1956  Age: 67 y.o. MRN: [id number]  The patient is here for annual preventive examination and management of other chronic and acute problems.   The risk factors are reflected in the social history.   The roster of all physicians providing medical care to patient - is listed in the Snapshot section of the chart.   Activities of daily living:  The patient is 100% independent in all ADLs: dressing, toileting, feeding as well as independent mobility   Home safety : The patient has smoke detectors in the home. They wear seatbelts.  There are no unsecured firearms at home. There is no violence in the home.    There is no risks for hepatitis, STDs or HIV. There is no   history of blood transfusion. They have no travel history to infectious disease endemic areas of the world.   The patient has seen their dentist in the last six month. They have seen their eye doctor in the last year. The patinet  denies slight hearing difficulty with regard to whispered voices and some television programs.  They have deferred audiologic testing in the last year.  They do not  have excessive sun exposure. Discussed the need for sun protection: hats, long sleeves and use of sunscreen if there is significant sun exposure.    Diet: the importance of a healthy diet is discussed. They do have a healthy diet.   The benefits of regular aerobic exercise were discussed. The patient  exercises  3 to 5 days per week  for  60 minutes.    Depression screen: there are no signs or vegative symptoms of depression- irritability, change in appetite, anhedonia, sadness/tearfullness.   The following portions of the patient's history were reviewed and updated as appropriate: allergies, current medications, past family history, past medical history,  past surgical history, past social history  and problem list.   Visual acuity was not assessed per patient preference since the patient has  regular follow up with an  ophthalmologist. Hearing and body mass index were assessed and reviewed.    During the course of the visit the patient was educated and counseled about appropriate screening and preventive services including : fall prevention , diabetes screening, nutrition counseling, colorectal cancer screening, and recommended immunizations.    Chief Complaint:  HPI     Medical Management of Chronic Issues    Additional comments: Yearly follow up       Last edited by Sandy Salaam, CMA on 02/17/2023  9:28 AM.      1) HLD:  taking Crestor 2 and Zetia for elevated coronary score of 130 and early FH of CAD.  Not taking asa   2) HTN:  3) atypical nevus removed from back and AK  on left zygoma frozen off by Dasher   4) numbness intermittent l  runs from elbow to left hand . Occasionally starts in shoulder . Started PT in April  not done    Review of Symptoms  Patient denies headache, fevers, malaise, unintentional weight loss, skin rash, eye pain, sinus congestion and sinus pain, sore throat, dysphagia,  hemoptysis , cough, dyspnea, wheezing, chest pain, palpitations, orthopnea, edema, abdominal pain, nausea, melena, diarrhea, constipation, flank pain, dysuria, hematuria, urinary  Frequency, nocturia, numbness, tingling, seizures,  Focal weakness, Loss of consciousness,  Tremor, insomnia, depression, anxiety, and suicidal ideation.    Physical Exam:  BP 103/73   Pulse 76   Temp 97.9 F (  36.6 C) (Oral)   Ht 5\' 3"  (1.6 m)   Wt 142 lb 6.4 oz (64.6 kg)   SpO2 98%   BMI 25.23 kg/m    Physical Exam Vitals reviewed.  Constitutional:      General: She is not in acute distress.    Appearance: Normal appearance. She is well-developed and normal weight. She is not ill-appearing, toxic-appearing or diaphoretic.  HENT:     Head: Normocephalic.     Right Ear: Tympanic membrane, ear canal and external ear normal. There is no impacted cerumen.     Left Ear: Tympanic  membrane, ear canal and external ear normal. There is no impacted cerumen.     Nose: Nose normal.     Mouth/Throat:     Mouth: Mucous membranes are moist.     Pharynx: Oropharynx is clear.  Eyes:     General: No scleral icterus.       Right eye: No discharge.        Left eye: No discharge.     Conjunctiva/sclera: Conjunctivae normal.     Pupils: Pupils are equal, round, and reactive to light.  Neck:     Thyroid: No thyromegaly.     Vascular: No carotid bruit or JVD.  Cardiovascular:     Rate and Rhythm: Normal rate and regular rhythm.     Heart sounds: Normal heart sounds.  Pulmonary:     Effort: Pulmonary effort is normal. No respiratory distress.     Breath sounds: Normal breath sounds.  Chest:  Breasts:    Breasts are symmetrical.     Right: Normal. No swelling, inverted nipple, mass, nipple discharge, skin change or tenderness.     Left: Normal. No swelling, inverted nipple, mass, nipple discharge, skin change or tenderness.  Abdominal:     General: Bowel sounds are normal.     Palpations: Abdomen is soft. There is no mass.     Tenderness: There is no abdominal tenderness. There is no guarding or rebound.  Musculoskeletal:        General: Normal range of motion.     Cervical back: Normal range of motion and neck supple.  Lymphadenopathy:     Cervical: No cervical adenopathy.     Upper Body:     Right upper body: No supraclavicular, axillary or pectoral adenopathy.     Left upper body: No supraclavicular, axillary or pectoral adenopathy.  Skin:    General: Skin is warm and dry.  Neurological:     General: No focal deficit present.     Mental Status: She is alert and oriented to person, place, and time. Mental status is at baseline.  Psychiatric:        Mood and Affect: Mood normal.        Behavior: Behavior normal.        Thought Content: Thought content normal.        Judgment: Judgment normal.    Assessment and Plan: Encounter for preventive health  examination Assessment & Plan: age appropriate education and counseling updated, referrals for preventative services and immunizations addressed, dietary and smoking counseling addressed, most recent labs reviewed.  I have personally reviewed and have noted:   1) the patient's medical and social history 2) The pt's use of alcohol, tobacco, and illicit drugs 3) The patient's current medications and supplements 4) Functional ability including ADL's, fall risk, home safety risk, hearing and visual impairment 5) Diet and physical activities 6) Evidence for depression or mood disorder 7) The  patient's height, weight, and BMI have been recorded in the chart   I have made referrals, and provided counseling and education based on review of the above    Breast cancer screening by mammogram -     3D Screening Mammogram, Left and Right; Future  White coat syndrome with hypertension Assessment & Plan: HOME READINGS HAVE BEEN 130/80 or less   ON AMLODIPINE 2.5 MG DAILY USING HER   NEW OMRON MACHINE  WHICH HAS BEEN CALIBRATED  ON PRIOR VISITS AND FOUND TO BE ACCURATE.     Continue low dose amlodipine    Hyperlipidemia LDL goal <70 Assessment & Plan: Reviewed Cardiac CT from 2023 Continue Crestor and Zetia .  Add baby asa 2/week .    Lab Results  Component Value Date   CHOL 179 11/09/2022   HDL 74.50 11/09/2022   LDLCALC 88 11/09/2022   LDLDIRECT 78.0 11/09/2022   TRIG 84.0 11/09/2022   CHOLHDL 2 11/09/2022     Orders: -     Comprehensive metabolic panel; Future -     Lipid Panel w/reflex Direct LDL; Future  Numbness and tingling in left hand Assessment & Plan: Improving but not resolved with PT>  strength normal,  no thenar atrophy. Continue PT>  if symptoms progress, refer for EMG/[place] studies   Travel advice encounter Assessment & Plan:  I have sent Prednisone taper,  Levaquin and promethazine (generic for phenergan for nausea) to her pharmacy to take on  her 30 day trip / cruise.  Advised to not Start the antibiotic  For a viral URI,  precautions and instructions for use given. COVID and flu vacccinations t]prior to travel recommended    Other orders -     predniSONE; 6 tablets on Day 1 , then reduce by 1 tablet daily until gone  Dispense: 21 tablet; Refill: 0 -     levoFLOXacin; Take 1 tablet (500 mg total) by mouth daily for 7 days.  Dispense: 7 tablet; Refill: 0 -     Promethazine HCl; Take 1 tablet (12.5 mg total) by mouth every 6 (six) hours as needed for nausea or vomiting.  Dispense: 30 tablet; Refill: 0    No follow-ups on file.  Sherlene Shams, MD

## 2023-02-17 NOTE — Assessment & Plan Note (Signed)
Reviewed Cardiac CT from 2023 Continue Crestor and Zetia .  Add baby asa 2/week .    Lab Results  Component Value Date   CHOL 179 11/09/2022   HDL 74.50 11/09/2022   LDLCALC 88 11/09/2022   LDLDIRECT 78.0 11/09/2022   TRIG 84.0 11/09/2022   CHOLHDL 2 11/09/2022

## 2023-02-17 NOTE — Assessment & Plan Note (Signed)
  I have sent Prednisone taper,  Levaquin and promethazine (generic for phenergan for nausea) to her pharmacy to take on  her 30 day trip / cruise. Advised to not Start the antibiotic  For a viral URI,  precautions and instructions for use given. COVID and flu vacccinations t]prior to travel recommended

## 2023-02-19 DIAGNOSIS — M542 Cervicalgia: Secondary | ICD-10-CM | POA: Diagnosis not present

## 2023-02-19 DIAGNOSIS — M5412 Radiculopathy, cervical region: Secondary | ICD-10-CM | POA: Diagnosis not present

## 2023-03-02 ENCOUNTER — Ambulatory Visit: Payer: Medicare PPO

## 2023-03-03 ENCOUNTER — Ambulatory Visit (INDEPENDENT_AMBULATORY_CARE_PROVIDER_SITE_OTHER): Payer: Medicare PPO | Admitting: *Deleted

## 2023-03-03 VITALS — Ht 63.0 in | Wt 142.0 lb

## 2023-03-03 DIAGNOSIS — Z0000 Encounter for general adult medical examination without abnormal findings: Secondary | ICD-10-CM

## 2023-03-03 NOTE — Progress Notes (Signed)
Subjective:   Michele Hess is a 67 y.o. female who presents for an Initial Medicare Annual Wellness Visit.  Visit Complete: Virtual  I connected with  Michele Hess on 03/03/23 by a audio enabled telemedicine application and verified that I am speaking with the correct person using two identifiers.  Patient Location: Home  Provider Location: Office/Clinic  I discussed the limitations of evaluation and management by telemedicine. The patient expressed understanding and agreed to proceed.  Vital Signs: Unable to obtain new vitals due to this being a telehealth visit.   Review of Systems     Cardiac Risk Factors include: advanced age (>8men, >42 women);dyslipidemia     Objective:    Today's Vitals   03/03/23 0820  Weight: 142 lb (64.4 kg)  Height: 5\' 3"  (1.6 m)   Body mass index is 25.15 kg/m.     03/03/2023    8:34 AM  Advanced Directives  Does Patient Have a Medical Advance Directive? Yes  Type of Estate agent of [place];Living will  Copy of Healthcare Power of Attorney in Chart? No - copy requested    Current Medications (verified) Outpatient Encounter Medications as of 03/03/2023  Medication Sig   amLODipine (NORVASC) 2.5 MG tablet Take 1 tablet (2.5 mg total) by mouth daily.   calcium carbonate (CALCIUM 600) 600 MG TABS tablet Take 1 tablet (600 mg total) by mouth daily with breakfast.   diclofenac Sodium (VOLTAREN) 1 % GEL Apply 4 g topically 3 (three) times daily as needed.   ezetimibe (ZETIA) 10 MG tablet Take 1 tablet (10 mg total) by mouth daily.   Lactobacillus (PROBIOTIC ACIDOPHILUS PO) Take 1 capsule by mouth daily.   loratadine (CLARITIN) 10 MG tablet Take 10 mg by mouth daily as needed for allergies.   Multiple Vitamins-Minerals (EMERGEN-C VITAMIN C) PACK Take 1 packet by mouth daily as needed.   promethazine (PHENERGAN) 12.5 MG tablet Take 1 tablet (12.5 mg total) by mouth every 6 (six) hours as needed for nausea or vomiting.    rosuvastatin (CRESTOR) 20 MG tablet Take 1 tablet (20 mg total) by mouth daily.   [DISCONTINUED] predniSONE (DELTASONE) 10 MG tablet 6 tablets on Day 1 , then reduce by 1 tablet daily until gone (Patient not taking: Reported on 03/03/2023)   Facility-Administered Encounter Medications as of 03/03/2023  Medication   0.9 %  sodium chloride infusion    Allergies (verified) Neomycin   History: Past Medical History:  Diagnosis Date   Anxiety    "white coat syndrome"   GERD (gastroesophageal reflux disease)    History of ETT    myoview 7/09: 9 min 31 sec, EF 71%, nov evidence for ischemia. ETT 99/10)" 1030", excellent exercise tolerance, no abnormal rhythms, no evidence for ischemia   HLD (hyperlipidemia)    Menopausal and postmenopausal disorder    Osteopenia    Seasonal allergies    Seizure (HCC) 01/2016   Dr. Anne Hahn- per pt- MD told her is was not a seizure   Syncope and collapse 01/27/2016   Past Surgical History:  Procedure Laterality Date   ANTERIOR CRUCIATE LIGAMENT REPAIR Left 2011   COLONOSCOPY  2019   SA-MAC-prep good-TA's-recall 3 yrs   echo (other)  04/2009   EF 60%, lv normal, RV normal, mild MR, trivial pericardial effusion    LUMBAR LAMINECTOMY  10/2020   PARTIAL HYSTERECTOMY     PELVIC LAPAROSCOPY  1987   POLYPECTOMY  2019   TA's   TONSILLECTOMY  1961   TOTAL ABDOMINAL HYSTERECTOMY  1993   [facility]   WISDOM TOOTH EXTRACTION     Family History  Problem Relation Age of Onset   Heart attack Mother 83   Diabetes Mother    Heart attack Father 66   Cancer Brother 25       metastatic lung CA   Lung cancer Brother    Heart failure Brother    Heart attack Brother 84       4 VESSEL BYPASS   Diabetes Brother    Familial polyposis Brother    Colon cancer Neg Hx    Stomach cancer Neg Hx    Esophageal cancer Neg Hx    Rectal cancer Neg Hx    Colon polyps Neg Hx    Social History   Socioeconomic History   Marital status: Married    Spouse name: Michele Hess    Number of children: 2   Years of education: 12   Highest education level: Not on file  Occupational History   Occupation: Retired  Tobacco Use   Smoking status: Former    Current packs/day: 0.00    Average packs/day: 1 pack/day for 17.0 years (17.0 ttl pk-yrs)    Types: Cigarettes    Start date: 05/14/1970    Quit date: 05/15/1987    Years since quitting: 35.8   Smokeless tobacco: Never   Tobacco comments:    quit 27 years ago   Vaping Use   Vaping status: Never Used  Substance and Sexual Activity   Alcohol use: Not Currently    Alcohol/week: 5.0 standard drinks of alcohol    Types: 5 Standard drinks or equivalent per week    Comment: socially   Drug use: No   Sexual activity: Yes    Birth control/protection: Post-menopausal  Other Topics Concern   Not on file  Social History Narrative   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    Lives in [place] w/ her husband   Right-handed   Drinks 1/2 caf coffee 2-3 times per day   Social Determinants of Health   Financial Resource Strain: Low Risk  (03/03/2023)   Overall Financial Resource Strain (CARDIA)    Difficulty of Paying Living Expenses: Not hard at all  Food Insecurity: No Food Insecurity (03/03/2023)   Hunger Vital Sign    Worried About Running Out of Food in the Last Year: Never true    Ran Out of Food in the Last Year: Never true  Transportation Needs: No Transportation Needs (03/03/2023)   PRAPARE - Administrator, Civil Service (Medical): No    Lack of Transportation (Non-Medical): No  Physical Activity: Sufficiently Active (03/03/2023)   Exercise Vital Sign    Days of Exercise per Week: 3 days    Minutes of Exercise per Session: 50 min  Stress: No Stress Concern Present (03/03/2023)   [employer] of Occupational Health - Occupational Stress Questionnaire    Feeling of Stress : Not at all  Social Connections: Moderately Integrated (03/03/2023)   Social Connection and Isolation Panel [NHANES]     Frequency of Communication with Friends and Family: More than three times a week    Frequency of Social Gatherings with Friends and Family: More than three times a week    Attends Religious Services: Never    Database administrator or Organizations: Yes    Attends Engineer, structural: More than 4 times per year    Marital Status: Married  Tobacco Counseling Counseling given: Not Answered Tobacco comments: quit 27 years ago    Clinical Intake:  Pre-visit preparation completed: Yes  Pain : No/denies pain     BMI - recorded: 25.15 Nutritional Status: BMI 25 -29 Overweight Nutritional Risks: None Diabetes: No  How often do you need to have someone help you when you read instructions, pamphlets, or other written materials from your doctor or pharmacy?: 1 - Never  Interpreter Needed?: No  Information entered by :: R. Ariahna Smiddy LPN   Activities of Daily Living    03/03/2023    8:22 AM  In your present state of health, do you have any difficulty performing the following activities:  Hearing? 0  Vision? 0  Comment g;lasses  Difficulty concentrating or making decisions? 0  Walking or climbing stairs? 0  Dressing or bathing? 0  Doing errands, shopping? 0  Preparing Food and eating ? N  Using the Toilet? N  In the past six months, have you accidently leaked urine? N  Do you have problems with loss of bowel control? N  Managing your Medications? N  Managing your Finances? N  Housekeeping or managing your Housekeeping? N    Patient Care Team: Sherlene Shams, MD as PCP - General (Internal Medicine)  Indicate any recent Medical Services you may have received from other than [facility] providers in the past year (date may be approximate).     Assessment:   This is a routine wellness examination for [place].  Hearing/Vision screen Hearing Screening - Comments:: No issues Vision Screening - Comments:: glasses  Dietary issues and exercise activities discussed:      Goals Addressed             This Visit's Progress    Patient Stated       Wants to lose weight and do it the healthy way. Continue to exercise.       Depression Screen    03/03/2023    8:29 AM 02/17/2023    9:38 AM 10/08/2022    4:09 PM 05/12/2022   10:18 AM 02/02/2022    8:58 AM 10/24/2021   10:35 AM 05/21/2021   10:12 AM  PHQ 2/9 Scores  PHQ - 2 Score 0 0 0 0 0 0 0  PHQ- 9 Score 0  0        Fall Risk    03/03/2023    8:56 AM 03/03/2023    8:53 AM 03/03/2023    8:24 AM 02/17/2023    9:38 AM 10/08/2022    4:08 PM  Fall Risk   Falls in the past year? 1 1 0 0 1  Number falls in past yr: 0 0 0 0 0  Injury with Fall? 0 0 0 0 1  Risk for fall due to : History of fall(s);Impaired balance/gait History of fall(s);Impaired balance/gait No Fall Risks No Fall Risks History of fall(s)  Follow up Falls evaluation completed;Education provided;Falls prevention discussed Falls evaluation completed;Falls prevention discussed;Education provided Falls prevention discussed;Falls evaluation completed Falls evaluation completed Falls evaluation completed    MEDICARE RISK AT HOME:  Medicare Risk at Home - 03/03/23 0824     Any stairs in or around the home? No    If so, are there any without handrails? No    Home free of loose throw rugs in walkways, pet beds, electrical cords, etc? Yes    Adequate lighting in your home to reduce risk of falls? Yes    Life alert? No  Use of a cane, walker or w/c? No    Grab bars in the bathroom? No    Shower chair or bench in shower? No    Elevated toilet seat or a handicapped toilet? No             Cognitive Function:    02/02/2022    9:09 AM  MMSE - Mini Mental State Exam  Orientation to time 5  Orientation to Place 5  Registration 3  Attention/ Calculation 5  Recall 3  Language- name 2 objects 2  Language- repeat 1  Language- follow 3 step command 3  Language- read & follow direction 1  Write a sentence 1  Copy design 1  Total score 30         03/03/2023    8:34 AM 02/02/2022    8:59 AM  6CIT Screen  What Year? 0 points 0 points  What month? 0 points 0 points  What time? 0 points 0 points  Count back from 20 0 points 0 points  Months in reverse 0 points 0 points  Repeat phrase 0 points 0 points  Total Score 0 points 0 points    Immunizations Immunization History  Administered Date(s) Administered   Covid-19,MRNA Vaccine(Spikevax)68mos. thru 35yrs 05/18/2022   Fluad Quad(high Dose 65+) 05/12/2022   Hepatitis A, Adult 10/17/2014, 04/30/2015   Hepatitis B 04/19/1997, 05/17/1997, 10/18/1997   Influenza Split 05/05/2011, 05/03/2013, 05/30/2014   Influenza,inj,Quad PF,6+ Mos 06/12/2015, 05/09/2018, 04/05/2019, 04/19/2020, 04/15/2021   Influenza-Unspecified 04/22/2016, 05/05/2017   MMR 09/11/2015, 10/10/2015   Moderna Covid-19 Vaccine Bivalent Booster 15yrs & up 06/16/2021   Moderna Sars-Covid-2 Vaccination 10/18/2019, 11/15/2019, 06/03/2020, 03/27/2021   PNEUMOCOCCAL CONJUGATE-20 02/02/2022   Td 10/07/2018   Tdap 09/04/2008   Zoster Recombinant(Shingrix) 07/10/2019, 10/02/2019    TDAP status: Up to date  Flu Vaccine status: Up to date  Pneumococcal vaccine status: Up to date  Covid-19 vaccine status: Completed vaccines  Qualifies for Shingles Vaccine? Yes   Zostavax completed Yes   Shingrix Completed?: Yes  Screening Tests Health Maintenance  Topic Date Due   COVID-19 Vaccine (7 - 2023-24 season) 07/13/2022   INFLUENZA VACCINE  03/04/2023   MAMMOGRAM  07/30/2023   Medicare Annual Wellness (AWV)  03/02/2024   Colonoscopy  03/24/2026   DTaP/Tdap/Td (3 - Td or Tdap) 10/06/2028   Pneumonia Vaccine 44+ Years old  Completed   DEXA SCAN  Completed   Hepatitis C Screening  Completed   Zoster Vaccines- Shingrix  Completed   HPV VACCINES  Aged Out    Health Maintenance  Health Maintenance Due  Topic Date Due   COVID-19 Vaccine (7 - 2023-24 season) 07/13/2022    Colorectal cancer screening: Type of  screening: Colonoscopy. Completed 8/22. Repeat every 5 years  Mammogram status: Completed 12/23. Repeat every year order placed for 2024  Bone Density status: Completed 2/22. Results reflect: Bone density results: OSTEOPENIA. Repeat every 2 years. Wants to check with [employer] first  Lung Cancer Screening: (Low Dose CT Chest recommended if Age 79-80 years, 20 pack-year currently smoking OR have quit w/in 15years.) does not qualify.    Additional Screening:  Hepatitis C Screening: does qualify; Completed 11/16  Vision Screening: Recommended annual ophthalmology exams for early detection of glaucoma and other disorders of the eye. Is the patient up to date with their annual eye exam?  Yes  Who is the provider or what is the name of the office in which the patient attends annual eye exams? [facility] If pt is not established with a provider, would they like to be referred to a provider to establish care? No .   Dental Screening: Recommended annual dental exams for proper oral hygiene   Community Resource Referral / Chronic Care Management: CRR required this visit?  No   CCM required this visit?  No     Plan:     I have personally reviewed and noted the following in the patient's chart:   Medical and social history Use of alcohol, tobacco or illicit drugs  Current medications and supplements including opioid prescriptions. Patient is not currently taking opioid prescriptions. Functional ability and status Nutritional status Physical activity Advanced directives List of other physicians Hospitalizations, surgeries, and ER visits in previous 12 months Vitals Screenings to include cognitive, depression, and falls Referrals and appointments  In addition, I have reviewed and discussed with patient certain preventive protocols, quality metrics, and best practice recommendations. A written personalized care plan for preventive services as well as general preventive  health recommendations were provided to patient.     Sydell Axon, LPN   4/40/1027   After Visit Summary: (MyChart) Due to this being a telephonic visit, the after visit summary with patients personalized plan was offered to patient via MyChart   Nurse Notes: None

## 2023-03-03 NOTE — Patient Instructions (Signed)
Michele Hess , Thank you for taking time to come for your Medicare Wellness Visit. I appreciate your ongoing commitment to your health goals. Please review the following plan we discussed and let me know if I can assist you in the future.   Referrals/Orders/Follow-Ups/Clinician Recommendations: None  This is a list of the screening recommended for you and due dates:  Health Maintenance  Topic Date Due   COVID-19 Vaccine (7 - 2023-24 season) 07/13/2022   Flu Shot  03/04/2023   Mammogram  07/30/2023   Medicare Annual Wellness Visit  03/02/2024   Colon Cancer Screening  03/24/2026   DTaP/Tdap/Td vaccine (3 - Td or Tdap) 10/06/2028   Pneumonia Vaccine  Completed   DEXA scan (bone density measurement)  Completed   Hepatitis C Screening  Completed   Zoster (Shingles) Vaccine  Completed   HPV Vaccine  Aged Out    Advanced directives: (Copy Requested) Please bring a copy of your health care power of attorney and living will to the office to be added to your chart at your convenience.  Next Medicare Annual Wellness Visit scheduled for next year: Yes 03/03/24 @ 8:45  Preventive Care 65 Years and Older, Female Preventive care refers to lifestyle choices and visits with your health care provider that can promote health and wellness. What does preventive care include? A yearly physical exam. This is also called an annual well check. Dental exams once or twice a year. Routine eye exams. Ask your health care provider how often you should have your eyes checked. Personal lifestyle choices, including: Daily care of your teeth and gums. Regular physical activity. Eating a healthy diet. Avoiding tobacco and drug use. Limiting alcohol use. Practicing safe sex. Taking low-dose aspirin every day. Taking vitamin and mineral supplements as recommended by your health care provider. What happens during an annual well check? The services and screenings done by your health care provider during your annual  well check will depend on your age, overall health, lifestyle risk factors, and family history of disease. Counseling  Your health care provider may ask you questions about your: Alcohol use. Tobacco use. Drug use. Emotional well-being. Home and relationship well-being. Sexual activity. Eating habits. History of falls. Memory and ability to understand (cognition). Work and work Astronomer. Reproductive health. Screening  You may have the following tests or measurements: Height, weight, and BMI. Blood pressure. Lipid and cholesterol levels. These may be checked every 5 years, or more frequently if you are over 69 years old. Skin check. Lung cancer screening. You may have this screening every year starting at age 9 if you have a 30-pack-year history of smoking and currently smoke or have quit within the past 15 years. Fecal occult blood test (FOBT) of the stool. You may have this test every year starting at age 53. Flexible sigmoidoscopy or colonoscopy. You may have a sigmoidoscopy every 5 years or a colonoscopy every 10 years starting at age 50. Hepatitis C blood test. Hepatitis B blood test. Sexually transmitted disease (STD) testing. Diabetes screening. This is done by checking your blood sugar (glucose) after you have not eaten for a while (fasting). You may have this done every 1-3 years. Bone density scan. This is done to screen for osteoporosis. You may have this done starting at age 74. Mammogram. This may be done every 1-2 years. Talk to your health care provider about how often you should have regular mammograms. Talk with your health care provider about your test results, treatment options, and if  necessary, the need for more tests. Vaccines  Your health care provider may recommend certain vaccines, such as: Influenza vaccine. This is recommended every year. Tetanus, diphtheria, and acellular pertussis (Tdap, Td) vaccine. You may need a Td booster every 10 years. Zoster  vaccine. You may need this after age 27. Pneumococcal 13-valent conjugate (PCV13) vaccine. One dose is recommended after age 57. Pneumococcal polysaccharide (PPSV23) vaccine. One dose is recommended after age 32. Talk to your health care provider about which screenings and vaccines you need and how often you need them. This information is not intended to replace advice given to you by your health care provider. Make sure you discuss any questions you have with your health care provider. Document Released: 08/16/2015 Document Revised: 04/08/2016 Document Reviewed: 05/21/2015 Elsevier Interactive Patient Education  2017 [employer].  Fall Prevention in the Home Falls can cause injuries. They can happen to people of all ages. There are many things you can do to make your home safe and to help prevent falls. What can I do on the outside of my home? Regularly fix the edges of walkways and driveways and fix any cracks. Remove anything that might make you trip as you walk through a door, such as a raised step or threshold. Trim any bushes or trees on the path to your home. Use bright outdoor lighting. Clear any walking paths of anything that might make someone trip, such as rocks or tools. Regularly check to see if handrails are loose or broken. Make sure that both sides of any steps have handrails. Any raised decks and porches should have guardrails on the edges. Have any leaves, snow, or ice cleared regularly. Use sand or salt on walking paths during winter. Clean up any spills in your garage right away. This includes oil or grease spills. What can I do in the bathroom? Use night lights. Install grab bars by the toilet and in the tub and shower. Do not use towel bars as grab bars. Use non-skid mats or decals in the tub or shower. If you need to sit down in the shower, use a plastic, non-slip stool. Keep the floor dry. Clean up any water that spills on the floor as soon as it happens. Remove  soap buildup in the tub or shower regularly. Attach bath mats securely with double-sided non-slip rug tape. Do not have throw rugs and other things on the floor that can make you trip. What can I do in the bedroom? Use night lights. Make sure that you have a light by your bed that is easy to reach. Do not use any sheets or blankets that are too big for your bed. They should not hang down onto the floor. Have a firm chair that has side arms. You can use this for support while you get dressed. Do not have throw rugs and other things on the floor that can make you trip. What can I do in the kitchen? Clean up any spills right away. Avoid walking on wet floors. Keep items that you use a lot in easy-to-reach places. If you need to reach something above you, use a strong step stool that has a grab bar. Keep electrical cords out of the way. Do not use floor polish or wax that makes floors slippery. If you must use wax, use non-skid floor wax. Do not have throw rugs and other things on the floor that can make you trip. What can I do with my stairs? Do not leave any items  on the stairs. Make sure that there are handrails on both sides of the stairs and use them. Fix handrails that are broken or loose. Make sure that handrails are as long as the stairways. Check any carpeting to make sure that it is firmly attached to the stairs. Fix any carpet that is loose or worn. Avoid having throw rugs at the top or bottom of the stairs. If you do have throw rugs, attach them to the floor with carpet tape. Make sure that you have a light switch at the top of the stairs and the bottom of the stairs. If you do not have them, ask someone to add them for you. What else can I do to help prevent falls? Wear shoes that: Do not have high heels. Have rubber bottoms. Are comfortable and fit you well. Are closed at the toe. Do not wear sandals. If you use a stepladder: Make sure that it is fully opened. Do not climb a  closed stepladder. Make sure that both sides of the stepladder are locked into place. Ask someone to hold it for you, if possible. Clearly mark and make sure that you can see: Any grab bars or handrails. First and last steps. Where the edge of each step is. Use tools that help you move around (mobility aids) if they are needed. These include: Canes. Walkers. Scooters. Crutches. Turn on the lights when you go into a dark area. Replace any light bulbs as soon as they burn out. Set up your furniture so you have a clear path. Avoid moving your furniture around. If any of your floors are uneven, fix them. If there are any pets around you, be aware of where they are. Review your medicines with your doctor. Some medicines can make you feel dizzy. This can increase your chance of falling. Ask your doctor what other things that you can do to help prevent falls. This information is not intended to replace advice given to you by your health care provider. Make sure you discuss any questions you have with your health care provider. Document Released: 05/16/2009 Document Revised: 12/26/2015 Document Reviewed: 08/24/2014 Elsevier Interactive Patient Education  2017 [employer].

## 2023-03-05 ENCOUNTER — Encounter: Payer: Self-pay | Admitting: Cardiovascular Disease

## 2023-03-05 ENCOUNTER — Encounter: Payer: Self-pay | Admitting: Internal Medicine

## 2023-03-05 DIAGNOSIS — Z780 Asymptomatic menopausal state: Secondary | ICD-10-CM

## 2023-03-08 NOTE — Telephone Encounter (Signed)
I have pended the dexa scan for your approval.

## 2023-04-04 ENCOUNTER — Encounter: Payer: Self-pay | Admitting: Internal Medicine

## 2023-04-07 ENCOUNTER — Encounter: Payer: Self-pay | Admitting: Internal Medicine

## 2023-04-07 NOTE — Telephone Encounter (Signed)
Immunization record updated with attached information

## 2023-06-01 ENCOUNTER — Other Ambulatory Visit (INDEPENDENT_AMBULATORY_CARE_PROVIDER_SITE_OTHER): Payer: Medicare PPO

## 2023-06-01 DIAGNOSIS — E785 Hyperlipidemia, unspecified: Secondary | ICD-10-CM | POA: Diagnosis not present

## 2023-06-01 LAB — COMPREHENSIVE METABOLIC PANEL
ALT: 14 U/L (ref 0–35)
AST: 32 U/L (ref 0–37)
Albumin: 4.7 g/dL (ref 3.5–5.2)
Alkaline Phosphatase: 55 U/L (ref 39–117)
BUN: 16 mg/dL (ref 6–23)
CO2: 28 meq/L (ref 19–32)
Calcium: 9.7 mg/dL (ref 8.4–10.5)
Chloride: 101 meq/L (ref 96–112)
Creatinine, Ser: 0.67 mg/dL (ref 0.40–1.20)
GFR: 91.08 mL/min (ref >60.00)
Glucose, Bld: 107 mg/dL — ABNORMAL HIGH (ref 70–99)
Potassium: 4.3 meq/L (ref 3.5–5.1)
Sodium: 138 meq/L (ref 135–145)
Total Bilirubin: 0.5 mg/dL (ref 0.2–1.2)
Total Protein: 7.5 g/dL (ref 6.0–8.3)

## 2023-06-02 LAB — LIPID PANEL W/REFLEX DIRECT LDL
Cholesterol: 161 mg/dL (ref <200)
HDL: 72 mg/dL (ref >=50)
LDL Cholesterol (Calc): 68 mg/dL
Non-HDL Cholesterol (Calc): 89 mg/dL (ref <130)
Total CHOL/HDL Ratio: 2.2 (calc) (ref <5.0)
Triglycerides: 128 mg/dL (ref <150)

## 2023-06-16 NOTE — Telephone Encounter (Signed)
error 

## 2023-08-10 ENCOUNTER — Ambulatory Visit
Admission: RE | Admit: 2023-08-10 | Discharge: 2023-08-10 | Disposition: A | Discharge status: Home / Self Care | Payer: Medicare PPO | Source: Ambulatory Visit | Attending: Internal Medicine | Admitting: Internal Medicine

## 2023-08-10 DIAGNOSIS — Z780 Asymptomatic menopausal state: Secondary | ICD-10-CM

## 2023-08-10 DIAGNOSIS — Z1231 Encounter for screening mammogram for malignant neoplasm of breast: Secondary | ICD-10-CM | POA: Insufficient documentation

## 2023-08-10 DIAGNOSIS — M85851 Other specified disorders of bone density and structure, right thigh: Secondary | ICD-10-CM | POA: Diagnosis not present

## 2023-08-27 NOTE — Progress Notes (Unsigned)
Cardiology Clinic Note   Date: 08/30/2023 ID: Michele Hess, DOB 04-11-1956, MRN [id number]  Primary Cardiologist:  Julien Nordmann, MD  Patient Profile    Michele Hess is a 67 y.o. female who presents to the clinic today for overdue follow up.     Past medical history significant for: Hypertension.  Hyperlipidemia.  CT cardiac scoring 04/17/2022: Coronary calcium score 130 (84th percentile). CAC in LAD (129) and RCA (0.97).  Lipid panel 06/01/2023: LDL 68, HDL 72, TG 128, total 161. GERD.  Family history of early CAD.   In summary, patient was evaluated by Dr. Alvino Chapel  July 2017 for syncope and seizure. She had a similar episode 6 months prior. She had a normal carotid duplex, normal stress echo, normal nuclear stress test and a 24 hour Holter that showed PACs.   Patient was first evaluated by Dr. Mariah Milling on 04/10/2022 for family history of early CAD at the request of Dr. Darrick Huntsman. She underwent CT calcium scoring as above. It was recommended that her lipids be more aggressively managed with change from pravastatin to rosuvastatin and Zetia.      History of Present Illness    Michele Hess is followed by [facility] for the above outlined history.   Today, patient reports episodes of chest pain and increased dyspnea. Patient describes central chest pain that comes on randomly and lasts <2 minutes before resolving on its own. This does not necessarily occur with exertion. She had an episode 2 nights ago while sitting down and another one when she rolled on her left side in bed. Patient does not report associated symptoms. She has noticed getting winded more easily with routine activities like carrying a basket or getting dressed. Dyspnea is brief and resolves with rest.  She denies lower extremity edema. She is active participating in [employer] doing cardio and weights 2 days a week and yoga 1 day a week. She also ran/walked a 5k in November without any chest pain. She has put on some extra  weight over the past few years. She is working on improving her diet and reducing her dietary sodium intake. She is hypertensive today and states she usually is at the doctor's office. She does not check her BP at home.     ROS: All other systems reviewed and are otherwise negative except as noted in History of Present Illness.  EKGs/Labs Reviewed    EKG Interpretation Date/Time:  Monday August 30 2023 13:39:55 EST Ventricular Rate:  95 PR Interval:  138 QRS Duration:  78 QT Interval:  366 QTC Calculation: 459 R Axis:   33  Text Interpretation: Normal sinus rhythm Nonspecific ST abnormality Compared to previous tracing 04/10/2022 No significant change Confirmed by Carlos Levering [phone]) on 08/30/2023 1:47:32 PM   06/01/2023: ALT 14; AST 32; BUN 16; Creatinine, Ser 0.67; Potassium 4.3; Sodium 138   11/09/2022: Hemoglobin 13.6; WBC 6.4   11/09/2022: TSH 2.18    Risk Assessment/Calculations      HYPERTENSION CONTROL Vitals:   08/30/23 1330 08/30/23 1440  BP: (!) 172/99 (!) 150/80    The patient's blood pressure is elevated above target today.  In order to address the patient's elevated BP: The blood pressure is usually elevated in clinic.  Blood pressures monitored at home have been optimal.; Blood pressure will be monitored at home to determine if medication changes need to be made.           Physical Exam    VS:  BP Marland Kitchen)  150/80 (BP Location: Left Arm, Patient Position: Sitting, Cuff Size: Normal)   Pulse 95   Ht 5\' 3"  (1.6 m)   Wt 143 lb 9.6 oz (65.1 kg)   SpO2 98%   BMI 25.44 kg/m  , BMI Body mass index is 25.44 kg/m.  GEN: Well nourished, well developed, in no acute distress. Neck: No JVD or carotid bruits. Cardiac:  RRR. No murmurs. No rubs or gallops.   Respiratory:  Respirations regular and unlabored. Clear to auscultation without rales, wheezing or rhonchi. GI: Soft, nontender, nondistended. Extremities: Radials/DP/PT 2+ and equal bilaterally. No clubbing  or cyanosis. No edema.  Skin: Warm and dry, no rash. Neuro: Strength intact.  Assessment & Plan   Hypertension BP today 172/99 on intake and 150/80 on my recheck. She reports BP normally elevated at the doctor. She does not check her BP at home. She denies headaches, dizziness or vision changes. -Check BP once a day (BP log provided) and bring to follow up.  -Continue amlodipine.  Chest pain Patient reports episodes of central CP that occur randomly and last <2 minutes before resolving on its own. This does not occur with exertion. She has a strong family history of CAD. She is active doing [employer] 3 days a week without chest pain.  -Coronary CTA for further evaluation.  -BMP today.   Dyspnea Patient reports feeling more winded than usual with routine activities such as carrying a basket or getting dressed. This does not happen all of the time but it seems to be happening more frequently.  -Echo for further evaluation.  -CBC today.    Hyperlipidemia CT cardiac score September 2023 showed calcium score 130.  LDL October 2024 68, at goal. -Continue rosuvastatin and Zetia.  Disposition: CBC and BMP today. BP log.  Echo. Coronary CTA. Return in 6-8 weeks or sooner as needed.          Signed, Etta Grandchild. Adelyne Marchese, DNP, NP-C

## 2023-08-30 ENCOUNTER — Ambulatory Visit: Payer: Medicare PPO | Attending: Student | Admitting: Student

## 2023-08-30 ENCOUNTER — Encounter: Payer: Self-pay | Admitting: Student

## 2023-08-30 VITALS — BP 150/80 | HR 95 | Ht 63.0 in | Wt 143.6 lb

## 2023-08-30 DIAGNOSIS — R0609 Other forms of dyspnea: Secondary | ICD-10-CM | POA: Diagnosis not present

## 2023-08-30 DIAGNOSIS — E785 Hyperlipidemia, unspecified: Secondary | ICD-10-CM | POA: Diagnosis not present

## 2023-08-30 DIAGNOSIS — R072 Precordial pain: Secondary | ICD-10-CM | POA: Diagnosis not present

## 2023-08-30 DIAGNOSIS — Z79899 Other long term (current) drug therapy: Secondary | ICD-10-CM | POA: Diagnosis not present

## 2023-08-30 DIAGNOSIS — I10 Essential (primary) hypertension: Secondary | ICD-10-CM

## 2023-08-30 MED ORDER — IVABRADINE HCL 5 MG PO TABS: 10.0000 mg | ORAL_TABLET | Freq: Once | ORAL | 0 refills | Status: AC

## 2023-08-30 MED ORDER — METOPROLOL TARTRATE 100 MG PO TABS: ORAL_TABLET | ORAL | 0 refills | Status: DC

## 2023-08-30 NOTE — Patient Instructions (Addendum)
Medication Instructions:  Your Physician recommend you continue on your current medication as directed.    *If you need a refill on your cardiac medications before your next appointment, please call your pharmacy*   Lab Work: Your provider would like for you to have following labs drawn today CBC and BMeT.   If you have labs (blood work) drawn today and your tests are completely normal, you will receive your results only by: MyChart Message (if you have MyChart) OR A paper copy in the mail If you have any lab test that is abnormal or we need to change your treatment, we will call you to review the results.   Testing/Procedures: Your physician has requested that you have an echocardiogram. Echocardiography is a painless test that uses sound waves to create images of your heart. It provides your doctor with information about the size and shape of your heart and how well your heart's chambers and valves are working.   You may receive an ultrasound enhancing agent through an IV if needed to better visualize your heart during the echo. This procedure takes approximately one hour.  There are no restrictions for this procedure.  This will take place at [address] [facility] [address], [place] [address]  Please note: We ask at that you not bring children with you during ultrasound (echo/ vascular) testing. Due to room size and safety concerns, children are not allowed in the ultrasound rooms during exams. Our front office staff cannot provide observation of children in our lobby area while testing is being conducted. An adult accompanying a patient to their appointment will only be allowed in the ultrasound room at the discretion of the ultrasound technician under special circumstances. We apologize for any inconvenience.     Your cardiac CT will be scheduled at one of the below locations:   [facility] [address] [place],  [place] [address] [phone]  If scheduled at [facility] please arrive 15 mins early for check-in and test prep.  Please follow these instructions carefully (unless otherwise directed):  An IV will be required for this test and Nitroglycerin will be given.   On the Night Before the Test: Be sure to Drink plenty of water. Do not consume any caffeinated/decaffeinated beverages or chocolate 12 hours prior to your test. Do not take any antihistamines 12 hours prior to your test.  On the Day of the Test: Drink plenty of water until 1 hour prior to the test. Do not eat any food 1 hour prior to test. You may take your regular medications prior to the test.  Take Ivabradine 10mg  PO in combination with metoprolol tartrate 100 mg  two hours prior to test. If you take Furosemide/Hydrochlorothiazide/Spironolactone/Chlorthalidone, please HOLD on the morning of the test. Patients who wear a continuous glucose monitor MUST remove the device prior to scanning. FEMALES- please wear underwire-free bra if available, avoid dresses & tight clothing       After the Test: Drink plenty of water. After receiving IV contrast, you may experience a mild flushed feeling. This is normal. On occasion, you may experience a mild rash up to 24 hours after the test. This is not dangerous. If this occurs, you can take Benadryl 25 mg and increase your fluid intake. If you experience trouble breathing, this can be serious. If it is severe call 911 IMMEDIATELY. If it is mild, please call our office.  We will call to schedule your  test 2-4 weeks out understanding that some insurance companies will need an authorization prior to the service being performed.   For more information and frequently asked questions, please visit our website : [email or web address]  For non-scheduling related questions, please contact the cardiac imaging nurse navigator should you have any  questions/concerns: Cardiac Imaging Nurse Navigators Direct Office Dial: [phone]   For scheduling needs, including cancellations and rescheduling, please call [place], [phone].   Follow-Up: At [facility], you and your health needs are our priority.  As part of our continuing mission to provide you with exceptional heart care, we have created designated Provider Care Teams.  These Care Teams include your primary Cardiologist (physician) and Advanced Practice Providers (APPs -  Physician Assistants and Nurse Practitioners) who all work together to provide you with the care you need, when you need it.  Your next appointment:   6-8 week(s)  Provider:   You may see Julien Nordmann, MD or one of the following Advanced Practice Providers on your designated Care Team:   Carlos Levering, NP   Other: Blood Pressure log provided

## 2023-08-31 LAB — CBC
Hematocrit: 44.9 % (ref 34.0–46.6)
Hemoglobin: 14.8 g/dL (ref 11.1–15.9)
MCH: 29.4 pg (ref 26.6–33.0)
MCHC: 33 g/dL (ref 31.5–35.7)
MCV: 89 fL (ref 79–97)
Platelets: 354 10*3/uL (ref 150–450)
RBC: 5.03 x10E6/uL (ref 3.77–5.28)
RDW: 12.6 % (ref 11.7–15.4)
WBC: 7.5 10*3/uL (ref 3.4–10.8)

## 2023-08-31 LAB — BASIC METABOLIC PANEL
BUN/Creatinine Ratio: 17 (ref 12–28)
BUN: 11 mg/dL (ref 8–27)
CO2: 23 mmol/L (ref 20–29)
Calcium: 11 mg/dL — ABNORMAL HIGH (ref 8.7–10.3)
Chloride: 100 mmol/L (ref 96–106)
Creatinine, Ser: 0.64 mg/dL (ref 0.57–1.00)
Glucose: 97 mg/dL (ref 70–99)
Potassium: 5 mmol/L (ref 3.5–5.2)
Sodium: 140 mmol/L (ref 134–144)
eGFR: 97 mL/min/{1.73_m2} (ref >59)

## 2023-09-14 ENCOUNTER — Ambulatory Visit: Payer: Medicare PPO | Attending: Student

## 2023-09-14 ENCOUNTER — Other Ambulatory Visit: Payer: Self-pay

## 2023-09-14 DIAGNOSIS — R0609 Other forms of dyspnea: Secondary | ICD-10-CM

## 2023-09-14 LAB — ECHOCARDIOGRAM COMPLETE
AV Mean grad: 2 mm[Hg]
AV Peak grad: 4.3 mm[Hg]
Ao pk vel: 1.04 m/s
Area-P 1/2: 3.6 cm2

## 2023-09-14 MED ORDER — IVABRADINE HCL 5 MG PO TABS
ORAL_TABLET | ORAL | 0 refills | Status: DC
  Filled 2023-09-14: qty 2, 1d supply, fill #0

## 2023-09-16 ENCOUNTER — Ambulatory Visit
Admission: RE | Admit: 2023-09-16 | Discharge: 2023-09-16 | Disposition: A | Discharge status: Home / Self Care | Payer: Medicare PPO | Source: Ambulatory Visit | Attending: Student | Admitting: Student

## 2023-09-16 DIAGNOSIS — R072 Precordial pain: Secondary | ICD-10-CM

## 2023-09-16 MED ORDER — SODIUM CHLORIDE 0.9 % IV BOLUS
150.0000 mL | Freq: Once | INTRAVENOUS | Status: AC
  Administered 2023-09-16: 100 mL via INTRAVENOUS

## 2023-09-16 MED ORDER — IOHEXOL 350 MG/ML SOLN
75.0000 mL | Freq: Once | INTRAVENOUS | Status: AC | PRN
  Administered 2023-09-16: 75 mL via INTRAVENOUS

## 2023-09-16 MED ORDER — NITROGLYCERIN 0.4 MG SL SUBL
0.8000 mg | SUBLINGUAL_TABLET | Freq: Once | SUBLINGUAL | Status: AC
  Administered 2023-09-16: 0.8 mg via SUBLINGUAL

## 2023-09-16 NOTE — Progress Notes (Signed)
Patient tolerated procedure well. Ambulate w/o difficulty. Denies light headedness or being dizzy. Sitting up drinking water provided. Encouraged to drink extra water today and reasoning explained. Verbalized understanding. All questions answered. ABC intact. No further needs. Discharge from procedure area w/o issues.

## 2023-10-08 ENCOUNTER — Encounter: Payer: Self-pay | Admitting: Internal Medicine

## 2023-10-10 ENCOUNTER — Other Ambulatory Visit: Payer: Self-pay | Admitting: Internal Medicine

## 2023-10-12 ENCOUNTER — Ambulatory Visit: Payer: Medicare PPO | Admitting: Student

## 2023-10-18 NOTE — Progress Notes (Unsigned)
 Cardiology Clinic Note   Date: 10/21/2023 ID: Lynzy, Rawles 1955/11/27, MRN [id number]  Primary Cardiologist:  Julien Nordmann, MD  Chief Complaint   Michele Hess is a 68 y.o. female who presents to the clinic today for follow up after testing.   Patient Profile   Michele Hess is followed by Dr. Mariah Milling for the history outlined below.      Past medical history significant for: Chest pain/dyspnea. Echo 09/14/2023: EF 60 to 65%.  No RWMA.  Grade I DD.  Normal global strain.  Normal RV size/function.  Aortic valve sclerosis without stenosis. Coronary CTA 09/16/2023: Coronary calcium score 155 (84th percentile).  Mild proximal LAD stenosis, minimal proximal RCA stenosis. Hypertension.  Hyperlipidemia.  CT cardiac scoring 04/17/2022: Coronary calcium score 130 (84th percentile). CAC in LAD (129) and RCA (0.97).  Lipid panel 06/01/2023: LDL 68, HDL 72, TG 128, total 161. GERD.  Family history of early CAD.   In summary, patient was evaluated by Dr. Alvino Chapel  July 2017 for syncope and seizure. She had a similar episode 6 months prior. She had a normal carotid duplex, normal stress echo, normal nuclear stress test and a 24 hour Holter that showed PACs.   Patient was first evaluated by Dr. Mariah Milling on 04/10/2022 for family history of early CAD at the request of Dr. Darrick Huntsman. She underwent CT calcium scoring as above. It was recommended that her lipids be more aggressively managed with change from pravastatin to rosuvastatin and Zetia.    Patient was last seen in the office by me on 08/30/2023 with complaints of chest pain and increased dyspnea.  Patient described central chest pain coming on randomly and lasting less than 2 minutes before resolving on its own.  Pain did not necessarily occur with exertion.  She reported getting winded more easily with routine activities like carrying a basket or getting dressed.  Dyspnea was brief and resolved with rest.  She denied lower extremity edema.  She was  active participating in [employer] doing cardio and weights 2 days a week and yoga 1 day a week.  Her BP was elevated at the time of her visit and she was instructed to maintain a BP log for follow-up.  She underwent echo and coronary CTA as detailed above.     History of Present Illness    Today, patient reports overall she is doing well. She continues to have occasional episodes of chest pain and dyspnea particularly at night. She cannot correlate the pain with any particular foods. She has a visit with GI scheduled for April. Her BP is elevated. She brings her BP log today which reflects well controlled BP at home (will scan into chart). She remains active without limitations.     ROS: All other systems reviewed and are otherwise negative except as noted in History of Present Illness.  EKGs/Labs Reviewed       EKG is not ordered today.   06/01/2023: ALT 14; AST 32 08/30/2023: BUN 11; Creatinine, Ser 0.64; Potassium 5.0; Sodium 140   08/30/2023: Hemoglobin 14.8; WBC 7.5   11/09/2022: TSH 2.18   Physical Exam    VS:  BP 130/80 (BP Location: Left Arm, Patient Position: Sitting, Cuff Size: Normal)   Pulse 88   Ht 5\' 3"  (1.6 m)   Wt 141 lb (64 kg)   SpO2 97%   BMI 24.98 kg/m  , BMI Body mass index is 24.98 kg/m.  GEN: Well nourished, well developed, in no acute  distress. Neck: No JVD or carotid bruits. Cardiac:  RRR. No murmurs. No rubs or gallops.   Respiratory:  Respirations regular and unlabored. Clear to auscultation without rales, wheezing or rhonchi. GI: Soft, nontender, nondistended. Extremities: Radials/DP/PT 2+ and equal bilaterally. No clubbing or cyanosis. No edema.  Skin: Warm and dry, no rash. Neuro: Strength intact.  Assessment & Plan   Hypertension BP today 168/98 on intake and 130/80 on my recheck.  Home BP well controlled. Will scan log into chart.  -Continue amlodipine.   Chest pain/dyspnea Echo demonstrated normal LV/RV function, no RWMA, Grade I  DD, aortic valve sclerosis without stenosis.  Coronary CTA with calcium score 155, mild proximal LAD stenosis, minimal proximal RCA stenosis.  Patient reports continued episodes of central chest pain and dyspnea particularly at night. She is pending GI evaluation in April.  -No further testing indicated. -Continue aspirin, rosuvastatin, Zetia.   Hyperlipidemia LDL October 2024 68, at goal. -Continue rosuvastatin and Zetia.  Disposition: Return in 1 year or sooner as needed.          Signed, Etta Grandchild. Cinda Hara, DNP, NP-C

## 2023-10-21 ENCOUNTER — Encounter: Payer: Self-pay | Admitting: Student

## 2023-10-21 ENCOUNTER — Ambulatory Visit: Attending: Student | Admitting: Student

## 2023-10-21 VITALS — BP 130/80 | HR 88 | Ht 63.0 in | Wt 141.0 lb

## 2023-10-21 DIAGNOSIS — I10 Essential (primary) hypertension: Secondary | ICD-10-CM | POA: Diagnosis not present

## 2023-10-21 DIAGNOSIS — E785 Hyperlipidemia, unspecified: Secondary | ICD-10-CM

## 2023-10-21 DIAGNOSIS — R0602 Shortness of breath: Secondary | ICD-10-CM | POA: Diagnosis not present

## 2023-10-21 DIAGNOSIS — R079 Chest pain, unspecified: Secondary | ICD-10-CM

## 2023-10-21 NOTE — Patient Instructions (Signed)
 Medication Instructions:  No changes *If you need a refill on your cardiac medications before your next appointment, please call your pharmacy*   Lab Work: None ordered If you have labs (blood work) drawn today and your tests are completely normal, you will receive your results only by: MyChart Message (if you have MyChart) OR A paper copy in the mail If you have any lab test that is abnormal or we need to change your treatment, we will call you to review the results.   Testing/Procedures: None ordered   Follow-Up: At [facility], you and your health needs are our priority.  As part of our continuing mission to provide you with exceptional heart care, we have created designated Provider Care Teams.  These Care Teams include your primary Cardiologist (physician) and Advanced Practice Providers (APPs -  Physician Assistants and Nurse Practitioners) who all work together to provide you with the care you need, when you need it.  We recommend signing up for the patient portal called "MyChart".  Sign up information is provided on this After Visit Summary.  MyChart is used to connect with patients for Virtual Visits (Telemedicine).  Patients are able to view lab/test results, encounter notes, upcoming appointments, etc.  Non-urgent messages can be sent to your provider as well.   To learn more about what you can do with MyChart, go to [email or web address].    Your next appointment:   12 month(s)  Provider:   You may see Julien Nordmann, MD or one of the following Advanced Practice Providers on your designated Care Team:   Carlos Levering, NP

## 2023-10-22 DIAGNOSIS — H524 Presbyopia: Secondary | ICD-10-CM | POA: Diagnosis not present

## 2023-10-22 DIAGNOSIS — H5203 Hypermetropia, bilateral: Secondary | ICD-10-CM | POA: Diagnosis not present

## 2023-10-22 DIAGNOSIS — H2513 Age-related nuclear cataract, bilateral: Secondary | ICD-10-CM | POA: Diagnosis not present

## 2023-11-02 ENCOUNTER — Other Ambulatory Visit: Payer: Self-pay | Admitting: Internal Medicine

## 2023-11-02 ENCOUNTER — Telehealth: Payer: Self-pay | Admitting: Internal Medicine

## 2023-11-02 NOTE — Telephone Encounter (Signed)
 Dr Darrick Huntsman will not be in the office on the day of your scheduled visit, 12/03/23. Please call the office to reschedule at [phone].   Thank you  E2C2, please reschedule this patient's visit. [facility]

## 2023-11-04 NOTE — Telephone Encounter (Signed)
 Pt was rescheduled incorrectly by our scheduling team. Please call pt to mover her CPE from the lab schedule on 12/01/23.  Thanks

## 2023-11-04 NOTE — Telephone Encounter (Signed)
 Noted, thanks!

## 2023-11-06 DIAGNOSIS — J0100 Acute maxillary sinusitis, unspecified: Secondary | ICD-10-CM | POA: Diagnosis not present

## 2023-11-26 ENCOUNTER — Ambulatory Visit: Admitting: Nurse Practitioner

## 2023-11-28 NOTE — Progress Notes (Addendum)
 11/28/2023 Michele Hess [id number] September 07, 1955   Chief Complaint: GERD follow up  History of Present Illness: Michele Hess is a 68 year old female with a past medical history of anxiety, osteopenia, GERD and colon polyps. She is known by Dr. General Kenner.  She presents today for further evaluation regarding suspected GERD symptoms.  She endorses having episodes of mid chest/esophageal burning which started in the fall 2024 which occurs randomly mostly in the evening and nighttime.  No associated nausea or vomiting.  She sometimes has DOE without chest pain when walking up stairs. Her esophageal/chest burning episodes are unassociated with exertion or eating but can be triggered at nighttime when turning on the left side and less frequently on the right side and last for 10 to 12 minutes then abates.  Her last episode occurred 2 weeks ago.  She sometimes has reflux after drinking coffee.  No dysphagia.  She has postnasal drainage.  She takes ASA 81 mg 2 days weekly.  She is not taking a PPI or H2 blocker, she infrequently takes Tums for reflux symptoms.  No upper or lower abdominal pain.  She underwent an EGD 12/13/2015 which identified a 1 cm hiatal hernia and mild superficial chronic inflammation to the stomach without evidence of H. pylori.  She is passing normal brown bowel movements daily.  No bloody or black stools.  Her most recent colonoscopy was 03/24/2021 which identified 4 polyps removed from the colon and rectum, path report was consistent with sessile serrated and hyperplastic polyps.    She was seen by cardiology due to having atypical chest pain in dyspnea as noted above. ECHO 09/14/2023 showed normal LV/RV function, no RWMA, Grade I DD, aortic valve sclerosis without stenosis.  Coronary CTA  09/16/2023 with a calcium  score 155, mild proximal LAD stenosis and minimal proximal RCA stenosis.  She was instructed to continue aspirin, Rosuvastatin  and Zetia .  No further cardiac testing was  recommended.  Mother, father and brother with history of heart disease/MI.     Latest Ref Rng & Units 08/30/2023    3:09 PM 11/09/2022    8:02 AM 04/29/2022    9:43 AM  CBC  WBC 3.4 - 10.8 x10E3/uL 7.5  6.4  4.9   Hemoglobin 11.1 - 15.9 g/dL 40.9  81.1  91.4   Hematocrit 34.0 - 46.6 % 44.9  40.0  39.4   Platelets 150 - 450 x10E3/uL 354  320.0  300.0        Latest Ref Rng & Units 08/30/2023    3:09 PM 06/01/2023    7:49 AM 11/09/2022    8:02 AM  CMP  Glucose 70 - 99 mg/dL 97  782  89   BUN 8 - 27 mg/dL 11  16  13    Creatinine 0.57 - 1.00 mg/dL 9.56  2.13  0.86   Sodium 134 - 144 mmol/L 140  138  139   Potassium 3.5 - 5.2 mmol/L 5.0  4.3  4.8   Chloride 96 - 106 mmol/L 100  101  102   CO2 20 - 29 mmol/L 23  28  26    Calcium  8.7 - 10.3 mg/dL 57.8  9.7  9.7   Total Protein 6.0 - 8.3 g/dL  7.5  7.1   Total Bilirubin 0.2 - 1.2 mg/dL  0.5  0.4   Alkaline Phos 39 - 117 U/L  55  60   AST 0 - 37 U/L  32  18   ALT 0 -  35 U/L  14  6        Latest Ref Rng & Units 06/01/2023    7:49 AM 11/09/2022    8:02 AM 07/15/2022    8:17 AM  Hepatic Function  Total Protein 6.0 - 8.3 g/dL 7.5  7.1  7.3   Albumin 3.5 - 5.2 g/dL 4.7  4.7  4.8   AST 0 - 37 U/L 32  18  25   ALT 0 - 35 U/L 14  6  11    Alk Phosphatase 39 - 117 U/L 55  60  57   Total Bilirubin 0.2 - 1.2 mg/dL 0.5  0.4  0.5     ECHO 09/14/2023: Left ventricular ejection fraction, by estimation, is 60 to 65%. Left ventricular ejection fraction by 3D volume is 60 %. The left ventricle has normal function. The left ventricle has no regional wall motion abnormalities. Left ventricular diastolic parameters are consistent with Grade I diastolic dysfunction (impaired relaxation). The average left ventricular global longitudinal strain is -18.6 %. The global longitudinal strain is normal. 1. 2. Right ventricular systolic function is normal. The right ventricular size is normal. The mitral valve is normal in structure. No evidence of mitral valve  regurgitation. No evidence of mitral stenosis. 3. The aortic valve is normal in structure. Aortic valve regurgitation is not visualized. Aortic valve sclerosis is present, with no evidence of aortic valve stenosis. 4. The inferior vena cava is normal in size with greater than 50% respiratory variability, suggesting right atrial pressure of 3 mmHg.  PAST GI PROCEDURES:  Colonoscopy 03/24/2021: - One 5 mm polyp at the splenic flexure, removed with a cold snare. Resected and retrieved.  - One 3 mm polyp in the descending colon, removed with a cold snare. Resected and retrieved.  - Two 2 to 3 mm polyps in the proximal rectum.  - Diverticulosis in the sigmoid colon.  - The examination was otherwise normal. - 5 Year recall colonoscopy  Surgical [P], colon, descending and splenic flexure and rectal-2, polyp (4) - SESSILE SERRATED POLYP (1 OF 3 FRAGMENTS) - HYPERPLASTIC POLYP (2 OF 3 FRAGMENTS) - NO HIGH-GRADE DYSPLASIA OR MALIGNANCY IDENTIFIED  Colonoscopy 02/09/2018: - Perianal skin tags found on perianal exam.  - Two 6 to 10 mm polyps in the transverse colon, removed with a cold snare. Resected and retrieved.  - Anal papilla(e) were hypertrophied.  - The examination was otherwise normal. - 3 Year recall colonoscopy  Surgical [P], transverse, polyp (2) - MULTIPLE FRAGMENTS OF TUBULAR ADENOMA(S) - NO HIGH GRADE DYSPLASIA OR MALIGNANCY IDENTIFIED  EGD 12/13/2015: - Esophagogastric landmarks identified.  - 1 cm hiatal hernia. Otherwise no pathology noted to cause dysphagia, no stenosis / stricture, biopsies taken to rule out EoE.  - Erythematous mucosa in the antrum. Biopsied.  - Normal duodenal bulb and second portion of the duodenum.  - Biopsies were taken with a cold forceps for histology in the middle third of the esophagus and in the lower third of the esophagus. 1. Surgical [P], gastric antrum and gastric body - BENIGN GASTRIC MUCOSA WITH MILD SUPERFICIAL CHRONIC INFLAMMATION AND REACTIVE  EPITHELIAL CHANGES. - NO INTESTINAL METAPLASIA OR HELICOBACTER PYLORI ORGANISMS IDENTIFIED. 2. Surgical [P], mid esophagus and distal esophagus - BENIGN SQUAMOUS MUCOSA WITH FOCAL REACTIVE EPITHELIAL CHANGES. - NO INTESTINAL METAPLASIA OR DYSPLASIA IDENTIFIED.  Colonoscopy 10/21/2007 - no polyps   Past Medical History:  Diagnosis Date   Anxiety    "white coat syndrome"   GERD (gastroesophageal reflux disease)  History of ETT    myoview  7/09: 9 min 31 sec, EF 71%, nov evidence for ischemia. ETT 99/10)" 1030", excellent exercise tolerance, no abnormal rhythms, no evidence for ischemia   HLD (hyperlipidemia)    Menopausal and postmenopausal disorder    Osteopenia    Seasonal allergies    Seizure (HCC) 01/2016   Dr. Tilda Fogo- per pt- MD told her is was not a seizure   Syncope and collapse 01/27/2016   Past Surgical History:  Procedure Laterality Date   ANTERIOR CRUCIATE LIGAMENT REPAIR Left 2011   COLONOSCOPY  2019   SA-MAC-prep good-TA's-recall 3 yrs   echo (other)  04/2009   EF 60%, lv normal, RV normal, mild MR, trivial pericardial effusion    LUMBAR LAMINECTOMY  10/2020   PARTIAL HYSTERECTOMY     PELVIC LAPAROSCOPY  1987   POLYPECTOMY  2019   TA's   TONSILLECTOMY  1961   TOTAL ABDOMINAL HYSTERECTOMY  1993   Grier Leber   WISDOM TOOTH EXTRACTION     Current Outpatient Medications on File Prior to Visit  Medication Sig Dispense Refill   amLODipine  (NORVASC ) 2.5 MG tablet TAKE 1 TABLET BY MOUTH EVERY DAY 90 tablet 3   aspirin EC 81 MG tablet Take 81 mg by mouth. Swallow whole. Twice weekly     diclofenac  Sodium (VOLTAREN ) 1 % GEL Apply 4 g topically 3 (three) times daily as needed. 50 g 0   ezetimibe  (ZETIA ) 10 MG tablet TAKE 1 TABLET BY MOUTH EVERY DAY 90 tablet 3   Multiple Vitamins-Minerals (EMERGEN-C VITAMIN C) PACK Take 1 packet by mouth as needed.     rosuvastatin  (CRESTOR ) 20 MG tablet Take 1 tablet (20 mg total) by mouth daily. 90 tablet 3   UNABLE TO FIND Take 1  tablet by mouth daily. Nature Made Calcium  1200 mg and Vitamin D3 1000 mg     Lactobacillus (PROBIOTIC ACIDOPHILUS PO) Take 1 capsule by mouth daily. (Patient not taking: Reported on 11/29/2023)     promethazine  (PHENERGAN ) 12.5 MG tablet Take 1 tablet (12.5 mg total) by mouth every 6 (six) hours as needed for nausea or vomiting. (Patient not taking: Reported on 11/29/2023) 30 tablet 0   Current Facility-Administered Medications on File Prior to Visit  Medication Dose Route Frequency Provider Last Rate Last Admin   0.9 %  sodium chloride  infusion  500 mL Intravenous Once Armbruster, Lendon Queen, MD       Allergies  Allergen Reactions   Neomycin    Current Medications, Allergies, Past Medical History, Past Surgical History, Family History and Social History were reviewed in [employer] medical record.  Review of Systems:   Constitutional: Negative for fever, sweats, chills or weight loss.  Respiratory: + DOE.  Cardiovascular: See HPI. Gastrointestinal: See HPI.  Musculoskeletal: Negative for back pain or muscle aches.  Neurological: Negative for dizziness, headaches or paresthesias.   Physical Exam: BP (!) 174/90 (BP Location: Left Arm, Patient Position: Sitting, Cuff Size: Normal)   Pulse 92   Ht 5' 2.5" (1.588 m)   Wt 139 lb 8 oz (63.3 kg)   BMI 25.11 kg/m   General: 68 year old female in no acute distress. Head: Normocephalic and atraumatic. Eyes: No scleral icterus. Conjunctiva pink . Ears: Normal auditory acuity. Mouth: Dentition intact. No ulcers or lesions.  Lungs: Clear throughout to auscultation. Heart: Regular rate and rhythm, no murmur. Abdomen: Soft, nontender and nondistended. No masses or hepatomegaly. Normal bowel sounds x 4 quadrants.  Rectal: Deferred.  Musculoskeletal:  Symmetrical with no gross deformities. Extremities: No edema. Neurological: Alert oriented x 4. No focal deficits.  Psychological: Alert and cooperative. Normal mood and  affect  Assessment and Recommendations:  68 year old female with atypical chest/esophageal burning pain which occurs mostly in the evening and at nighttime and can be triggered by turning on the left side and less frequently to the right side and unrelated to eating. Cardiac vs esophageal vs biliary etiology. Seen by cardiology, ECHO 09/14/2023 showed normal LV/RV function, no RWMA, Grade I DD, aortic valve sclerosis without stenosis. Coronary CTA  09/16/2023 with a calcium  score 155, mild proximal LAD stenosis and minimal proximal RCA stenosis. Remote EGD in 2017 identified a 1 cm hiatal hernia and mild superficial chronic inflammation to the stomach without evidence of H. pylori. On ASA 8mg  twice weekly. -Omeprazole  20 mg 1 tab to be taken 30 minutes before breakfast -Gaviscon 1 tablespoon p.o. 3 times daily as needed -EGD benefits and risks discussed including risk with sedation, risk of bleeding, perforation and infection.  Cardiac clearance requested prior to the patient proceeding with an EGD. -RUQ sono to rule out gallstones or biliary ductal abnormality  -Patient instructed to go to go to emergency room if she develops significant chest pain -Patient scheduled to have labs done by her PCP this week, request CBC and CMP  History of colon polyps. Her most recent colonoscopy was 03/24/2021 which identified 4 polyps removed from the colon and rectum, path report was consistent with sessile serrated and hyperplastic polyps.  No known family history of colon polyps or colorectal cancer. - Next colon polyp surveillance colonoscopy due 03/2026  Addendum: Cardiac clearance received for Lawana Pray cardiology NP 11/29/2023:  Primary Cardiologist: Timothy Gollan, MD   Chart reviewed as part of pre-operative protocol coverage. Given past medical history and time since last visit, based on ACC/AHA guidelines, ZAYNAB ATIENZA would be at acceptable risk for the planned procedure without further  cardiovascular testing.    Prior Morey Ar NP-C    "Patient is able to achieve >4 METs.  She recently had coronary CTA which showed nonobstructive CAD and echo with normal LV function. She is an acceptable risk for endoscopy without further testing. "     I will route this recommendation to the requesting party via Epic fax function and remove from pre-op pool.   Please call with questions.   Chet Cota. Cleaver NP-C       11/29/2023, 12:00 PM

## 2023-11-29 ENCOUNTER — Encounter: Payer: Self-pay | Admitting: Nurse Practitioner

## 2023-11-29 ENCOUNTER — Telehealth: Payer: Self-pay

## 2023-11-29 ENCOUNTER — Ambulatory Visit: Admitting: Nurse Practitioner

## 2023-11-29 VITALS — BP 174/90 | HR 92 | Ht 62.5 in | Wt 139.5 lb

## 2023-11-29 DIAGNOSIS — Z860101 Personal history of adenomatous and serrated colon polyps: Secondary | ICD-10-CM | POA: Diagnosis not present

## 2023-11-29 DIAGNOSIS — R0789 Other chest pain: Secondary | ICD-10-CM | POA: Diagnosis not present

## 2023-11-29 DIAGNOSIS — K219 Gastro-esophageal reflux disease without esophagitis: Secondary | ICD-10-CM | POA: Diagnosis not present

## 2023-11-29 MED ORDER — OMEPRAZOLE 20 MG PO CPDR: 20.0000 mg | DELAYED_RELEASE_CAPSULE | Freq: Every day | ORAL | 1 refills | Status: AC

## 2023-11-29 NOTE — Patient Instructions (Signed)
 You will be contacted by [facility] in the next 2 days to arrange an ultrasound.  The number on your caller ID will be [phone], please answer when they call.  If you have not heard from them in 2 days please call [phone] to schedule.    You have been scheduled for an endoscopy. Please follow written instructions given to you at your visit today.  If you use inhalers (even only as needed), please bring them with you on the day of your procedure. ___________________________________________________________________________  We have sent the following medications to your pharmacy for you to pick up at your convenience: Omeprazole  20 mg  Gaviscon liquid- take 1 tablespoon by mouth daily  Please have CBC and CMP labs done with your primary care doctor this week.  Due to recent changes in healthcare laws, you may see the results of your imaging and laboratory studies on MyChart before your provider has had a chance to review them.  We understand that in some cases there may be results that are confusing or concerning to you. Not all laboratory results come back in the same time frame and the provider may be waiting for multiple results in order to interpret others.  Please give us  48 hours in order for your provider to thoroughly review all the results before contacting the office for clarification of your results.   Thank you for trusting me with your gastrointestinal care!   Everett Hitt, CRNP

## 2023-11-29 NOTE — Progress Notes (Signed)
 Agree with assessment and plan as outlined.

## 2023-11-29 NOTE — Telephone Encounter (Signed)
     Primary Cardiologist: Timothy Gollan, MD  Chart reviewed as part of pre-operative protocol coverage. Given past medical history and time since last visit, based on ACC/AHA guidelines, DURENDA LAROSA would be at acceptable risk for the planned procedure without further cardiovascular testing.   Prior Morey Ar NP-C   "Patient is able to achieve >4 METs.  She recently had coronary CTA which showed nonobstructive CAD and echo with normal LV function. She is an acceptable risk for endoscopy without further testing. "   I will route this recommendation to the requesting party via Epic fax function and remove from pre-op pool.  Please call with questions.  Chet Cota. Laruth Hanger NP-C     11/29/2023, 12:00 PM [facility] [address] Office [phone] Fax [phone]

## 2023-11-29 NOTE — Telephone Encounter (Signed)
 Michele Hess,  Michele Hess is requesting preoperative cardiac evaluation for endoscopy.  Procedure is scheduled for 01/17/2024.  She was recently seen by you in clinic.  Would you be able to provide recommendations on cardiac risk for upcoming procedure?  Thank you for your help.  Please direct your response to [facility] preop pool.  Chet Cota. Shatina Streets NP-C     11/29/2023, 11:15 AM [facility] [address] Office [phone] Fax [phone]

## 2023-11-29 NOTE — Telephone Encounter (Signed)
 [facility] Pre-operative Risk Assessment     Request for surgical clearance:     Endoscopy Procedure  What type of surgery is being performed?     Endoscopy  When is this surgery scheduled?     01/17/24  What type of clearance is required ?   Medical   Are there any medications that need to be held prior to surgery and how long? N/A  Practice name and name of physician performing surgery?      [facility]  What is your office phone and fax number?      Phone- [phone]  Fax- [phone]  Anesthesia type (None, local, MAC, general) ?       MAC   Please route your response to Elwanda Moger, CMA

## 2023-11-30 ENCOUNTER — Encounter: Payer: Self-pay | Admitting: Internal Medicine

## 2023-11-30 DIAGNOSIS — R5383 Other fatigue: Secondary | ICD-10-CM

## 2023-11-30 NOTE — Telephone Encounter (Signed)
 Patient is needing a call back today regarding some extra labs she done before her appt she would like a call back to see if those can be ordered

## 2023-12-01 ENCOUNTER — Other Ambulatory Visit (INDEPENDENT_AMBULATORY_CARE_PROVIDER_SITE_OTHER)

## 2023-12-01 DIAGNOSIS — R5383 Other fatigue: Secondary | ICD-10-CM

## 2023-12-01 LAB — COMPREHENSIVE METABOLIC PANEL WITH GFR
ALT: 14 U/L (ref 0–35)
AST: 29 U/L (ref 0–37)
Albumin: 4.6 g/dL (ref 3.5–5.2)
Alkaline Phosphatase: 58 U/L (ref 39–117)
BUN: 13 mg/dL (ref 6–23)
CO2: 28 meq/L (ref 19–32)
Calcium: 9.5 mg/dL (ref 8.4–10.5)
Chloride: 99 meq/L (ref 96–112)
Creatinine, Ser: 0.61 mg/dL (ref 0.40–1.20)
GFR: 92.83 mL/min (ref >60.00)
Glucose, Bld: 107 mg/dL — ABNORMAL HIGH (ref 70–99)
Potassium: 4.3 meq/L (ref 3.5–5.1)
Sodium: 136 meq/L (ref 135–145)
Total Bilirubin: 0.6 mg/dL (ref 0.2–1.2)
Total Protein: 7.3 g/dL (ref 6.0–8.3)

## 2023-12-01 LAB — CBC WITH DIFFERENTIAL/PLATELET
Basophils Absolute: 0 10*3/uL (ref 0.0–0.1)
Basophils Relative: 0.3 % (ref 0.0–3.0)
Eosinophils Absolute: 0.1 10*3/uL (ref 0.0–0.7)
Eosinophils Relative: 2.1 % (ref 0.0–5.0)
HCT: 41.5 % (ref 36.0–46.0)
Hemoglobin: 13.6 g/dL (ref 12.0–15.0)
Lymphocytes Relative: 40.2 % (ref 12.0–46.0)
Lymphs Abs: 2.1 10*3/uL (ref 0.7–4.0)
MCHC: 32.8 g/dL (ref 30.0–36.0)
MCV: 89.9 fl (ref 78.0–100.0)
Monocytes Absolute: 0.6 10*3/uL (ref 0.1–1.0)
Monocytes Relative: 12.1 % — ABNORMAL HIGH (ref 3.0–12.0)
Neutro Abs: 2.4 10*3/uL (ref 1.4–7.7)
Neutrophils Relative %: 45.3 % (ref 43.0–77.0)
Platelets: 319 10*3/uL (ref 150.0–400.0)
RBC: 4.62 Mil/uL (ref 3.87–5.11)
RDW: 13.8 % (ref 11.5–15.5)
WBC: 5.3 10*3/uL (ref 4.0–10.5)

## 2023-12-01 LAB — TSH: TSH: 1.46 u[IU]/mL (ref 0.35–5.50)

## 2023-12-01 NOTE — Addendum Note (Signed)
 Addended by: Thressa Flora D on: 12/01/2023 08:11 AM   Modules accepted: Orders

## 2023-12-02 ENCOUNTER — Telehealth: Payer: Self-pay

## 2023-12-02 NOTE — Telephone Encounter (Signed)
 Contacted patient and patient verbalized understanding that she is  cleared for procedure without further testing per cardiology.

## 2023-12-03 ENCOUNTER — Telehealth: Payer: Self-pay | Admitting: Nurse Practitioner

## 2023-12-03 ENCOUNTER — Ambulatory Visit: Admitting: Internal Medicine

## 2023-12-03 NOTE — Telephone Encounter (Signed)
 Contacted patient and patient is aware of clearance. Patient was scheduled for procedure at office visit.

## 2023-12-03 NOTE — Telephone Encounter (Signed)
 DD, cardiac clearance per Lawana Pray cardiology NP received 11/29/2023.  Please contact the patient and schedule her for an EGD with Dr. General Kenner.  Refer to office visit 11/29/2023.

## 2023-12-04 ENCOUNTER — Encounter: Payer: Self-pay | Admitting: Internal Medicine

## 2023-12-06 ENCOUNTER — Ambulatory Visit
Admission: RE | Admit: 2023-12-06 | Discharge: 2023-12-06 | Disposition: A | Discharge status: Home / Self Care | Source: Ambulatory Visit | Attending: Nurse Practitioner | Admitting: Nurse Practitioner

## 2023-12-06 DIAGNOSIS — R0789 Other chest pain: Secondary | ICD-10-CM | POA: Insufficient documentation

## 2023-12-06 DIAGNOSIS — R160 Hepatomegaly, not elsewhere classified: Secondary | ICD-10-CM | POA: Diagnosis not present

## 2023-12-06 LAB — PTH, INTACT AND CALCIUM
Calcium: 9.6 mg/dL (ref 8.6–10.4)
PTH: 24 pg/mL (ref 16–77)

## 2023-12-07 ENCOUNTER — Other Ambulatory Visit: Payer: Self-pay | Admitting: Internal Medicine

## 2023-12-07 LAB — PTH-RELATED PEPTIDE: PTH-Related Protein (PTH-RP): 37 pg/mL — ABNORMAL HIGH (ref 11–20)

## 2023-12-08 ENCOUNTER — Other Ambulatory Visit: Payer: Self-pay

## 2023-12-08 DIAGNOSIS — K769 Liver disease, unspecified: Secondary | ICD-10-CM

## 2023-12-09 ENCOUNTER — Ambulatory Visit (INDEPENDENT_AMBULATORY_CARE_PROVIDER_SITE_OTHER): Admitting: Internal Medicine

## 2023-12-09 ENCOUNTER — Encounter: Payer: Self-pay | Admitting: Internal Medicine

## 2023-12-09 VITALS — BP 120/74 | HR 88 | Ht 62.5 in | Wt 139.4 lb

## 2023-12-09 DIAGNOSIS — R140 Abdominal distension (gaseous): Secondary | ICD-10-CM

## 2023-12-09 DIAGNOSIS — R7989 Other specified abnormal findings of blood chemistry: Secondary | ICD-10-CM

## 2023-12-09 MED ORDER — ROSUVASTATIN CALCIUM 20 MG PO TABS
20.0000 mg | ORAL_TABLET | Freq: Every day | ORAL | 3 refills | Status: AC
Start: 2023-12-09 — End: ?

## 2023-12-09 NOTE — Progress Notes (Signed)
 Subjective:  Patient ID: Michele Hess, female    DOB: 1956-04-19  Age: 68 y.o. MRN: [id number]  CC: The primary encounter diagnosis was Elevated PTHrP level. A diagnosis of Hypercalcemia was also pertinent to this visit.   HPI Michele Hess presents for  Chief Complaint  Patient presents with   Medical Management of Chronic Issues    Discuss lab results   Reisa was recently evaluated for hypercalcemia and was noted to have an elevated PTH RP.  She  has no known history of cancer  but during a workup by GI for  persistent GERD symptoms, she was noted to have a liver mass .  An MRI of the abdomen is scheduled. She has no history of night sweats , bloating or unintentional weight loss.    Outpatient Medications Prior to Visit  Medication Sig Dispense Refill   amLODipine  (NORVASC ) 2.5 MG tablet TAKE 1 TABLET BY MOUTH EVERY DAY 90 tablet 3   aspirin EC 81 MG tablet Take 81 mg by mouth. Swallow whole. Twice weekly     diclofenac  Sodium (VOLTAREN ) 1 % GEL Apply 4 g topically 3 (three) times daily as needed. 50 g 0   ezetimibe  (ZETIA ) 10 MG tablet TAKE 1 TABLET BY MOUTH EVERY DAY 90 tablet 3   omeprazole  (PRILOSEC) 20 MG capsule Take 1 capsule (20 mg total) by mouth daily. Take 30 minutes before breakfast. 30 capsule 1   UNABLE TO FIND Take 1 tablet by mouth daily. Nature Made Calcium  1200 mg and Vitamin D3 1000 mg     rosuvastatin  (CRESTOR ) 20 MG tablet Take 1 tablet (20 mg total) by mouth daily. 90 tablet 3   Lactobacillus (PROBIOTIC ACIDOPHILUS PO) Take 1 capsule by mouth daily. (Patient not taking: Reported on 12/09/2023)     Multiple Vitamins-Minerals (EMERGEN-C VITAMIN C) PACK Take 1 packet by mouth as needed. (Patient not taking: Reported on 12/09/2023)     promethazine  (PHENERGAN ) 12.5 MG tablet Take 1 tablet (12.5 mg total) by mouth every 6 (six) hours as needed for nausea or vomiting. (Patient not taking: Reported on 12/09/2023) 30 tablet 0   Facility-Administered Medications Prior to  Visit  Medication Dose Route Frequency Provider Last Rate Last Admin   0.9 %  sodium chloride  infusion  500 mL Intravenous Once Armbruster, Lendon Queen, MD        Review of Systems;  Patient denies headache, fevers, malaise, unintentional weight loss, skin rash, eye pain, sinus congestion and sinus pain, sore throat, dysphagia,  hemoptysis , cough, dyspnea, wheezing, chest pain, palpitations, orthopnea, edema, abdominal pain, nausea, melena, diarrhea, constipation, flank pain, dysuria, hematuria, urinary  Frequency, nocturia, numbness, tingling, seizures,  Focal weakness, Loss of consciousness,  Tremor, insomnia, depression, anxiety, and suicidal ideation.      Objective:  BP 120/74   Pulse 88   Ht 5' 2.5" (1.588 m)   Wt 139 lb 6.4 oz (63.2 kg)   SpO2 97%   BMI 25.09 kg/m   BP Readings from Last 3 Encounters:  12/09/23 120/74  11/29/23 (!) 174/90  10/21/23 130/80    Wt Readings from Last 3 Encounters:  12/09/23 139 lb 6.4 oz (63.2 kg)  11/29/23 139 lb 8 oz (63.3 kg)  10/21/23 141 lb (64 kg)    Physical Exam Vitals reviewed.  Constitutional:      General: She is not in acute distress.    Appearance: Normal appearance. She is normal weight. She is not ill-appearing, toxic-appearing or diaphoretic.  HENT:  Head: Normocephalic.  Eyes:     General: No scleral icterus.       Right eye: No discharge.        Left eye: No discharge.     Conjunctiva/sclera: Conjunctivae normal.  Cardiovascular:     Rate and Rhythm: Normal rate and regular rhythm.     Heart sounds: Normal heart sounds.  Pulmonary:     Effort: Pulmonary effort is normal. No respiratory distress.     Breath sounds: Normal breath sounds.  Musculoskeletal:        General: Normal range of motion.  Skin:    General: Skin is warm and dry.  Neurological:     General: No focal deficit present.     Mental Status: She is alert and oriented to person, place, and time. Mental status is at baseline.  Psychiatric:         Mood and Affect: Mood normal.        Behavior: Behavior normal.        Thought Content: Thought content normal.        Judgment: Judgment normal.    Lab Results  Component Value Date   HGBA1C 5.9 11/09/2022   HGBA1C 6.0 04/29/2022   HGBA1C 5.9 10/22/2021    Lab Results  Component Value Date   CREATININE 0.61 12/01/2023   CREATININE 0.64 08/30/2023   CREATININE 0.67 06/01/2023    Lab Results  Component Value Date   WBC 5.3 12/01/2023   HGB 13.6 12/01/2023   HCT 41.5 12/01/2023   PLT 319.0 12/01/2023   GLUCOSE 107 (H) 12/01/2023   CHOL 161 06/01/2023   TRIG 128 06/01/2023   HDL 72 06/01/2023   LDLDIRECT 78.0 11/09/2022   LDLCALC 68 06/01/2023   ALT 14 12/01/2023   AST 29 12/01/2023   NA 136 12/01/2023   K 4.3 12/01/2023   CL 99 12/01/2023   CREATININE 0.61 12/01/2023   BUN 13 12/01/2023   CO2 28 12/01/2023   TSH 1.46 12/01/2023   HGBA1C 5.9 11/09/2022   MICROALBUR <0.7 11/11/2022    US  Abdomen Limited RUQ (LIVER/GB) Result Date: 12/06/2023 CLINICAL DATA:  atypical chest pain: to rule out gallstones EXAM: ULTRASOUND ABDOMEN LIMITED RIGHT UPPER QUADRANT COMPARISON:  None Available. FINDINGS: Gallbladder: No gallstones or wall thickening visualized. No sonographic Murphy sign noted by sonographer. Common bile duct: Diameter: Visualized portion measures 5 mm, within normal limits. Liver: Hyperechoic mass RIGHT liver along the dome measures 10 x 10 x 9 mm. Within normal limits in parenchymal echogenicity. Portal vein is patent on color Doppler imaging with normal direction of blood flow towards the liver. Other: None. IMPRESSION: 1. No sonographic etiology for RIGHT upper quadrant pain identified. 2. There is a 10 mm hyperechoic mass in the RIGHT liver along the dome. This is favored to reflect a hemangioma in the absence of a history of malignancy. Consider further characterization dedicated abdominal MRI with and without contrast versus follow-up ultrasound in 6 months to  assess for stability. Electronically Signed   By: Clancy Crimes M.D.   On: 12/06/2023 11:20    Assessment & Plan:  .Elevated PTHrP level Assessment & Plan: Workup for malignancy to include MRI abdoment to evaluate a liver lesion.  And transvaginal ultrasound to rule out OSA   Orders: -     CA 125 -     US  PELVIC COMPLETE WITH TRANSVAGINAL; Future  Hypercalcemia Assessment & Plan: Etiology unclear .  PTH rp was elevated. PTH was normal and repeat  calcium   is normal.    Orders: -     US  PELVIC COMPLETE WITH TRANSVAGINAL; Future  Other orders -     Rosuvastatin  Calcium ; Take 1 tablet (20 mg total) by mouth daily.  Dispense: 90 tablet; Refill: 3     I spent 34 minutes on the day of this face to face encounter reviewing patient's  most recent visit with cardiology,  nephrology,  and neurology,  prior relevant surgical and non surgical procedures, recent  labs and imaging studies, counseling on weight management,  reviewing the assessment and plan with patient, and post visit ordering and reviewing of  diagnostics and therapeutics with patient  .   Follow-up: No follow-ups on file.   Thersia Flax, MD

## 2023-12-09 NOTE — Patient Instructions (Signed)
 TRY LISTENING TO TIBETAN BOWLS TO RELAX MIND. IMPROVE SLEEP  CA 125 TODAY AND TRANSVAGINAL  ULTRASOUND ORDERED   HEMATOLOGY REFERRAL MADE FOR ELEVATED PTH RP WORKUP

## 2023-12-10 LAB — CA 125: CA 125: 4 U/mL (ref <35)

## 2023-12-11 DIAGNOSIS — R7989 Other specified abnormal findings of blood chemistry: Secondary | ICD-10-CM | POA: Insufficient documentation

## 2023-12-11 NOTE — Assessment & Plan Note (Signed)
 Etiology unclear .  PTH rp was elevated. PTH was normal and repeat calcium   is normal.

## 2023-12-11 NOTE — Assessment & Plan Note (Signed)
 Workup for malignancy to include MRI abdoment to evaluate a liver lesion.  And transvaginal ultrasound to rule out OSA

## 2023-12-13 ENCOUNTER — Ambulatory Visit
Admission: RE | Admit: 2023-12-13 | Discharge: 2023-12-13 | Disposition: A | Discharge status: Home / Self Care | Source: Ambulatory Visit | Attending: Internal Medicine | Admitting: Internal Medicine

## 2023-12-13 DIAGNOSIS — R7989 Other specified abnormal findings of blood chemistry: Secondary | ICD-10-CM | POA: Insufficient documentation

## 2023-12-13 DIAGNOSIS — Z90710 Acquired absence of both cervix and uterus: Secondary | ICD-10-CM | POA: Diagnosis not present

## 2023-12-15 ENCOUNTER — Ambulatory Visit (HOSPITAL_COMMUNITY)
Admission: RE | Admit: 2023-12-15 | Discharge: 2023-12-15 | Disposition: A | Discharge status: Home / Self Care | Source: Ambulatory Visit | Attending: Nurse Practitioner | Admitting: Nurse Practitioner

## 2023-12-15 DIAGNOSIS — D1803 Hemangioma of intra-abdominal structures: Secondary | ICD-10-CM | POA: Diagnosis not present

## 2023-12-15 DIAGNOSIS — K769 Liver disease, unspecified: Secondary | ICD-10-CM | POA: Diagnosis not present

## 2023-12-15 MED ORDER — GADOBUTROL 1 MMOL/ML IV SOLN
6.0000 mL | Freq: Once | INTRAVENOUS | Status: AC | PRN
Start: 2023-12-15 — End: 2023-12-15
  Administered 2023-12-15: 6 mL via INTRAVENOUS

## 2023-12-16 ENCOUNTER — Ambulatory Visit: Payer: Self-pay | Admitting: Nurse Practitioner

## 2023-12-19 ENCOUNTER — Encounter: Payer: Self-pay | Admitting: Internal Medicine

## 2023-12-19 ENCOUNTER — Ambulatory Visit: Payer: Self-pay | Admitting: Internal Medicine

## 2023-12-23 ENCOUNTER — Encounter: Payer: Self-pay | Admitting: Internal Medicine

## 2023-12-23 DIAGNOSIS — R140 Abdominal distension (gaseous): Secondary | ICD-10-CM | POA: Insufficient documentation

## 2024-01-10 ENCOUNTER — Encounter: Payer: Self-pay | Admitting: Gastroenterology

## 2024-01-17 ENCOUNTER — Ambulatory Visit (AMBULATORY_SURGERY_CENTER): Admitting: Gastroenterology

## 2024-01-17 ENCOUNTER — Telehealth: Payer: Self-pay | Admitting: Gastroenterology

## 2024-01-17 ENCOUNTER — Encounter: Payer: Self-pay | Admitting: Gastroenterology

## 2024-01-17 VITALS — BP 128/76 | HR 74 | Temp 99.0°F | Resp 16 | Ht 62.5 in | Wt 139.0 lb

## 2024-01-17 DIAGNOSIS — R0789 Other chest pain: Secondary | ICD-10-CM

## 2024-01-17 DIAGNOSIS — K319 Disease of stomach and duodenum, unspecified: Secondary | ICD-10-CM | POA: Diagnosis not present

## 2024-01-17 DIAGNOSIS — K31819 Angiodysplasia of stomach and duodenum without bleeding: Secondary | ICD-10-CM

## 2024-01-17 DIAGNOSIS — K449 Diaphragmatic hernia without obstruction or gangrene: Secondary | ICD-10-CM | POA: Diagnosis not present

## 2024-01-17 DIAGNOSIS — K219 Gastro-esophageal reflux disease without esophagitis: Secondary | ICD-10-CM

## 2024-01-17 DIAGNOSIS — F419 Anxiety disorder, unspecified: Secondary | ICD-10-CM | POA: Diagnosis not present

## 2024-01-17 DIAGNOSIS — I10 Essential (primary) hypertension: Secondary | ICD-10-CM | POA: Diagnosis not present

## 2024-01-17 MED ORDER — OMEPRAZOLE 20 MG PO CPDR: 20.0000 mg | DELAYED_RELEASE_CAPSULE | Freq: Two times a day (BID) | ORAL | 3 refills | Status: DC

## 2024-01-17 MED ORDER — SODIUM CHLORIDE 0.9 % IV SOLN: 500.0000 mL | Freq: Once | INTRAVENOUS | Status: DC

## 2024-01-17 MED ORDER — SUCRALFATE 1 GM/10ML PO SUSP: 1.0000 g | Freq: Every evening | ORAL | 1 refills | Status: DC

## 2024-01-17 NOTE — Progress Notes (Signed)
 Pt awake, alert and oriented. VSS. Airway intact. SBAR complete to RN. All questions answered.

## 2024-01-17 NOTE — Patient Instructions (Signed)

## 2024-01-17 NOTE — Telephone Encounter (Signed)
 Inbound call from patient stating she believes she may have taken 2 ibuprofen's last Monday 6/9. Patient states it may have been before then but patient would like a call back to confirm that it is okay to proceed with EGD today at 2:30 pm. Please advise, thank you

## 2024-01-17 NOTE — Telephone Encounter (Signed)
 Got it, agree. Thanks

## 2024-01-17 NOTE — Progress Notes (Signed)
 [facility] History and Physical   Primary Care Physician:  Thersia Flax, MD   Reason for Procedure:   Suspected GERD - atypical chest pain  / epigastric pain  Plan:    EGD     HPI: Michele Hess is a 68 y.o. female  here for EGD To evaluate suspected symptoms related to GERD / upper tract. See office visit from April for details. Now s/p negative RUQ US  and liver MRI. Started on omeprazole  20mg  / day. She has episodic chest burning and globus, can often happen at night. She has been on PPI for 4-6 weeks - does think some mild improvement but no resolution. Here for EGD to evaluate.  Otherwise feels well without any cardiopulmonary symptoms.   I have discussed risks / benefits of anesthesia and endoscopic procedure with Mitzie Anda and they wish to proceed with the exams as outlined today.    Past Medical History:  Diagnosis Date   Anxiety    white coat syndrome   GERD (gastroesophageal reflux disease)    History of ETT    myoview  7/09: 9 min 31 sec, EF 71%, nov evidence for ischemia. ETT 99/10) 1030, excellent exercise tolerance, no abnormal rhythms, no evidence for ischemia   HLD (hyperlipidemia)    Hypertension    Hypertension 2023   Menopausal and postmenopausal disorder    Osteopenia    Seasonal allergies    Seizure (HCC) 01/2016   Dr. Tilda Fogo- per pt- MD told her is was not a seizure   Syncope and collapse 01/27/2016    Past Surgical History:  Procedure Laterality Date   ANTERIOR CRUCIATE LIGAMENT REPAIR Left 2011   COLONOSCOPY  2019   SA-MAC-prep good-TA's-recall 3 yrs   echo (other)  04/2009   EF 60%, lv normal, RV normal, mild MR, trivial pericardial effusion    LUMBAR LAMINECTOMY  10/2020   PARTIAL HYSTERECTOMY     PELVIC LAPAROSCOPY  1987   POLYPECTOMY  2019   TA's   TONSILLECTOMY  1961   TOTAL ABDOMINAL HYSTERECTOMY  1993   Grier Leber   WISDOM TOOTH EXTRACTION      Prior to Admission medications   Medication Sig Start Date End  Date Taking? Authorizing Provider  amLODipine  (NORVASC ) 2.5 MG tablet TAKE 1 TABLET BY MOUTH EVERY DAY 11/05/23  Yes Thersia Flax, MD  aspirin EC 81 MG tablet Take 81 mg by mouth. Swallow whole. Twice weekly   Yes [provider]  diclofenac  Sodium (VOLTAREN ) 1 % GEL Apply 4 g topically 3 (three) times daily as needed. 10/08/22  Yes Kaur, Charanpreet, NP  ezetimibe  (ZETIA ) 10 MG tablet TAKE 1 TABLET BY MOUTH EVERY DAY 11/05/23  Yes Thersia Flax, MD  omeprazole  (PRILOSEC) 20 MG capsule Take 1 capsule (20 mg total) by mouth daily. Take 30 minutes before breakfast. 11/29/23  Yes Kennedy-Smith, Colleen M, NP  rosuvastatin  (CRESTOR ) 20 MG tablet Take 1 tablet (20 mg total) by mouth daily. 12/09/23  Yes Tullo, Teresa L, MD  Lactobacillus (PROBIOTIC ACIDOPHILUS PO) Take 1 capsule by mouth daily. Patient not taking: Reported on 11/29/2023    [provider]  UNABLE TO FIND Take 1 tablet by mouth daily. Nature Made Calcium  1200 mg and Vitamin D3 1000 mg    [provider]    Current Outpatient Medications  Medication Sig Dispense Refill   amLODipine  (NORVASC ) 2.5 MG tablet TAKE 1 TABLET BY MOUTH EVERY DAY 90 tablet 3   aspirin EC 81  MG tablet Take 81 mg by mouth. Swallow whole. Twice weekly     diclofenac  Sodium (VOLTAREN ) 1 % GEL Apply 4 g topically 3 (three) times daily as needed. 50 g 0   ezetimibe  (ZETIA ) 10 MG tablet TAKE 1 TABLET BY MOUTH EVERY DAY 90 tablet 3   omeprazole  (PRILOSEC) 20 MG capsule Take 1 capsule (20 mg total) by mouth daily. Take 30 minutes before breakfast. 30 capsule 1   rosuvastatin  (CRESTOR ) 20 MG tablet Take 1 tablet (20 mg total) by mouth daily. 90 tablet 3   Lactobacillus (PROBIOTIC ACIDOPHILUS PO) Take 1 capsule by mouth daily. (Patient not taking: Reported on 11/29/2023)     UNABLE TO FIND Take 1 tablet by mouth daily. Nature Made Calcium  1200 mg and Vitamin D3 1000 mg     Current Facility-Administered Medications  Medication Dose Route Frequency  Provider Last Rate Last Admin   0.9 %  sodium chloride  infusion  500 mL Intravenous Once Jarvin Ogren, Lendon Queen, MD       0.9 %  sodium chloride  infusion  500 mL Intravenous Once Sasuke Yaffe, Lendon Queen, MD        Allergies as of 01/17/2024 - Review Complete 01/17/2024  Allergen Reaction Noted   Neomycin Rash 04/01/2009    Family History  Problem Relation Age of Onset   Heart attack Mother 74   Diabetes Mother    Heart attack Father 40   Cancer Brother 54       metastatic lung CA   Lung cancer Brother    Heart failure Brother    Heart attack Brother 49       4 VESSEL BYPASS   Diabetes Brother    Familial polyposis Brother    Colon cancer Neg Hx    Stomach cancer Neg Hx    Esophageal cancer Neg Hx    Rectal cancer Neg Hx    Colon polyps Neg Hx     Social History   Socioeconomic History   Marital status: Married    Spouse name: Jonelle Neri   Number of children: 2   Years of education: 12   Highest education level: Not on file  Occupational History   Occupation: Retired  Tobacco Use   Smoking status: Former    Current packs/day: 0.00    Average packs/day: 1 pack/day for 17.0 years (17.0 ttl pk-yrs)    Types: Cigarettes    Start date: 05/14/1970    Quit date: 05/15/1987    Years since quitting: 36.7   Smokeless tobacco: Never   Tobacco comments:    quit 27 years ago   Vaping Use   Vaping status: Never Used  Substance and Sexual Activity   Alcohol use: Not Currently    Alcohol/week: 5.0 standard drinks of alcohol    Types: 5 Standard drinks or equivalent per week    Comment: socially   Drug use: No   Sexual activity: Yes    Birth control/protection: Post-menopausal  Other Topics Concern   Not on file  Social History Narrative   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    Lives in [place] w/ her husband   Right-handed   Drinks 1/2 caf coffee 2-3 times per day   Social Drivers of Health   Financial Resource Strain: Low Risk  (03/03/2023)   Overall Financial  Resource Strain (CARDIA)    Difficulty of Paying Living Expenses: Not hard at all  Food Insecurity: No Food Insecurity (03/03/2023)   Hunger Vital Sign  Worried About Programme researcher, broadcasting/film/video in the Last Year: Never true    Ran Out of Food in the Last Year: Never true  Transportation Needs: No Transportation Needs (03/03/2023)   PRAPARE - Administrator, Civil Service (Medical): No    Lack of Transportation (Non-Medical): No  Physical Activity: Sufficiently Active (03/03/2023)   Exercise Vital Sign    Days of Exercise per Week: 3 days    Minutes of Exercise per Session: 50 min  Stress: No Stress Concern Present (03/03/2023)   [employer] of Occupational Health - Occupational Stress Questionnaire    Feeling of Stress : Not at all  Social Connections: Moderately Integrated (03/03/2023)   Social Connection and Isolation Panel    Frequency of Communication with Friends and Family: More than three times a week    Frequency of Social Gatherings with Friends and Family: More than three times a week    Attends Religious Services: Never    Database administrator or Organizations: Yes    Attends Engineer, structural: More than 4 times per year    Marital Status: Married  Catering manager Violence: Not At Risk (03/03/2023)   Humiliation, Afraid, Rape, and Kick questionnaire    Fear of Current or Ex-Partner: No    Emotionally Abused: No    Physically Abused: No    Sexually Abused: No    Review of Systems: All other review of systems negative except as mentioned in the HPI.  Physical Exam: Vital signs BP (!) 175/100   Pulse (!) 105   Temp 99 F (37.2 C) (Temporal)   Ht 5' 2.5 (1.588 m)   Wt 139 lb (63 kg)   SpO2 99%   BMI 25.02 kg/m   General:   Alert,  Well-developed, pleasant and cooperative in NAD Lungs:  Clear throughout to auscultation.   Heart:  Regular rate and rhythm Abdomen:  Soft, nontender and nondistended.   Neuro/Psych:  Alert and cooperative.  Normal mood and affect. A and O x 3  Christi Coward, MD [facility]

## 2024-01-17 NOTE — Telephone Encounter (Signed)
 Returned pt's call. Left message that it was okay to proceed with EGD today despite having taken Ibuprofen last week. MD, Arlie Lain.

## 2024-01-17 NOTE — Progress Notes (Signed)
 Called to room to assist during endoscopic procedure.  Patient ID and intended procedure confirmed with present staff. Received instructions for my participation in the procedure from the performing physician.

## 2024-01-17 NOTE — Op Note (Signed)
 [facility] Patient Name: Michele Hess Procedure Date: 01/17/2024 3:20 PM MRN: [id number] Endoscopist: Landon Pinion P. General Kenner , MD, [id number] Age: 68 Referring MD:  Date of Birth: 01-Oct-1955 Gender: Female Account #: [id number] Procedure:                Upper GI endoscopy Indications:              Unexplained chest / epigastric pain - often occurs                            at night, suspected GERD, globus - on omeprazole                             with some mild benefit thus far. Imaging negative                            for gallstones. Medicines:                Monitored Anesthesia Care Procedure:                Pre-Anesthesia Assessment:                           - Prior to the procedure, a History and Physical                            was performed, and patient medications and                            allergies were reviewed. The patient's tolerance of                            previous anesthesia was also reviewed. The risks                            and benefits of the procedure and the sedation                            options and risks were discussed with the patient.                            All questions were answered, and informed consent                            was obtained. Prior Anticoagulants: The patient has                            taken no anticoagulant or antiplatelet agents. ASA                            Grade Assessment: II - A patient with mild systemic                            disease. After reviewing the risks and benefits,  the patient was deemed in satisfactory condition to                            undergo the procedure.                           After obtaining informed consent, the endoscope was                            passed under direct vision. Throughout the                            procedure, the patient's blood pressure, pulse, and                            oxygen saturations were monitored  continuously. The                            GIF [id number] #[id number] was introduced through the                            mouth, and advanced to the second part of duodenum.                            The upper GI endoscopy was accomplished without                            difficulty. The patient tolerated the procedure                            well. Scope In: Scope Out: Findings:                 Esophagogastric landmarks were identified: the                            Z-line was found at 36 cm, the gastroesophageal                            junction was found at 36 cm and the upper extent of                            the gastric folds was found at 37 cm from the                            incisors.                           A 1 cm hiatal hernia was present.                           The exam of the esophagus was otherwise normal. No                            erosive changes.  The entire examined stomach was normal. Biopsies                            were taken with a cold forceps for Helicobacter                            pylori testing.                           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                           The exam of the duodenum was otherwise normal. Complications:            No immediate complications. Estimated blood loss:                            Minimal. Estimated Blood Loss:     Estimated blood loss was minimal. Impression:               - Esophagogastric landmarks identified.                           - 1 cm hiatal hernia.                           - Normal esophagus otherwise.                           - Normal stomach. Biopsied.                           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                           - Normal duodenum otherwise.                           Symptoms could be related to nonerosive reflux.                             Recommend trial of escalation of PPI to BID dosing                            of omeprazole  and trial of liquid carafate PRN to                            see if that helps abort episodes. Recommendation:           - Patient has a contact number available for                            emergencies. The signs and symptoms of potential  delayed complications were discussed with the                            patient. Return to normal activities tomorrow.                            Written discharge instructions were provided to the                            patient.                           - Resume previous diet.                           - Continue present medications.                           - Increase omeprazole  to twice daily dosing for a                            few weeks trial                           - Add liquid carafate 10cc qHS and then PRN to help                            abort episodes as they arise                           - Await pathology results. Landon Pinion P. Michele Evers, MD 01/17/2024 3:51:45 PM This report has been signed electronically.

## 2024-01-18 ENCOUNTER — Telehealth: Payer: Self-pay

## 2024-01-18 NOTE — Telephone Encounter (Signed)
  Follow up Call-     01/17/2024    2:28 PM  Call back number  Post procedure Call Back phone  # [phone]  Permission to leave phone message Yes     Patient questions:  Do you have a fever, pain , or abdominal swelling? No. Pain Score  0 *  Have you tolerated food without any problems? Yes.    Have you been able to return to your normal activities? Yes.    Do you have any questions about your discharge instructions: Diet   No. Medications  No. Follow up visit  No.  Do you have questions or concerns about your Care? No.  Actions: * If pain score is 4 or above: No action needed, pain <4.

## 2024-01-19 ENCOUNTER — Encounter: Payer: Self-pay | Admitting: Gastroenterology

## 2024-01-20 LAB — SURGICAL PATHOLOGY

## 2024-01-23 ENCOUNTER — Ambulatory Visit: Payer: Self-pay | Admitting: Gastroenterology

## 2024-01-24 ENCOUNTER — Other Ambulatory Visit: Payer: Self-pay | Admitting: Gastroenterology

## 2024-01-24 DIAGNOSIS — K219 Gastro-esophageal reflux disease without esophagitis: Secondary | ICD-10-CM

## 2024-02-07 ENCOUNTER — Encounter: Payer: Self-pay | Admitting: Internal Medicine

## 2024-02-09 DIAGNOSIS — D225 Melanocytic nevi of trunk: Secondary | ICD-10-CM | POA: Diagnosis not present

## 2024-02-09 DIAGNOSIS — D2271 Melanocytic nevi of right lower limb, including hip: Secondary | ICD-10-CM | POA: Diagnosis not present

## 2024-02-09 DIAGNOSIS — D2272 Melanocytic nevi of left lower limb, including hip: Secondary | ICD-10-CM | POA: Diagnosis not present

## 2024-02-09 DIAGNOSIS — L821 Other seborrheic keratosis: Secondary | ICD-10-CM | POA: Diagnosis not present

## 2024-02-09 DIAGNOSIS — D2261 Melanocytic nevi of right upper limb, including shoulder: Secondary | ICD-10-CM | POA: Diagnosis not present

## 2024-02-09 DIAGNOSIS — D2262 Melanocytic nevi of left upper limb, including shoulder: Secondary | ICD-10-CM | POA: Diagnosis not present

## 2024-02-09 DIAGNOSIS — L570 Actinic keratosis: Secondary | ICD-10-CM | POA: Diagnosis not present

## 2024-03-08 ENCOUNTER — Ambulatory Visit: Admitting: *Deleted

## 2024-03-08 VITALS — Ht 62.5 in | Wt 139.0 lb

## 2024-03-08 DIAGNOSIS — Z0000 Encounter for general adult medical examination without abnormal findings: Secondary | ICD-10-CM | POA: Diagnosis not present

## 2024-03-08 NOTE — Progress Notes (Signed)
 Subjective:   Michele Hess is a 68 y.o. who presents for a Medicare Wellness preventive visit.  As a reminder, Annual Wellness Visits don't include a physical exam, and some assessments may be limited, especially if this visit is performed virtually. We may recommend an in-person follow-up visit with your provider if needed.  Visit Complete: Virtual I connected with  Michele Hess on 03/08/24 by a audio enabled telemedicine application and verified that I am speaking with the correct person using two identifiers.  Patient Location: Home  Provider Location: Home Office  I discussed the limitations of evaluation and management by telemedicine. The patient expressed understanding and agreed to proceed.  Vital Signs: Because this visit was a virtual/telehealth visit, some criteria may be missing or patient reported. Any vitals not documented were not able to be obtained and vitals that have been documented are patient reported.  VideoDeclined- This patient declined Librarian, academic. Therefore the visit was completed with audio only.  Persons Participating in Visit: Patient.  AWV Questionnaire: No: Patient Medicare AWV questionnaire was not completed prior to this visit.  Cardiac Risk Factors include: advanced age (>80men, >51 women);dyslipidemia;hypertension     Objective:    Today's Vitals   03/08/24 1051  Weight: 139 lb (63 kg)  Height: 5' 2.5 (1.588 m)   Body mass index is 25.02 kg/m.     03/08/2024   11:05 AM 03/03/2023    8:34 AM  Advanced Directives  Does Patient Have a Medical Advance Directive? Yes Yes  Type of Estate agent of [place];Living will Healthcare Power of [place];Living will  Copy of Healthcare Power of Attorney in Chart? No - copy requested No - copy requested    Current Medications (verified) Outpatient Encounter Medications as of 03/08/2024  Medication Sig   amLODipine  (NORVASC ) 2.5 MG tablet  TAKE 1 TABLET BY MOUTH EVERY DAY   aspirin EC 81 MG tablet Take 81 mg by mouth. Swallow whole. Twice weekly   diclofenac  Sodium (VOLTAREN ) 1 % GEL Apply 4 g topically 3 (three) times daily as needed.   ezetimibe  (ZETIA ) 10 MG tablet TAKE 1 TABLET BY MOUTH EVERY DAY   omeprazole  (PRILOSEC) 20 MG capsule Take 1 capsule (20 mg total) by mouth daily. Take 30 minutes before breakfast. (Patient taking differently: Take 20 mg by mouth 2 (two) times daily before a meal. Take 30 minutes before breakfast.)   omeprazole  (PRILOSEC) 20 MG capsule Take 1 capsule (20 mg total) by mouth 2 (two) times daily before a meal.   rosuvastatin  (CRESTOR ) 20 MG tablet Take 1 tablet (20 mg total) by mouth daily.   UNABLE TO FIND Take 1 tablet by mouth daily. Nature Made Calcium  1200 mg and Vitamin D3 1000 mg   Lactobacillus (PROBIOTIC ACIDOPHILUS PO) Take 1 capsule by mouth daily. (Patient not taking: Reported on 03/08/2024)   sucralfate  (CARAFATE ) 1 GM/10ML suspension TAKE 10 MLS (1 G TOTAL) BY MOUTH NIGHTLY. TAKE NIGHTLY, AND AS NEEDED (Patient not taking: Reported on 03/08/2024)   Facility-Administered Encounter Medications as of 03/08/2024  Medication   0.9 %  sodium chloride  infusion    Allergies (verified) Neomycin   History: Past Medical History:  Diagnosis Date   Anxiety    white coat syndrome   GERD (gastroesophageal reflux disease)    History of ETT    myoview  7/09: 9 min 31 sec, EF 71%, nov evidence for ischemia. ETT 99/10) 1030, excellent exercise tolerance, no abnormal rhythms, no evidence  for ischemia   HLD (hyperlipidemia)    Hypertension    Hypertension 2023   Menopausal and postmenopausal disorder    Osteopenia    Seasonal allergies    Seizure (HCC) 01/2016   Dr. Jenel- per pt- MD told her is was not a seizure   Syncope and collapse 01/27/2016   Past Surgical History:  Procedure Laterality Date   ANTERIOR CRUCIATE LIGAMENT REPAIR Left 2011   COLONOSCOPY  2019   SA-MAC-prep  good-TA's-recall 3 yrs   echo (other)  04/2009   EF 60%, lv normal, RV normal, mild MR, trivial pericardial effusion    LUMBAR LAMINECTOMY  10/2020   PARTIAL HYSTERECTOMY     PELVIC LAPAROSCOPY  1987   POLYPECTOMY  2019   TA's   TONSILLECTOMY  1961   TOTAL ABDOMINAL HYSTERECTOMY  1993   Fleeta Milks   WISDOM TOOTH EXTRACTION     Family History  Problem Relation Age of Onset   Heart attack Mother 48   Diabetes Mother    Heart attack Father 71   Cancer Brother 63       metastatic lung CA   Lung cancer Brother    Heart failure Brother    Heart attack Brother 70       4 VESSEL BYPASS   Diabetes Brother    Familial polyposis Brother    Colon cancer Neg Hx    Stomach cancer Neg Hx    Esophageal cancer Neg Hx    Rectal cancer Neg Hx    Colon polyps Neg Hx    Social History   Socioeconomic History   Marital status: Married    Spouse name: Jodie   Number of children: 2   Years of education: 12   Highest education level: Not on file  Occupational History   Occupation: Retired  Tobacco Use   Smoking status: Former    Current packs/day: 0.00    Average packs/day: 1 pack/day for 17.0 years (17.0 ttl pk-yrs)    Types: Cigarettes    Start date: 05/14/1970    Quit date: 05/15/1987    Years since quitting: 36.8   Smokeless tobacco: Never   Tobacco comments:    quit 27 years ago   Vaping Use   Vaping status: Never Used  Substance and Sexual Activity   Alcohol use: Not Currently    Alcohol/week: 5.0 standard drinks of alcohol    Types: 5 Standard drinks or equivalent per week    Comment: socially   Drug use: No   Sexual activity: Yes    Birth control/protection: Post-menopausal  Other Topics Concern   Not on file  Social History Narrative   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    Lives in [place] w/ her husband   Right-handed   Drinks 1/2 caf coffee 2-3 times per day   Social Drivers of Health   Financial Resource Strain: Low Risk  (03/08/2024)   Overall  Financial Resource Strain (CARDIA)    Difficulty of Paying Living Expenses: Not hard at all  Food Insecurity: No Food Insecurity (03/08/2024)   Hunger Vital Sign    Worried About Running Out of Food in the Last Year: Never true    Ran Out of Food in the Last Year: Never true  Transportation Needs: No Transportation Needs (03/08/2024)   PRAPARE - Administrator, Civil Service (Medical): No    Lack of Transportation (Non-Medical): No  Physical Activity: Sufficiently Active (03/08/2024)   Exercise Vital  Sign    Days of Exercise per Week: 4 days    Minutes of Exercise per Session: 150+ min  Stress: No Stress Concern Present (03/08/2024)   [employer] of Occupational Health - Occupational Stress Questionnaire    Feeling of Stress: Not at all  Social Connections: Moderately Integrated (03/08/2024)   Social Connection and Isolation Panel    Frequency of Communication with Friends and Family: More than three times a week    Frequency of Social Gatherings with Friends and Family: More than three times a week    Attends Religious Services: Never    Database administrator or Organizations: Yes    Attends Engineer, structural: More than 4 times per year    Marital Status: Married    Tobacco Counseling Counseling given: Not Answered Tobacco comments: quit 27 years ago     Clinical Intake:  Pre-visit preparation completed: Yes  Pain : No/denies pain     BMI - recorded: 25.02 Nutritional Status: BMI 25 -29 Overweight Nutritional Risks: None Diabetes: No  Lab Results  Component Value Date   HGBA1C 5.9 11/09/2022   HGBA1C 6.0 04/29/2022   HGBA1C 5.9 10/22/2021     How often do you need to have someone help you when you read instructions, pamphlets, or other written materials from your doctor or pharmacy?: 1 - Never  Interpreter Needed?: No  Information entered by :: R. Camrynn Mcclintic LPN   Activities of Daily Living     03/08/2024   10:52 AM  In your present state  of health, do you have any difficulty performing the following activities:  Hearing? 0  Vision? 0  Difficulty concentrating or making decisions? 0  Walking or climbing stairs? 0  Dressing or bathing? 0  Doing errands, shopping? 0  Preparing Food and eating ? N  Using the Toilet? N  In the past six months, have you accidently leaked urine? N  Do you have problems with loss of bowel control? N  Managing your Medications? N  Managing your Finances? N  Housekeeping or managing your Housekeeping? N    Patient Care Team: Marylynn Verneita CROME, MD as PCP - General (Internal Medicine) Perla Evalene PARAS, MD as PCP - Cardiology (Cardiology) Perla Evalene PARAS, MD as Consulting Physician (Cardiology) Dasher, Alm LABOR, MD (Dermatology) Armbruster, Elspeth SQUIBB, MD as Consulting Physician (Gastroenterology)  I have updated your Care Teams any recent Medical Services you may have received from other providers in the past year.     Assessment:   This is a routine wellness examination for Michele Hess.  Hearing/Vision screen Hearing Screening - Comments:: No issues Vision Screening - Comments:: glasses   Goals Addressed             This Visit's Progress    Patient Stated       Be more consistent with exercise        Depression Screen     03/08/2024   11:00 AM 12/09/2023   11:58 AM 03/03/2023    8:29 AM 02/17/2023    9:38 AM 10/08/2022    4:09 PM 05/12/2022   10:18 AM 02/02/2022    8:58 AM  PHQ 2/9 Scores  PHQ - 2 Score 0 0 0 0 0 0 0  PHQ- 9 Score 0  0  0      Fall Risk     03/08/2024   10:53 AM 12/09/2023   11:58 AM 03/03/2023    8:56 AM 03/03/2023    8:53  AM 03/03/2023    8:24 AM  Fall Risk   Falls in the past year? 0 0 1 1 0  Number falls in past yr: 0 0 0 0 0  Injury with Fall? 0 0 0 0 0  Risk for fall due to : No Fall Risks No Fall Risks History of fall(s);Impaired balance/gait History of fall(s);Impaired balance/gait No Fall Risks  Follow up Falls evaluation completed;Falls prevention  discussed Falls evaluation completed Falls evaluation completed;Education provided;Falls prevention discussed Falls evaluation completed;Falls prevention discussed;Education provided Falls prevention discussed;Falls evaluation completed    MEDICARE RISK AT HOME:  Medicare Risk at Home Any stairs in or around the home?: Yes If so, are there any without handrails?: No Home free of loose throw rugs in walkways, pet beds, electrical cords, etc?: Yes Adequate lighting in your home to reduce risk of falls?: Yes Life alert?: No Use of a cane, walker or w/c?: No Grab bars in the bathroom?: Yes Shower chair or bench in shower?: Yes Elevated toilet seat or a handicapped toilet?: No  TIMED UP AND GO:  Was the test performed?  No  Cognitive Function: 6CIT completed    02/02/2022    9:09 AM  MMSE - Mini Mental State Exam  Orientation to time 5  Orientation to Place 5  Registration 3  Attention/ Calculation 5  Recall 3  Language- name 2 objects 2  Language- repeat 1  Language- follow 3 step command 3  Language- read & follow direction 1  Write a sentence 1  Copy design 1  Total score 30        03/08/2024   11:05 AM 03/03/2023    8:34 AM 02/02/2022    8:59 AM  6CIT Screen  What Year? 0 points 0 points 0 points  What month? 0 points 0 points 0 points  What time? 0 points 0 points 0 points  Count back from 20 0 points 0 points 0 points  Months in reverse 0 points 0 points 0 points  Repeat phrase 0 points 0 points 0 points  Total Score 0 points 0 points 0 points    Immunizations Immunization History  Administered Date(s) Administered   Fluad Quad(high Dose 65+) 05/12/2022   Hepatitis A, Adult 10/17/2014, 04/30/2015   Hepatitis B 04/19/1997, 05/17/1997, 10/18/1997   Influenza Split 05/05/2011, 05/03/2013, 05/30/2014   Influenza,inj,Quad PF,6+ Mos 06/12/2015, 05/09/2018, 04/05/2019, 04/19/2020, 04/15/2021   Influenza-Unspecified 04/22/2016, 05/05/2017, 04/07/2023   MMR 09/11/2015,  10/10/2015   Moderna Covid-19 Seasonal Vaccine 6 months thru 67years of age 34/16/2023   [facility] Covid-19 Vaccine Bivalent Booster 33yrs & up 06/16/2021   Moderna Sars-Covid-2 Vaccination 10/18/2019, 11/15/2019, 06/03/2020, 03/27/2021   PNEUMOCOCCAL CONJUGATE-20 02/02/2022   Pfizer(Comirnaty)Fall Seasonal Vaccine 12 years and older 04/04/2023   Td 10/07/2018   Tdap 09/04/2008   Zoster Recombinant(Shingrix) 07/10/2019, 10/02/2019    Screening Tests Health Maintenance  Topic Date Due   COVID-19 Vaccine (7 - Moderna risk 2024-25 season) 10/02/2023   Medicare Annual Wellness (AWV)  03/02/2024   INFLUENZA VACCINE  03/03/2024   MAMMOGRAM  08/09/2024   Colonoscopy  03/24/2026   DTaP/Tdap/Td (3 - Td or Tdap) 10/06/2028   Pneumococcal Vaccine: 50+ Years  Completed   Hepatitis B Vaccines  Completed   DEXA SCAN  Completed   Hepatitis C Screening  Completed   Zoster Vaccines- Shingrix  Completed   HPV VACCINES  Aged Out   Meningococcal B Vaccine  Aged Out    Health Maintenance  Health Maintenance Due  Topic Date Due   COVID-19 Vaccine (7 - Moderna risk 2024-25 season) 10/02/2023   Medicare Annual Wellness (AWV)  03/02/2024   INFLUENZA VACCINE  03/03/2024   Health Maintenance Items Addressed: Discussed the need to update flu and covid vaccines annually  Additional Screening:  Vision Screening: Recommended annual ophthalmology exams for early detection of glaucoma and other disorders of the eye.Up to date [facility] Would you like a referral to an eye doctor? No    Dental Screening: Recommended annual dental exams for proper oral hygiene  Community Resource Referral / Chronic Care Management: CRR required this visit?  No   CCM required this visit?  No   Plan:    I have personally reviewed and noted the following in the patient's chart:   Medical and social history Use of alcohol, tobacco or illicit drugs  Current medications and supplements including opioid  prescriptions. Patient is not currently taking opioid prescriptions. Functional ability and status Nutritional status Physical activity Advanced directives List of other physicians Hospitalizations, surgeries, and ER visits in previous 12 months Vitals Screenings to include cognitive, depression, and falls Referrals and appointments  In addition, I have reviewed and discussed with patient certain preventive protocols, quality metrics, and best practice recommendations. A written personalized care plan for preventive services as well as general preventive health recommendations were provided to patient.   Angeline Fredericks, LPN   08/05/7972   After Visit Summary: (MyChart) Due to this being a telephonic visit, the after visit summary with patients personalized plan was offered to patient via MyChart   Notes: Nothing significant to report at this time.

## 2024-03-08 NOTE — Patient Instructions (Signed)
 Ms. Michele Hess , Thank you for taking time out of your busy schedule to complete your Annual Wellness Visit with me. I enjoyed our conversation and look forward to speaking with you again next year. I, as well as your care team,  appreciate your ongoing commitment to your health goals. Please review the following plan we discussed and let me know if I can assist you in the future. Your Game plan/ To Do List    Referrals: If you haven't heard from the office you've been referred to, please reach out to them at the phone provided.  Remember to update your flu and covid vaccines annually  Follow up Visits: We will see or speak with you next year for your Next Medicare AWV with our clinical staff 03/14/25 @ 11:30 Have you seen your provider in the last 6 months (3 months if uncontrolled diabetes)? Yes  Clinician Recommendations:  Aim for 30 minutes of exercise or brisk walking, 6-8 glasses of water, and 5 servings of fruits and vegetables each day.       This is a list of the screenings recommended for you:  Health Maintenance  Topic Date Due   COVID-19 Vaccine (7 - Moderna risk 2024-25 season) 10/02/2023   Flu Shot  03/03/2024   Mammogram  08/09/2024   Medicare Annual Wellness Visit  03/08/2025   Colon Cancer Screening  03/24/2026   DTaP/Tdap/Td vaccine (3 - Td or Tdap) 10/06/2028   Pneumococcal Vaccine for age over 23  Completed   Hepatitis B Vaccine  Completed   DEXA scan (bone density measurement)  Completed   Hepatitis C Screening  Completed   Zoster (Shingles) Vaccine  Completed   HPV Vaccine  Aged Out   Meningitis B Vaccine  Aged Out    Advanced directives: (Copy Requested) Please bring a copy of your health care power of attorney and living will to the office to be added to your chart at your convenience. You can mail to [facility] [address] [place], [place] [address] or email to [email or web address] Advance Care Planning is important because it:  [x]   Makes sure you receive the medical care that is consistent with your values, goals, and preferences  [x]  It provides guidance to your family and loved ones and reduces their decisional burden about whether or not they are making the right decisions based on your wishes.

## 2024-03-22 ENCOUNTER — Encounter: Payer: Self-pay | Admitting: Gastroenterology

## 2024-04-17 ENCOUNTER — Ambulatory Visit (INDEPENDENT_AMBULATORY_CARE_PROVIDER_SITE_OTHER): Admitting: Internal Medicine

## 2024-04-17 ENCOUNTER — Encounter: Payer: Self-pay | Admitting: Internal Medicine

## 2024-04-17 VITALS — BP 140/80 | HR 84 | Ht 62.5 in | Wt 139.4 lb

## 2024-04-17 DIAGNOSIS — E785 Hyperlipidemia, unspecified: Secondary | ICD-10-CM | POA: Diagnosis not present

## 2024-04-17 DIAGNOSIS — R7301 Impaired fasting glucose: Secondary | ICD-10-CM

## 2024-04-17 DIAGNOSIS — K0889 Other specified disorders of teeth and supporting structures: Secondary | ICD-10-CM | POA: Diagnosis not present

## 2024-04-17 DIAGNOSIS — I10 Essential (primary) hypertension: Secondary | ICD-10-CM | POA: Diagnosis not present

## 2024-04-17 DIAGNOSIS — Z0000 Encounter for general adult medical examination without abnormal findings: Secondary | ICD-10-CM

## 2024-04-17 DIAGNOSIS — R5383 Other fatigue: Secondary | ICD-10-CM | POA: Diagnosis not present

## 2024-04-17 MED ORDER — AMOXICILLIN-POT CLAVULANATE 875-125 MG PO TABS: 1.0000 | ORAL_TABLET | Freq: Two times a day (BID) | ORAL | 0 refills | Status: AC

## 2024-04-17 NOTE — Patient Instructions (Addendum)
 I appreciate your concern about long term use of omeprazole   in light of the recently published studies suggesting an association with increased risk of dementia and kidney failure.  I advise you to try  famotidine 20 mg once or twice daily,  This medication is an   H2 blocker and is  available without a prescription.   if your reflux symptoms are controlled,  You can Continue the daily h2 blocker.   I have sent an rx for augmentin  to your pharmacy to use if you develop pain and swelling of your right upper molar beucase you may have developed an abscess. If you start the antibiotic ,Daily use of a probiotic advised for 3 weeks.

## 2024-04-17 NOTE — Progress Notes (Unsigned)
 Patient ID: Michele Hess, female    DOB: 09/03/1955  Age: 68 y.o. MRN: [id number]  The patient is here for annual PREVENTIE examination and management of other chronic and acute problems.   The risk factors are reflected in the social history.  The roster of all physicians providing medical care to patient - is listed in the Snapshot section of the chart.  Activities of daily living:  The patient is 100% independent in all ADLs: dressing, toileting, feeding as well as independent mobility  Home safety : The patient has smoke detectors in the home. They wear seatbelts.  There are no firearms at home. There is no violence in the home.   There is no risks for hepatitis, STDs or HIV. There is no   history of blood transfusion. They have no travel history to infectious disease endemic areas of the world.  The patient has seen their dentist in the last six month. They have seen their eye doctor in the last year. They admit to slight hearing difficulty with regard to whispered voices and some television programs.  They have deferred audiologic testing in the last year.  They do not  have excessive sun exposure. Discussed the need for sun protection: hats, long sleeves and use of sunscreen if there is significant sun exposure.   Diet: the importance of a healthy diet is discussed. They do have a healthy diet.  The benefits of regular aerobic exercise were discussed. She walks 4 times per week ,  20 minutes.   Depression screen: there are no signs or vegative symptoms of depression- irritability, change in appetite, anhedonia, sadness/tearfullness.  Cognitive assessment: the patient manages all their financial and personal affairs and is actively engaged. They could relate day,date,year and events; recalled 2/3 objects at 3 minutes; performed clock-face test normally.  The following portions of the patient's history were reviewed and updated as appropriate: allergies, current medications, past family  history, past medical history,  past surgical history, past social history  and problem list.  Visual acuity was not assessed per patient preference since she has regular follow up with her ophthalmologist. Hearing and body mass index were assessed and reviewed.   During the course of the visit the patient was educated and counseled about appropriate screening and preventive services including : fall prevention , diabetes screening, nutrition counseling, colorectal cancer screening, and recommended immunizations.    CC: The primary encounter diagnosis was White coat syndrome with hypertension. Diagnoses of Hyperlipidemia LDL goal <70, Other fatigue, and Impaired fasting glucose were also pertinent to this visit. 1) htn:  2) hld:    3) GERD:  EGD done in June ,  BIOPSY NEGATIVE FOR H PYLORI AND CA . No longer taking PPI  after increasing to bid  with a wean  Getting derm  exam annually with DASHER,  EYES in April     History Salene has a past medical history of Anxiety, GERD (gastroesophageal reflux disease), History of ETT, HLD (hyperlipidemia), Hypertension, Hypertension (2023), Menopausal and postmenopausal disorder, Osteopenia, Seasonal allergies, Seizure (HCC) (01/2016), and Syncope and collapse (01/27/2016).   She has a past surgical history that includes Partial hysterectomy; echo (other) (04/2009); Total abdominal hysterectomy (1993); Anterior cruciate ligament repair (Left, 2011); Tonsillectomy (1961); Colonoscopy (2019); Polypectomy (2019); Wisdom tooth extraction; Pelvic laparoscopy (8012); and Lumbar laminectomy (10/2020).   Her family history includes Cancer (age of onset: 31) in her brother; Diabetes in her brother and mother; Familial polyposis in her brother; Heart attack (age  of onset: 56) in her mother; Heart attack (age of onset: 17) in her father; Heart attack (age of onset: 3) in her brother; Heart failure in her brother; Lung cancer in her brother.She reports that she quit  smoking about 36 years ago. Her smoking use included cigarettes. She started smoking about 53 years ago. She has a 17 pack-year smoking history. She has never used smokeless tobacco. She reports that she does not currently use alcohol after a past usage of about 5.0 standard drinks of alcohol per week. She reports that she does not use drugs.  Outpatient Medications Prior to Visit  Medication Sig Dispense Refill   amLODipine  (NORVASC ) 2.5 MG tablet TAKE 1 TABLET BY MOUTH EVERY DAY 90 tablet 3   aspirin EC 81 MG tablet Take 81 mg by mouth. Swallow whole. Twice weekly     Calcium  Carb-Cholecalciferol (CALCIUM  600 + D PO) Take 1 tablet by mouth daily.     diclofenac  Sodium (VOLTAREN ) 1 % GEL Apply 4 g topically 3 (three) times daily as needed. 50 g 0   ezetimibe  (ZETIA ) 10 MG tablet TAKE 1 TABLET BY MOUTH EVERY DAY 90 tablet 3   rosuvastatin  (CRESTOR ) 20 MG tablet Take 1 tablet (20 mg total) by mouth daily. 90 tablet 3   UNABLE TO FIND Take 1 tablet by mouth daily. Nature Made Calcium  1200 mg and Vitamin D3 1000 mg     omeprazole  (PRILOSEC) 20 MG capsule Take 1 capsule (20 mg total) by mouth daily. Take 30 minutes before breakfast. (Patient not taking: Reported on 04/17/2024) 30 capsule 1   Lactobacillus (PROBIOTIC ACIDOPHILUS PO) Take 1 capsule by mouth daily. (Patient not taking: Reported on 03/08/2024)     omeprazole  (PRILOSEC) 20 MG capsule Take 1 capsule (20 mg total) by mouth 2 (two) times daily before a meal. 180 capsule 3   sucralfate  (CARAFATE ) 1 GM/10ML suspension TAKE 10 MLS (1 G TOTAL) BY MOUTH NIGHTLY. TAKE NIGHTLY, AND AS NEEDED (Patient not taking: Reported on 04/17/2024) 900 mL 1   Facility-Administered Medications Prior to Visit  Medication Dose Route Frequency Provider Last Rate Last Admin   0.9 %  sodium chloride  infusion  500 mL Intravenous Once Armbruster, Elspeth SQUIBB, MD        Review of Systems  Objective:  BP (!) 140/80   Pulse 84   Ht 5' 2.5 (1.588 m)   Wt 139 lb 6.4  oz (63.2 kg)   SpO2 98%   BMI 25.09 kg/m   Physical Exam  Assessment & Plan:  White coat syndrome with hypertension  Hyperlipidemia LDL goal <70  Other fatigue  Impaired fasting glucose      I provided 40 minutes of  face-to-face time during this encounter reviewing patient's current problems and past surgeries,  recent labs and imaging studies, providing counseling on the above mentioned problems , and coordination  of care .   Follow-up: No follow-ups on file.   Verneita LITTIE Kettering, MD

## 2024-04-18 DIAGNOSIS — K0889 Other specified disorders of teeth and supporting structures: Secondary | ICD-10-CM | POA: Insufficient documentation

## 2024-04-18 NOTE — Assessment & Plan Note (Signed)

## 2024-04-18 NOTE — Assessment & Plan Note (Signed)
 Rx for augmentin  to use if tooth pain develops into abscess

## 2024-04-18 NOTE — Assessment & Plan Note (Signed)
 Reviewed Cardiac CT from 2023 Continue Crestor , Zetia   and continue  baby asa 2/week .    Lab Results  Component Value Date   CHOL 161 06/01/2023   HDL 72 06/01/2023   LDLCALC 68 06/01/2023   LDLDIRECT 78.0 11/09/2022   TRIG 128 06/01/2023   CHOLHDL 2.2 06/01/2023

## 2024-04-18 NOTE — Assessment & Plan Note (Signed)
 HOME READINGS HAVE BEEN 130/80 or less   ON AMLODIPINE  2.5 MG DAILY.     Continue low dose amlodipine 

## 2024-05-02 ENCOUNTER — Other Ambulatory Visit (INDEPENDENT_AMBULATORY_CARE_PROVIDER_SITE_OTHER)

## 2024-05-02 DIAGNOSIS — I10 Essential (primary) hypertension: Secondary | ICD-10-CM | POA: Diagnosis not present

## 2024-05-02 DIAGNOSIS — R7301 Impaired fasting glucose: Secondary | ICD-10-CM

## 2024-05-02 DIAGNOSIS — R5383 Other fatigue: Secondary | ICD-10-CM

## 2024-05-02 DIAGNOSIS — E785 Hyperlipidemia, unspecified: Secondary | ICD-10-CM

## 2024-05-02 LAB — MICROALBUMIN / CREATININE URINE RATIO
Creatinine,U: 9.8 mg/dL
Microalb Creat Ratio: UNDETERMINED mg/g (ref 0.0–30.0)
Microalb, Ur: <0.7 mg/dL

## 2024-05-02 LAB — COMPREHENSIVE METABOLIC PANEL WITH GFR
ALT: 11 U/L (ref 0–35)
AST: 25 U/L (ref 0–37)
Albumin: 4.7 g/dL (ref 3.5–5.2)
Alkaline Phosphatase: 55 U/L (ref 39–117)
BUN: 12 mg/dL (ref 6–23)
CO2: 28 meq/L (ref 19–32)
Calcium: 9.8 mg/dL (ref 8.4–10.5)
Chloride: 100 meq/L (ref 96–112)
Creatinine, Ser: 0.55 mg/dL (ref 0.40–1.20)
GFR: 94.9 mL/min (ref >60.00)
Glucose, Bld: 100 mg/dL — ABNORMAL HIGH (ref 70–99)
Potassium: 4.2 meq/L (ref 3.5–5.1)
Sodium: 136 meq/L (ref 135–145)
Total Bilirubin: 0.5 mg/dL (ref 0.2–1.2)
Total Protein: 7.1 g/dL (ref 6.0–8.3)

## 2024-05-02 LAB — LIPID PANEL
Cholesterol: 151 mg/dL (ref 0–200)
HDL: 67.5 mg/dL (ref >39.00)
LDL Cholesterol: 61 mg/dL (ref 0–99)
NonHDL: 83.67
Total CHOL/HDL Ratio: 2
Triglycerides: 115 mg/dL (ref 0.0–149.0)
VLDL: 23 mg/dL (ref 0.0–40.0)

## 2024-05-02 LAB — CBC WITH DIFFERENTIAL/PLATELET
Basophils Absolute: 0 K/uL (ref 0.0–0.1)
Basophils Relative: 0.5 % (ref 0.0–3.0)
Eosinophils Absolute: 0.1 K/uL (ref 0.0–0.7)
Eosinophils Relative: 2 % (ref 0.0–5.0)
HCT: 40.8 % (ref 36.0–46.0)
Hemoglobin: 13.3 g/dL (ref 12.0–15.0)
Lymphocytes Relative: 40.6 % (ref 12.0–46.0)
Lymphs Abs: 2.4 K/uL (ref 0.7–4.0)
MCHC: 32.7 g/dL (ref 30.0–36.0)
MCV: 88.3 fl (ref 78.0–100.0)
Monocytes Absolute: 0.5 K/uL (ref 0.1–1.0)
Monocytes Relative: 9.3 % (ref 3.0–12.0)
Neutro Abs: 2.8 K/uL (ref 1.4–7.7)
Neutrophils Relative %: 47.6 % (ref 43.0–77.0)
Platelets: 349 K/uL (ref 150.0–400.0)
RBC: 4.62 Mil/uL (ref 3.87–5.11)
RDW: 13.8 % (ref 11.5–15.5)
WBC: 5.8 K/uL (ref 4.0–10.5)

## 2024-05-02 LAB — LDL CHOLESTEROL, DIRECT: Direct LDL: 60 mg/dL

## 2024-05-02 LAB — TSH: TSH: 2.22 u[IU]/mL (ref 0.35–5.50)

## 2024-05-02 LAB — HEMOGLOBIN A1C: Hgb A1c MFr Bld: 6.1 % (ref 4.6–6.5)

## 2024-05-03 ENCOUNTER — Ambulatory Visit: Payer: Self-pay | Admitting: Internal Medicine

## 2024-05-22 ENCOUNTER — Encounter: Admitting: Internal Medicine

## 2024-07-28 ENCOUNTER — Other Ambulatory Visit: Payer: Self-pay | Admitting: Internal Medicine

## 2024-07-28 DIAGNOSIS — Z1231 Encounter for screening mammogram for malignant neoplasm of breast: Secondary | ICD-10-CM

## 2024-08-22 ENCOUNTER — Ambulatory Visit
Admission: RE | Admit: 2024-08-22 | Discharge: 2024-08-22 | Disposition: A | Discharge status: Home / Self Care | Source: Ambulatory Visit | Attending: Internal Medicine | Admitting: Internal Medicine

## 2024-08-22 DIAGNOSIS — Z1231 Encounter for screening mammogram for malignant neoplasm of breast: Secondary | ICD-10-CM | POA: Insufficient documentation

## 2024-09-04 ENCOUNTER — Encounter: Payer: Self-pay | Admitting: Internal Medicine

## 2024-10-20 ENCOUNTER — Ambulatory Visit: Admitting: Cardiovascular Disease

## 2025-03-14 ENCOUNTER — Ambulatory Visit
